# Patient Record
Sex: Female | Born: 1935 | Race: White | Hispanic: No | Marital: Married | Smoking: Never smoker
Health system: Southern US, Community
[De-identification: ages and names within clinical notes are randomized; demographics above are authoritative.]

## PROBLEM LIST (undated history)

## (undated) DIAGNOSIS — I10 Essential (primary) hypertension: Secondary | ICD-10-CM

## (undated) DIAGNOSIS — R7303 Prediabetes: Secondary | ICD-10-CM

## (undated) DIAGNOSIS — K5792 Diverticulitis of intestine, part unspecified, without perforation or abscess without bleeding: Secondary | ICD-10-CM

## (undated) DIAGNOSIS — M51369 Other intervertebral disc degeneration, lumbar region without mention of lumbar back pain or lower extremity pain: Secondary | ICD-10-CM

## (undated) DIAGNOSIS — L920 Granuloma annulare: Secondary | ICD-10-CM

## (undated) DIAGNOSIS — M810 Age-related osteoporosis without current pathological fracture: Secondary | ICD-10-CM

## (undated) DIAGNOSIS — M359 Systemic involvement of connective tissue, unspecified: Secondary | ICD-10-CM

## (undated) DIAGNOSIS — M5136 Other intervertebral disc degeneration, lumbar region: Secondary | ICD-10-CM

## (undated) HISTORY — PX: BACK SURGERY: SHX140

## (undated) HISTORY — PX: ABDOMINAL HYSTERECTOMY: SHX81

---

## 2008-11-04 ENCOUNTER — Encounter: Payer: Self-pay | Admitting: Family Medicine

## 2008-12-03 ENCOUNTER — Encounter: Payer: Self-pay | Admitting: Family Medicine

## 2009-01-02 ENCOUNTER — Encounter: Payer: Self-pay | Admitting: Family Medicine

## 2014-06-10 ENCOUNTER — Inpatient Hospital Stay: Payer: Self-pay | Admitting: Internal Medicine

## 2014-06-26 ENCOUNTER — Ambulatory Visit: Payer: Self-pay | Admitting: Gastroenterology

## 2014-08-03 NOTE — Consult Note (Signed)
PATIENT NAME:  Janet Duffy, Janet Duffy MR#:  [id number] DATE OF BIRTH:  04-14-1931  DATE OF CONSULTATION:  06/07/2014  CONSULTING PHYSICIAN:  Lollie Sails, MD  REASON FOR CONSULTATION: Rectal bleeding.   HISTORY OF PRESENT ILLNESS: Janet Duffy is an 81 year old Caucasian female who was in her usual state of health until about 10:30 p.m. last night. At that point she went to the restroom and had an episode of rectal bleeding. This was followed by several further episodes. Her last bowel movement was about 12:30 this morning, therefore, a little past midnight. She states that the rectal bleeding was bright red. She denies any nausea or vomiting. She does get some occasional heartburn. There is no dysphagia. She states that she has had some off and on lower right abdominal discomfort for about three weeks. She stated that she had no severe abdominal discomfort prior to the bleeding beginning, however, has had the same lower abdominal discomfort.  She states her bowel movements are usually twice a day. She has had some bleeding with a bowel movement in the past in regards to her history of hemorrhoids. Indeed, she underwent a flexible sigmoidoscopy about five months ago then followed by another about three weeks ago and at that time did undergo internal hemorrhoidal banding. She stated she had no bleeding after the procedure. The first bleeding was this yesterday. She also has had a full colonoscopy in the past. The last one being about three years ago, this showing some colon polyps as well as diverticulosis.   The patient also has a history of atrial fibrillation; however, does not take any blood thinner with the exception of an 81 mg aspirin. She states, however, this was discontinued several days prior to her hemorrhoidal banding and she has not restarted it. She takes a Prilosec occasionally. 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 She states that in the past she has had two blood clots; these in her fingers and occurring many years ago. She further relates having some aortic disease, but could not elucidate that further. She has been hemodynamically stable and has had no bowel movement or evidence of ongoing bleeding since a little past midnight, that being over 16 hours ago.   GASTROINTESTINAL FAMILY HISTORY: Pertinent for a brother with colon cancer. Negative for liver disease or ulcers.   PAST MEDICAL HISTORY:  1.  History of aortic insufficiency.  2.  Osteopenia.  3.  Hypertension.  4.  Osteoarthritis.  5.  PAT.  6.  Diverticulosis.  7.  Hysterectomy.  8.  Femur surgery.  9.  Cataract surgery.  10.  Kyphoplasty.   OUTPATIENT MEDICATIONS: Include the following:  1.  Aspirin 81 mg aspirin once a day, please note above.  2.  Carvedilol 12.5 mg twice a day.  3.  Chlorthalidone 25 mg 1/2 tablet once a day as needed for swelling.  4.  Felodipine 2.5 mg ER once a day.  5.  Lisinopril 20 mg daily.  6.  Metronidazole topical gel.  7.  Multiple vitamin.  8.  Nitroglycerin 2% transdermal ointment to be used on the affected fingers twice a day for Raynauds syndrome.  9.  Omeprazole 20 mg twice a day.  10.  Ondansetron 4 mg twice a day before meals.  11.  Potassium chloride 20 mEq once a day.  12.  Psyllium fiber 1 capsule daily.   PHYSICAL EXAMINATION:  VITAL SIGNS:  Temperature 97.8,  pulse 77, respirations 18, blood pressure 158/81, pulse ox 94%.  GENERAL: She is an 81 year old Caucasian female no acute distress.  HEENT: Normocephalic, atraumatic.  EYES: Anicteric.  NOSE: Septum midline. No lesions.  OROPHARYNX: No lesions.  NECK: No JVD.  HEART: Regular rate and rhythm.  LUNGS: Clear.  ABDOMEN: Soft. There is some mild tenderness across the lower abdomen, mostly in the right lower quadrant. There are no masses, rebound, or organomegaly.  RECTAL: Anorectal exam deferred.  EXTREMITIES: No clubbing,  cyanosis, or edema.  NEUROLOGICAL: Cranial nerves II through XII grossly intact. Muscle strength bilaterally equal and symmetric.   LABORATORY DIAGNOSTIC AND RADIOLOGICAL DATA:  Include the following:  Late last night metabolic panel showing glucose of 153, otherwise normal. Hepatic profile showing a total protein of 8, albumin 2.8, total bilirubin 0.1, alkaline phosphatase 85, AST 24, ALT 13. Her hemogram last night showing a white count of 4.4, H and H 10.0/30.2, platelet count 215. MCV is 85. She has had repeat hemoglobins done at 7:55 this morning and at 1600 hours this afternoon, these being 8.4 and 8.9 respectively. Her INR was 1.  She has had no imaging studies.   ASSESSMENT:  1.  Hematochezia/bright red rectal bleeding. Patient with recent hemorrhoidal banding. She has had no recurrent bleeding for approximately 17 hours at this point.  2.  Differential diagnosis would include recurrent anal outlet bleeding from hemorrhoids, diverticular bleeding in the setting of known history of diverticulosis, possible episode of ischemic colitis. Please note histories as above.   RECOMMENDATION:  1.  Continue serial hemoglobins. Transfuse as needed.  2.  If patient has another episode of acute bleeding, would obtain a stat GI bleeding scan. This should easily differentiate between diverticular versus anal/rectal bleeding. If there is evidence of the bleeding being diverticular in nature would then obtain a vascular surgery consult.  3.  May need to consider flexible sigmoidoscopy prior to discharge from the hospital or within the next couple of weeks.  4.  Would consider further evaluations in regards to her history of lupus like syndrome and Raynauds to include anticardiolipin antibodies, protein C and S panel, antithrombin III as well as Leiden factor V.     ____________________________ Lollie Sails, MD mus:at D: 06/07/2014 17:31:37 ET T: 06/07/2014 17:43:17 ET JOB#: [id number]  cc: Lollie Sails, MD, <Dictator> Lollie Sails MD ELECTRONICALLY SIGNED 07/15/2014 14:25

## 2014-08-03 NOTE — Consult Note (Signed)
Chief Complaint:  Subjective/Chief Complaint Patietn discussed with Dr Earleen Newport.  Patietn with another bloody stool overnight and a small drop og hgb.  Patient with syncopal eposode this am with enema.  Recommend holding the plans for flex sig for now, if patietn has another bleeding episode, recommend bleeding scan.   Electronic Signatures: Loistine Simas (MD)  (Signed 07-Mar-16 09:52)  Authored: Chief Complaint   Last Updated: 07-Mar-16 09:52 by Loistine Simas (MD)

## 2014-08-03 NOTE — Consult Note (Signed)
Chief Complaint:  Subjective/Chief Complaint small bm this am, old blood.  no repeat today. continues with mild lower abdominal discomfort.  no nausea of emesis.   VITAL SIGNS/ANCILLARY NOTES: **Vital Signs.:   06-Mar-16 07:34  Vital Signs Type Q 8hr  Temperature Temperature (F) 98.1  Temperature Source oral  Pulse Pulse 80  Respirations Respirations 17  Systolic BP Systolic BP 094  Diastolic BP (mmHg) Diastolic BP (mmHg) 74  Mean BP 98  Pulse Ox % Pulse Ox % 96  Pulse Ox Activity Level  At rest  Oxygen Delivery Room Air/ 21 %   Brief Assessment:  Cardiac Regular   Respiratory clear BS   Gastrointestinal details normal Soft  Nondistended  No masses palpable  Bowel sounds normal  No rebound tenderness  tender to palpation across the lower abdomen, mostly rlq   Lab Results: Routine Chem:  06-Mar-16 04:14   BUN 11  Creatinine (comp) 0.92  Sodium, Serum 142  Potassium, Serum  3.4  Chloride, Serum 105  CO2, Serum 27  Calcium (Total), Serum 8.5  Anion Gap 10  Osmolality (calc) 283  eGFR (African American) >60  eGFR (Non-African American) >60 (eGFR values <12m/min/1.73 m2 may be an indication of chronic kidney disease (CKD). Calculated eGFR, using the MRDR Study equation, is useful in  patients with stable renal function. The eGFR calculation will not be reliable in acutely ill patients when serum creatinine is changing rapidly. It is not useful in patients on dialysis. The eGFR calculation may not be applicable to patients at the low and high extremes of body sizes, pregnant women, and vegetarians.)  Routine Hem:  06-Mar-16 04:14   WBC (CBC)  2.9  RBC (CBC)  3.14  Hemoglobin (CBC)  8.8  Hematocrit (CBC)  26.6  Platelet Count (CBC) 179  MCV 85  MCH 28.0  MCHC 33.1  RDW  16.0  Neutrophil % 39.7  Lymphocyte % 40.1  Monocyte % 15.0  Eosinophil % 3.6  Basophil % 1.6  Neutrophil #  1.2  Lymphocyte # 1.2  Monocyte # 0.4  Eosinophil # 0.1  Basophil # 0.0  (Result(s) reported on 08 Jun 2014 at 05:16AM.)   Assessment/Plan:  Assessment/Plan:  Assessment 1) hematochezia, lower abdominal pain/discomfort.  stable, no recurrent bleeding.  DDx- hemorrhoidal, diverticular, possible ischemic colitis-note pain before episode.   Plan 1) continue to monitor through tomorrow, may do flexible sigmoidoscopy tomorrow depending on clinical status.   Continue current.   Electronic Signatures: SLoistine Simas(MD)  (Signed 06-Mar-16 17:00)  Authored: Chief Complaint, VITAL SIGNS/ANCILLARY NOTES, Brief Assessment, Lab Results, Assessment/Plan   Last Updated: 06-Mar-16 17:00 by SLoistine Simas(MD)

## 2014-08-03 NOTE — H&P (Signed)
PATIENT NAME:  Janet Duffy, Janet Duffy MR#:  [id number] DATE OF BIRTH:  1931/10/06  DATE OF ADMISSION:  06/07/2014  CHIEF COMPLAINT: Rectal bleed.   HISTORY OF PRESENT ILLNESS: This is an 81 year old female with minimal significant medical history who presents to the ED after a significant amount of bright red blood per rectum is noted. The patient states that throughout the day today she has had 3 stools with blood in them, 2 of them were large volume at home and a third smaller volume one was here in the ED. The patient states that her last regular bowel movement was the morning of admission, but that subsequent to that she felt a strong desire to void and went to the bathroom and had large volume of bright red blood per rectum that she felt like came out in bolus from her rectum. The patient states that she does have some mild abdominal pain sort of diffusely today, but she denies any melena. Denies any nausea or vomiting. States that she has not recently use any nonsteroidal anti-inflammatories or steroids, and that she has had is dull, constant right upper quadrant pain for a couple of weeks, which has slowly been improving. The patient also stopped taking her Prilosec no too long ago. In the ED,  the patient was found to be mildly anemic with a hemoglobin of 10. Electrolytes and blood work were otherwise relatively benign. The patient was typed and screened. Her INR was 1.0. Hospitalists were called for admission with the plan to admit the patient and have GI following and consult for the GI bleed.   PRIMARY CARE PHYSICIAN: Nonlocal.   PAST MEDICAL HISTORY: Includes hemorrhoids for which she states that she had a procedure done 3 weeks ago at [facility] for hemorrhoid banding. Past medical history also includes paroxysmal atrial fibrillation, aortic insufficiency, osteopenia, hypertension, osteoarthritis, and diverticulosis.   CURRENT MEDICATIONS: Potassium 20 mEq, Zofran 4 mg q. 6-8 hours p.r.n., lisinopril 20 mg  daily, felodipine 2.5 mg daily, chlorthalidone 12.5 mg daily, Coreg 12.5 mg b.i.d., aspirin 81 mg daily.   PAST SURGICAL HISTORY: Hysterectomy, femur surgery, cataract removal, kyphoplasty.   ALLERGIES: AVELOX, ERYTHROMYCIN, PENICILLIN, CLINDAMYCIN, SEAFOOD, CODEINE, ZOSTAVAX, NORVASC.   FAMILY HISTORY: Includes hypertension, cancer.   SOCIAL HISTORY: The patient is a nonsmoker, nondrinker. Denies any illicit drug use.   REVIEW OF SYSTEMS:  CONSTITUTIONAL: The patient denies fever, fatigue, weakness.  EYES: The patient denies blurred or double vision, pain or redness.  EAR, NOSE, AND THROAT: Denies hearing loss, ear pain, difficulty swallowing.  RESPIRATORY: Denies cough, wheeze, dyspnea.  CARDIOVASCULAR: Denies chest pain or palpitations.  GASTROINTESTINAL: The patient endorses abdominal pain and bright red blood per rectum. Denies nausea, vomiting, and GERD.  GENITOURINARY: The patient denies dysuria, hematuria, or frequency.  HEMATOLOGIC AND LYMPHATIC: The patient denies easy bruising, easy bleeding, and swollen glands.  INTEGUMENTARY: The patient denies acne, rash, lesion.  MUSCULOSKELETAL: The patient denies arthritis, swelling, and gout.  NEUROLOGICAL: The patient denies numbness, weakness, and headache.  PSYCHIATRIC: The patient denies anxiety, insomnia, and depression.   PHYSICAL EXAMINATION:   VITAL SIGNS: Blood pressure 149/78, pulse 86, temperature 97.1, respirations 18, 95% on room air.  HEENT: Pupils equal, round, and reactive to light and accommodation. Extraocular movements intact. No scleral icterus. Moist mucosal membranes.  NECK: Thyroid is normal and not enlarged. Neck is supple with no masses, nontender. Right adenopathy. No JVD noted. No cervical adenopathy.  RESPIRATORY: Clear to auscultation bilaterally with no rales, rhonchi, or wheezing.  CARDIOVASCULAR: Regular rate and rhythm. No murmur, rub, or gallop detected on exam. Good pedal pulses. No lower extremity  edema.  ABDOMEN: Soft with mild tenderness to palpation in the right upper quadrant, nondistended with good bowel sounds. No hepatosplenomegaly.  MUSCULOSKELETAL: Muscular strength 5/5 throughout. Full spontaneous range of motion throughout. No peripheral cyanosis or clubbing.  SKIN: No rash or lesions. Skin is warm, dry, and intact.  LYMPHATIC: No adenopathy.  NEUROLOGICAL: Cranial nerves intact. Sensation intact throughout. No dysarthria or aphasia.  PSYCHIATRIC: The patient is alert and oriented x 3, cooperative, shows good insight and judgment.   LABORATORY DATA: White blood count 4.4, hemoglobin 10, hematocrit 30.2, platelet 215,000. Sodium 140, potassium 3.6, chloride 103, bicarbonate 28, BUN 14, creatinine 0.85, glucose 153. Total protein 8.0, serum albumin 2.8, total bilirubin 0.1, alkaline phosphatase 85, AST 24, ALT 13. INR was 1.   RADIOLOGY: No radiographic imaging performed at this time.   ASSESSMENT AND PLAN:  1.  Gastrointestinal bleed: Unclear etiology at this time. Bright red blood per rectum seems to suggest lower gastrointestinal bleed. The patient does have a known history of diverticulosis; however, it is unclear at this time exactly where the bleed coming from. We will start patient on intravenous proton pump inhibitor drip, get a gastroenterology consult for the morning. This patient will be kept n.p.o. for now.  2.  Paroxysmal atrial fibrillation: The patient is currently rate controlled with the medicine that she is on. We will continue these medications for rate control.  3.  Hypertension: This problem is stable. We will continue current home medications  4.  Deep vein thrombosis prophylaxis: With sequential compression devices.   CODE STATUS: This patient is full code.   TIME SPENT ON THIS ADMISSION: 45 minutes.    ____________________________ Wilford Corner. Jannifer Franklin, MD dfw:bm D: 06/07/2014 03:54:44 ET T: 06/07/2014 04:42:36 ET JOB#: [id number]  cc: Wilford Corner. Jannifer Franklin, MD,  <Dictator> Libbie Bartley Fawn Kirk MD ELECTRONICALLY SIGNED 06/07/2014 20:42

## 2014-08-03 NOTE — Consult Note (Signed)
Chief Complaint:  Subjective/Chief Complaint no further rectal bleeding, mild lower abdominal discomfort continuing, though improved.  no nausea, tolerating po without nausea or increased pain.   VITAL SIGNS/ANCILLARY NOTES: **Vital Signs.:   08-Mar-16 15:44  Vital Signs Type Q 8hr  Celsius 36.8  Temperature Source oral  Pulse Pulse 73  Respirations Respirations 18  Systolic BP Systolic BP 697  Diastolic BP (mmHg) Diastolic BP (mmHg) 74  Mean BP 93  Pulse Ox % Pulse Ox % 96  Pulse Ox Activity Level  At rest  Oxygen Delivery Room Air/ 21 %   Brief Assessment:  Cardiac Regular   Respiratory clear BS   Gastrointestinal details normal Soft  Nondistended  No masses palpable  Bowel sounds normal  No rebound tenderness  mild tenderness to palpation across the lower abdomen, mostly llq.   Lab Results: Routine BB:  08-Mar-16 09:15   ABO Group + Rh Type O Positive  Antibody Screen NEGATIVE (Result(s) reported on 10 Jun 2014 at 10:22AM.)  Crossmatch Unit 1 Issued (Result(s) reported on 10 Jun 2014 at 11:30AM.)  Routine Hem:  04-Mar-16 23:58   Hemoglobin (CBC)  10.0  05-Mar-16 07:55   Hemoglobin (CBC)  8.4 (Result(s) reported on 07 Jun 2014 at 08:15AM.)    16:00   Hemoglobin (CBC)  8.9 (Result(s) reported on 07 Jun 2014 at 05:04PM.)  06-Mar-16 04:14   Hemoglobin (CBC)  8.8  07-Mar-16 04:27   Hemoglobin (CBC)  8.2 (Result(s) reported on 09 Jun 2014 at 05:51AM.)  08-Mar-16 04:09   WBC (CBC) 5.6  RBC (CBC)  2.83  Hemoglobin (CBC)  7.8  Hematocrit (CBC)  23.9  Platelet Count (CBC) 181  MCV 85  MCH 27.4  MCHC 32.4  RDW  16.2  Neutrophil % 62.9  Lymphocyte % 22.8  Monocyte % 11.9  Eosinophil % 1.8  Basophil % 0.6  Neutrophil # 3.5  Lymphocyte # 1.3  Monocyte # 0.7  Eosinophil # 0.1  Basophil # 0.0 (Result(s) reported on 10 Jun 2014 at 05:50AM.)   Assessment/Plan:  Assessment/Plan:  Assessment 1) rectal bleeding-resolved. 2) lower abdominal pain improving.  recommend  ct with contrast.  this can be done in o/p GI followup.  3) recent hemorrhoidal banding, has had 2 flexible sig in the past 6 months and last full colonoscopy was 3 and a half years ago, with recommendation to repeat in 5 years.   Plan as above.   Electronic Signatures: Loistine Simas (MD)  (Signed 08-Mar-16 19:32)  Authored: Chief Complaint, VITAL SIGNS/ANCILLARY NOTES, Brief Assessment, Lab Results, Assessment/Plan   Last Updated: 08-Mar-16 19:32 by Loistine Simas (MD)

## 2014-08-03 NOTE — Discharge Summary (Signed)
PATIENT NAME:  Janet Duffy, SUTPHIN MR#:  [id number] DATE OF BIRTH:  1931/11/25  DATE OF ADMISSION:  06/10/2014 DATE OF DISCHARGE:  06/11/2014  ADMITTING DIAGNOSIS: Gastrointestinal bleed.  DISCHARGE DIAGNOSES: 1.  Acute posthemorrhagic anemia, status post 1 unit of packed red blood cell transfusion.  2.  Lower gastrointestinal bleed, suspected diverticular.  3.  Syncope with enema. 4.  History of essential hypertension. 5.  Paroxysmal atrial fibrillation. 6.  Aortic regurgitation.  7.  Osteopenia. 8.  Osteoarthritis. 9.  Diverticulosis. 10.  Status post recent hemorrhoidal banding.   DISCHARGE CONDITION: Stable.   DISCHARGE MEDICATIONS:  1.  The patient is to continue Coreg 12.5 mg twice daily.  2.  Psyllium oral capsule, unknown dose, once daily. 3.  Lisinopril 10 mg p.o. daily. 4.  Metronidazole topical gel 0.75% to effected area as needed.  5.  Multivitamins once daily. 6.  Nitroglycerin transdermal ointment 2% on affected fingers twice daily for Raynaud's.   7.  Omeprazole 10 mg p.o. twice daily.  8.  Zofran 4 mg every 6 to 8 hours as needed.  9.  Felodipine extended-release 2.5 mg once daily.  10.  Senna 1 tablet twice daily disease as needed.  11.  Docusate sodium 100 mg twice daily as needed.  12.  Fergon 240 mg twice daily.   The patient is not to take aspirin, chlorthalidone, or potassium supplements unless recommended by primary care physician.   HOME OXYGEN: None.   DIET: Two gram salt, regular consistency.   ACTIVITY LIMITATIONS: As tolerated.    FOLLOWUP APPOINTMENT: With Dr. Almeta Monas at [facility] in 1 week after discharge. Dr. Gustavo Lah in 2 weeks after discharge.   CONSULTANTS: Care management, social work, Dr. Gustavo Lah.   RADIOLOGIC STUDIES: None.   HOSPITAL COURSE: The patient is an 81 year old Caucasian female with past medical history significant for history of diverticular disease, who presents to the hospital with complaints of rectal bleed. Please refer to  Dr. Tobey Grim admission note on 06/07/2014. On arrival to the hospital, the patient's temperature was 97.1, her pulse was 86, respiration rate was 18, blood pressure 149/78, saturation was 95% on room air. Physical examination revealed mild tenderness to palpation in the right upper quadrant but no other abnormalities were found. The patient's laboratory data done on arrival to the Emergency Room showed elevation of glucose of 153, otherwise BMP was normal. Liver enzymes revealed albumin level of 2.8. A total bilirubin was 0.1, otherwise liver enzymes were normal. CBC within normal limits with white blood cell count 4.4, hemoglobin 10.0, platelet count 215,000. Coagulation panel was unremarkable with pro time 13.3 and INR 1.0. EKG showed normal sinus rhythm at 98 beats a minute, nonspecific ST-T changes. The patient was admitted to the hospital for further evaluation. Consultation with Dr. Gustavo Lah, gastroenterologist, was obtained. Dr. Gustavo Lah saw the patient in consultation the same day, 06/07/2014. He felt that the patient had hematochezia, and she is hemodynamically stable with no recurrence for the past 16 to 17 hours. Differential diagnosis according to Dr. Gustavo Lah was anorectal bleeding from hemorrhoids, or less likely diverticular disease. Also, some possibility of ischemic colitis. Recommended to get bleeding scan if bleeding recurs and follow hemoglobin levels closely. The patient was admitted to the hospital for further evaluation. She was given IV fluids and her hemoglobin level was followed along. With rehydration however, the patient's hemoglobin level drifted down to 8.2 on 06/09/2014 and even as low at 7.8 on 06/10/2014. At that point, the patient was transfused with 1 unit  of packed red blood cells, since she felt quite poorly, weak, and uncomfortable. After transfusion, her hemoglobin level improved to 9.0. The patient was advised to continue iron supplements and follow her hemoglobin levels as  outpatient. The patient was evaluated by gastroenterologist, as mentioned above, and recommended flexible sigmoidoscopy. Unfortunately with enema, the patient developed a syncopal episode and her study was postponed to outpatient The patient is to follow up with her gastroenterologist in the next 1 to 2 weeks after discharge for further recommendations and follow her hemoglobin level as an outpatient. She is to continue iron supplementation and hold aspirin until recommended by primary care physician.   In regard to her chronic medical problems such as essential hypertension, paroxysmal atrial fibrillation, osteopenia, osteoarthritis, the patient is to continue her outpatient management. No changes were made.   The patient is being discharged in stable condition with the above-mentioned medications and followup. On the day of discharge, temperature was 98.3, pulse was 74, respiration rate was 18, blood pressure 153/74, saturation 93% to 95% on room air at rest.   TIME SPENT: Forty minutes.    ____________________________ Theodoro Grist, MD rv:TM D: 06/11/2014 16:51:00 ET T: 06/11/2014 23:59:46 ET JOB#: [id number]  cc: Almeta Monas, MD Lollie Sails, MD Theodoro Grist, MD, <Dictator> [place] MD ELECTRONICALLY SIGNED 06/18/2014 14:18

## 2014-08-03 NOTE — Consult Note (Signed)
Brief Consult Note: Diagnosis: hematochezia.   Patient was seen by consultant.   Consult note dictated.   Recommend further assessment or treatment.   Comments: Please see full GI copnsutl #[id number].  Patient presenting with rectal bleeding .  Hemodynamically stable, not recurrent for about 16-17 hours.  DDx includes anorectal bleedign a/w hemorhoids or treatment thereof, though less likely, diverticular, also some possibility of ischemic colitis (note history).  Recommend stat bleeding scan if recurrent bleeding occurs. Serial hgb, tfx as needed, following.  Electronic Signatures: Loistine Simas (MD)  (Signed 05-Mar-16 17:34)  Authored: Brief Consult Note   Last Updated: 05-Mar-16 17:34 by Loistine Simas (MD)

## 2014-08-03 NOTE — Consult Note (Signed)
Chief Complaint:  Subjective/Chief Complaint events noted, hemodynamically stable, bm after enema not bloody times 3.  less abdominal discomfort. feels weak.   VITAL SIGNS/ANCILLARY NOTES: **Vital Signs.:   07-Mar-16 07:49  Vital Signs Type Q 8hr  Temperature Temperature (F) 98.2  Celsius 36.7  Temperature Source oral  Pulse Pulse 76  Respirations Respirations 17  Systolic BP Systolic BP 646  Diastolic BP (mmHg) Diastolic BP (mmHg) 74  Mean BP 100  Pulse Ox % Pulse Ox % 96  Pulse Ox Activity Level  At rest  Oxygen Delivery Room Air/ 21 %    07:55  Pulse Pulse 98  Respirations Respirations 20  Systolic BP Systolic BP 803  Diastolic BP (mmHg) Diastolic BP (mmHg) 81  Mean BP 113  Pulse Ox % Pulse Ox % 100  Pulse Ox Activity Level  At rest  Oxygen Delivery Room Air/ 21 %  *Intake and Output.:   07-Mar-16 10:16  Stool  large soft formed brown bm with some liquid x3   Brief Assessment:  Cardiac Regular   Respiratory clear BS   Gastrointestinal details normal Soft  Nondistended  Bowel sounds normal  No rebound tenderness  mild lower abdominal tenderness   Lab Results: Routine Chem:  04-Mar-16 23:58   BUN 14  06-Mar-16 04:14   BUN 11  Routine Coag:  04-Mar-16 23:58   INR 1.0 (INR reference interval applies to patients on anticoagulant therapy. A single INR therapeutic range for coumarins is not optimal for all indications; however, the suggested range for most indications is 2.0 - 3.0. Exceptions to the INR Reference Range may include: Prosthetic heart valves, acute myocardial infarction, prevention of myocardial infarction, and combinations of aspirin and anticoagulant. The need for a higher or lower target INR must be assessed individually. Reference: The Pharmacology and Management of the Vitamin K  antagonists: the seventh ACCP Conference on Antithrombotic and Thrombolytic Therapy. OZYYQ.8250 Sept:126 (3suppl): [id number]. A HCT value >55% may artifactually  increase the PT.  In one study,  the increase was an average of 25%. Reference:  "Effect on Routine and Special Coagulation Testing Values of Citrate Anticoagulant Adjustment in Patients with High HCT Values." American Journal of Clinical Pathology 2006;126:400-405.)  Routine Hem:  04-Mar-16 23:58   Hemoglobin (CBC)  10.0  05-Mar-16 07:55   Hemoglobin (CBC)  8.4 (Result(s) reported on 07 Jun 2014 at 08:15AM.)    16:00   Hemoglobin (CBC)  8.9 (Result(s) reported on 07 Jun 2014 at 05:04PM.)  06-Mar-16 04:14   Hemoglobin (CBC)  8.8  07-Mar-16 04:27   Hemoglobin (CBC)  8.2 (Result(s) reported on 09 Jun 2014 at 05:51AM.)   Assessment/Plan:  Assessment/Plan:  Assessment 1) rectal bleeding-flex sig held this am due to marked syncopal episode, holding for now.  no repeat bleeding since yesterday.  ddx, diverticular vs anal outlet ( recent hemorrhoidal banding), versus ischemic.   Plan 1) continue current, discussed with Dr Earleen Newport.   Electronic Signatures: Loistine Simas (MD)  (Signed 07-Mar-16 15:44)  Authored: Chief Complaint, VITAL SIGNS/ANCILLARY NOTES, Brief Assessment, Lab Results, Assessment/Plan   Last Updated: 07-Mar-16 15:44 by Loistine Simas (MD)

## 2016-06-18 ENCOUNTER — Encounter: Payer: Self-pay | Admitting: Emergency Medicine

## 2016-06-18 ENCOUNTER — Emergency Department: Payer: MEDICARE

## 2016-06-18 ENCOUNTER — Inpatient Hospital Stay
Admission: EM | Admit: 2016-06-18 | Discharge: 2016-06-21 | DRG: 392 | Disposition: A | Discharge status: Home / Self Care | Payer: MEDICARE | Attending: Internal Medicine | Admitting: Internal Medicine

## 2016-06-18 DIAGNOSIS — Z79899 Other long term (current) drug therapy: Secondary | ICD-10-CM | POA: Diagnosis not present

## 2016-06-18 DIAGNOSIS — Z885 Allergy status to narcotic agent status: Secondary | ICD-10-CM | POA: Diagnosis not present

## 2016-06-18 DIAGNOSIS — K219 Gastro-esophageal reflux disease without esophagitis: Secondary | ICD-10-CM | POA: Diagnosis not present

## 2016-06-18 DIAGNOSIS — Z90710 Acquired absence of both cervix and uterus: Secondary | ICD-10-CM

## 2016-06-18 DIAGNOSIS — I480 Paroxysmal atrial fibrillation: Secondary | ICD-10-CM | POA: Diagnosis not present

## 2016-06-18 DIAGNOSIS — Z91041 Radiographic dye allergy status: Secondary | ICD-10-CM

## 2016-06-18 DIAGNOSIS — K5900 Constipation, unspecified: Secondary | ICD-10-CM | POA: Diagnosis not present

## 2016-06-18 DIAGNOSIS — Z9102 Food additives allergy status: Secondary | ICD-10-CM

## 2016-06-18 DIAGNOSIS — Z887 Allergy status to serum and vaccine status: Secondary | ICD-10-CM

## 2016-06-18 DIAGNOSIS — I10 Essential (primary) hypertension: Secondary | ICD-10-CM | POA: Diagnosis not present

## 2016-06-18 DIAGNOSIS — K5732 Diverticulitis of large intestine without perforation or abscess without bleeding: Secondary | ICD-10-CM | POA: Diagnosis not present

## 2016-06-18 DIAGNOSIS — R55 Syncope and collapse: Secondary | ICD-10-CM | POA: Diagnosis present

## 2016-06-18 DIAGNOSIS — D649 Anemia, unspecified: Secondary | ICD-10-CM | POA: Diagnosis not present

## 2016-06-18 DIAGNOSIS — Z888 Allergy status to other drugs, medicaments and biological substances status: Secondary | ICD-10-CM

## 2016-06-18 DIAGNOSIS — M810 Age-related osteoporosis without current pathological fracture: Secondary | ICD-10-CM | POA: Diagnosis present

## 2016-06-18 DIAGNOSIS — Z91013 Allergy to seafood: Secondary | ICD-10-CM

## 2016-06-18 DIAGNOSIS — R1032 Left lower quadrant pain: Secondary | ICD-10-CM | POA: Diagnosis present

## 2016-06-18 DIAGNOSIS — I959 Hypotension, unspecified: Secondary | ICD-10-CM | POA: Diagnosis present

## 2016-06-18 DIAGNOSIS — Z8249 Family history of ischemic heart disease and other diseases of the circulatory system: Secondary | ICD-10-CM

## 2016-06-18 DIAGNOSIS — K5792 Diverticulitis of intestine, part unspecified, without perforation or abscess without bleeding: Secondary | ICD-10-CM | POA: Diagnosis present

## 2016-06-18 DIAGNOSIS — Z881 Allergy status to other antibiotic agents status: Secondary | ICD-10-CM | POA: Diagnosis not present

## 2016-06-18 DIAGNOSIS — Z833 Family history of diabetes mellitus: Secondary | ICD-10-CM

## 2016-06-18 HISTORY — DX: Other intervertebral disc degeneration, lumbar region without mention of lumbar back pain or lower extremity pain: M51.369

## 2016-06-18 HISTORY — DX: Age-related osteoporosis without current pathological fracture: M81.0

## 2016-06-18 HISTORY — DX: Granuloma annulare: L92.0

## 2016-06-18 HISTORY — DX: Other intervertebral disc degeneration, lumbar region: M51.36

## 2016-06-18 HISTORY — DX: Systemic involvement of connective tissue, unspecified: M35.9

## 2016-06-18 HISTORY — DX: Essential (primary) hypertension: I10

## 2016-06-18 LAB — URINALYSIS, COMPLETE (UACMP) WITH MICROSCOPIC
Bacteria, UA: NONE SEEN
Bilirubin Urine: NEGATIVE
GLUCOSE, UA: NEGATIVE mg/dL
Hgb urine dipstick: NEGATIVE
KETONES UR: 5 mg/dL — AB
Leukocytes, UA: NEGATIVE
NITRITE: NEGATIVE
PROTEIN: NEGATIVE mg/dL
Specific Gravity, Urine: 1.006 (ref 1.005–1.030)
Squamous Epithelial / HPF: NONE SEEN
pH: 9 — ABNORMAL HIGH (ref 5.0–8.0)

## 2016-06-18 LAB — COMPREHENSIVE METABOLIC PANEL
ALT: 11 U/L — ABNORMAL LOW (ref 14–54)
AST: 21 U/L (ref 15–41)
Albumin: 3.3 g/dL — ABNORMAL LOW (ref 3.5–5.0)
Alkaline Phosphatase: 52 U/L (ref 38–126)
Anion gap: 7 (ref 5–15)
BUN: 10 mg/dL (ref 6–20)
CO2: 25 mmol/L (ref 22–32)
Calcium: 9.7 mg/dL (ref 8.9–10.3)
Chloride: 103 mmol/L (ref 101–111)
Creatinine, Ser: 0.95 mg/dL (ref 0.44–1.00)
GFR calc non Af Amer: 54 mL/min — ABNORMAL LOW (ref >60)
Glucose, Bld: 117 mg/dL — ABNORMAL HIGH (ref 65–99)
POTASSIUM: 3.9 mmol/L (ref 3.5–5.1)
Sodium: 135 mmol/L (ref 135–145)
Total Bilirubin: 0.6 mg/dL (ref 0.3–1.2)
Total Protein: 8.9 g/dL — ABNORMAL HIGH (ref 6.5–8.1)

## 2016-06-18 LAB — LIPASE, BLOOD: LIPASE: 29 U/L (ref 11–51)

## 2016-06-18 LAB — CBC
HCT: 32.2 % — ABNORMAL LOW (ref 35.0–47.0)
HEMOGLOBIN: 11 g/dL — AB (ref 12.0–16.0)
MCH: 25.9 pg — ABNORMAL LOW (ref 26.0–34.0)
MCHC: 34 g/dL (ref 32.0–36.0)
MCV: 76.2 fL — ABNORMAL LOW (ref 80.0–100.0)
PLATELETS: 203 10*3/uL (ref 150–440)
RBC: 4.23 MIL/uL (ref 3.80–5.20)
RDW: 20.2 % — ABNORMAL HIGH (ref 11.5–14.5)
WBC: 5.4 10*3/uL (ref 3.6–11.0)

## 2016-06-18 MED ORDER — HYDRALAZINE HCL 50 MG PO TABS
50.0000 mg | ORAL_TABLET | Freq: Three times a day (TID) | ORAL | Status: DC
  Administered 2016-06-19 – 2016-06-21 (×7): 50 mg via ORAL
  Filled 2016-06-18 (×7): qty 1

## 2016-06-18 MED ORDER — CIPROFLOXACIN IN D5W 400 MG/200ML IV SOLN
400.0000 mg | Freq: Two times a day (BID) | INTRAVENOUS | Status: DC
  Administered 2016-06-19 (×3): 400 mg via INTRAVENOUS
  Filled 2016-06-18 (×6): qty 200

## 2016-06-18 MED ORDER — SPIRONOLACTONE 25 MG PO TABS
25.0000 mg | ORAL_TABLET | Freq: Every day | ORAL | Status: DC
  Administered 2016-06-19 – 2016-06-21 (×3): 25 mg via ORAL
  Filled 2016-06-18 (×3): qty 1

## 2016-06-18 MED ORDER — SODIUM CHLORIDE 0.9 % IV BOLUS (SEPSIS)
1000.0000 mL | Freq: Once | INTRAVENOUS | Status: AC
  Administered 2016-06-18: 1000 mL via INTRAVENOUS

## 2016-06-18 MED ORDER — FENTANYL CITRATE (PF) 100 MCG/2ML IJ SOLN
75.0000 ug | Freq: Once | INTRAMUSCULAR | Status: AC
  Administered 2016-06-18: 75 ug via INTRAVENOUS
  Filled 2016-06-18: qty 2

## 2016-06-18 MED ORDER — BACID PO TABS
2.0000 | ORAL_TABLET | Freq: Three times a day (TID) | ORAL | Status: DC
  Administered 2016-06-19: 2 via ORAL
  Filled 2016-06-18 (×3): qty 2

## 2016-06-18 MED ORDER — ONDANSETRON HCL 4 MG PO TABS
4.0000 mg | ORAL_TABLET | Freq: Four times a day (QID) | ORAL | Status: DC | PRN
  Administered 2016-06-20: 4 mg via ORAL

## 2016-06-18 MED ORDER — BISACODYL 5 MG PO TBEC: 5.0000 mg | DELAYED_RELEASE_TABLET | Freq: Every day | ORAL | Status: DC | PRN

## 2016-06-18 MED ORDER — ONDANSETRON HCL 4 MG/2ML IJ SOLN
4.0000 mg | Freq: Once | INTRAMUSCULAR | Status: AC
  Administered 2016-06-18: 4 mg via INTRAVENOUS
  Filled 2016-06-18: qty 2

## 2016-06-18 MED ORDER — SODIUM CHLORIDE 0.9 % IV SOLN
INTRAVENOUS | Status: DC
  Administered 2016-06-19 – 2016-06-20 (×3): via INTRAVENOUS

## 2016-06-18 MED ORDER — ACETAMINOPHEN 325 MG PO TABS: 650.0000 mg | ORAL_TABLET | Freq: Four times a day (QID) | ORAL | Status: DC | PRN

## 2016-06-18 MED ORDER — VITAMIN D 1000 UNITS PO TABS
1000.0000 [IU] | ORAL_TABLET | Freq: Every day | ORAL | Status: DC
  Administered 2016-06-19 – 2016-06-21 (×3): 1000 [IU] via ORAL
  Filled 2016-06-18 (×6): qty 1

## 2016-06-18 MED ORDER — METRONIDAZOLE IN NACL 5-0.79 MG/ML-% IV SOLN
500.0000 mg | Freq: Three times a day (TID) | INTRAVENOUS | Status: DC
  Administered 2016-06-19 – 2016-06-20 (×5): 500 mg via INTRAVENOUS
  Filled 2016-06-18 (×7): qty 100

## 2016-06-18 MED ORDER — PREDNISOLONE ACETATE 1 % OP SUSP
1.0000 [drp] | Freq: Four times a day (QID) | OPHTHALMIC | Status: DC
  Filled 2016-06-18: qty 1

## 2016-06-18 MED ORDER — PIPERACILLIN-TAZOBACTAM 3.375 G IVPB 30 MIN: 3.3750 g | Freq: Once | INTRAVENOUS | Status: DC

## 2016-06-18 MED ORDER — LISINOPRIL 20 MG PO TABS
20.0000 mg | ORAL_TABLET | Freq: Every day | ORAL | Status: DC
  Administered 2016-06-19 – 2016-06-21 (×3): 20 mg via ORAL
  Filled 2016-06-18 (×3): qty 1

## 2016-06-18 MED ORDER — IPRATROPIUM BROMIDE 0.02 % IN SOLN: 0.5000 mg | Freq: Four times a day (QID) | RESPIRATORY_TRACT | Status: DC | PRN

## 2016-06-18 MED ORDER — PROMETHAZINE HCL 25 MG/ML IJ SOLN
12.5000 mg | Freq: Once | INTRAMUSCULAR | Status: AC
  Administered 2016-06-19: 12.5 mg via INTRAVENOUS
  Filled 2016-06-18: qty 1

## 2016-06-18 MED ORDER — SENNOSIDES-DOCUSATE SODIUM 8.6-50 MG PO TABS: 1.0000 | ORAL_TABLET | Freq: Every evening | ORAL | Status: DC | PRN

## 2016-06-18 MED ORDER — NITROGLYCERIN 0.4 MG SL SUBL: 0.4000 mg | SUBLINGUAL_TABLET | SUBLINGUAL | Status: DC | PRN

## 2016-06-18 MED ORDER — PANTOPRAZOLE SODIUM 40 MG PO TBEC
40.0000 mg | DELAYED_RELEASE_TABLET | Freq: Every day | ORAL | Status: DC
  Administered 2016-06-19 – 2016-06-21 (×3): 40 mg via ORAL
  Filled 2016-06-18 (×3): qty 1

## 2016-06-18 MED ORDER — CARVEDILOL 12.5 MG PO TABS
25.0000 mg | ORAL_TABLET | Freq: Two times a day (BID) | ORAL | Status: DC
  Administered 2016-06-19 – 2016-06-21 (×6): 25 mg via ORAL
  Filled 2016-06-18 (×6): qty 2

## 2016-06-18 MED ORDER — ALBUTEROL SULFATE (2.5 MG/3ML) 0.083% IN NEBU: 2.5000 mg | INHALATION_SOLUTION | Freq: Four times a day (QID) | RESPIRATORY_TRACT | Status: DC | PRN

## 2016-06-18 MED ORDER — ONDANSETRON HCL 4 MG/2ML IJ SOLN
4.0000 mg | Freq: Once | INTRAMUSCULAR | Status: AC
  Administered 2016-06-18: 4 mg via INTRAVENOUS

## 2016-06-18 MED ORDER — MAGNESIUM CITRATE PO SOLN
1.0000 | Freq: Once | ORAL | Status: DC | PRN
  Filled 2016-06-18: qty 296

## 2016-06-18 MED ORDER — ACETAMINOPHEN 650 MG RE SUPP
650.0000 mg | Freq: Four times a day (QID) | RECTAL | Status: DC | PRN
Start: 2016-06-18 — End: 2016-06-21

## 2016-06-18 MED ORDER — FENTANYL CITRATE (PF) 100 MCG/2ML IJ SOLN
100.0000 ug | Freq: Once | INTRAMUSCULAR | Status: AC
Start: 2016-06-18 — End: 2016-06-18
  Administered 2016-06-18: 100 ug via INTRAVENOUS
  Filled 2016-06-18: qty 2

## 2016-06-18 MED ORDER — POTASSIUM CHLORIDE CRYS ER 20 MEQ PO TBCR
20.0000 meq | EXTENDED_RELEASE_TABLET | Freq: Every day | ORAL | Status: DC
  Filled 2016-06-18 (×3): qty 1

## 2016-06-18 MED ORDER — ONDANSETRON HCL 4 MG/2ML IJ SOLN
INTRAMUSCULAR | Status: AC
  Filled 2016-06-18: qty 2

## 2016-06-18 MED ORDER — ONDANSETRON HCL 4 MG/2ML IJ SOLN
4.0000 mg | Freq: Four times a day (QID) | INTRAMUSCULAR | Status: DC | PRN
  Administered 2016-06-19 – 2016-06-20 (×3): 4 mg via INTRAVENOUS
  Filled 2016-06-18 (×3): qty 2

## 2016-06-18 NOTE — ED Notes (Signed)
Pt assisted to bedpan.  ?

## 2016-06-18 NOTE — ED Notes (Signed)
Pt changed and found to be dry.

## 2016-06-18 NOTE — ED Notes (Signed)
Pt rounded on in room and found in bed covered with urine. Pt is alert and oriented x4 and does not want to be moved or cleaned by staff at this time.

## 2016-06-18 NOTE — ED Provider Notes (Addendum)
[facility] Emergency Department Provider Note  Time seen: 5:55 PM  I have reviewed the triage vital signs and the nursing notes.   HISTORY  Chief Complaint Abdominal Pain    HPI Janet Duffy is a 83 y.o. female With a past medical history hypertension, diverticulitis, presents to the emergency department left lower quadrant pain. According to the patientapproximately 4 days ago she developed left lower quadrant abdominal pain which has progressively worsened. States initially was intermittent but now it is constant. Denies vomiting but states she is very nauseated. Denies diarrhea, black or bloody stool. Denies dysuria or hematuria. Patient  describes her pain as moderate to severe currently.  Past Medical History:  Diagnosis Date  . Connective tissue disease ([place])   . DDD (degenerative disc disease), lumbar   . Granuloma annulare   . HTN (hypertension)   . Osteoporosis     There are no active problems to display for this patient.   History reviewed. No pertinent surgical history.  Prior to Admission medications   Medication Sig Start Date End Date Taking? Authorizing Provider  carvedilol (COREG) 25 MG tablet Take 25 mg by mouth 2 (two) times daily. 05/18/16  Yes Historical Provider, MD  hydrALAZINE (APRESOLINE) 50 MG tablet Take 50 mg by mouth 3 (three) times daily. 05/24/16 05/24/17 Yes Historical Provider, MD  lisinopril (PRINIVIL,ZESTRIL) 20 MG tablet Take 20 mg by mouth daily. 04/22/16 04/22/17 Yes Historical Provider, MD  omeprazole (PRILOSEC) 20 MG capsule Take 20 mg by mouth daily. 12/04/15  Yes Historical Provider, MD  prednisoLONE acetate (PRED FORTE) 1 % ophthalmic suspension Place 1 drop into the right eye 4 (four) times daily. 06/10/16 06/22/16 Yes Historical Provider, MD  spironolactone (ALDACTONE) 25 MG tablet Take 25 mg by mouth daily. ' 06/03/16 06/03/17 Yes Historical Provider, MD  Cholecalciferol (VITAMIN D3) 1000 units CAPS Take 1,000 Units by  mouth daily.    Historical Provider, MD    Allergies  Allergen Reactions  . Amlodipine   . Avelox [Moxifloxacin Hcl In Nacl]   . Clindamycin/Lincomycin   . Codeine   . Erythromycin   . Ivp Dye [Iodinated Diagnostic Agents]   . Monosodium Glutamate   . Shellfish Allergy   . Zostavax [Zoster Vaccine Live]     History reviewed. No pertinent family history.  Social History Social History  Substance Use Topics  . Smoking status: Never Smoker  . Smokeless tobacco: Never Used  . Alcohol use No    Review of Systems Constitutional: Negative for fever. Cardiovascular: Negative for chest pain. Respiratory: Negative for shortness of breath. Gastrointestinal: left lower quadrant abdominal pain. Neurological: Negative for headache 10-point ROS otherwise negative.  ____________________________________________   PHYSICAL EXAM:  VITAL SIGNS: ED Triage Vitals  Enc Vitals Group     BP 06/18/16 1732 (!) 204/74     Pulse Rate 06/18/16 1732 78     Resp 06/18/16 1732 (!) 22     Temp 06/18/16 1732 98.1 F (36.7 C)     Temp Source 06/18/16 1732 Oral     SpO2 06/18/16 1732 100 %     Weight 06/18/16 1733 141 lb (64 kg)     Height 06/18/16 1733 5\' 4"  (1.626 m)     Head Circumference --      Peak Flow --      Pain Score 06/18/16 1733 10     Pain Loc --      Pain Edu? --      Excl. in [place]? --  Constitutional: Alert and oriented. mild distress due to pain. Eyes: Normal exam ENT   Head: Normocephalic and atraumatic.   Mouth/Throat: Mucous membranes are moist. Cardiovascular: Normal rate, regular rhythm. No murmurs, rubs, or gallops. Respiratory: Normal respiratory effort without tachypnea nor retractions. Breath sounds are clear  Gastrointestinal: soft, moderate left lower quadrant tenderness palpation, no rebound or guarding. No distention. Musculoskeletal: Nontender with normal range of motion in all extremities.  Neurologic:  Normal speech and language. No gross focal  neurologic deficits  Skin:  Skin is warm, dry and intact.  Psychiatric: Mood and affect are normal.  ____________________________________________     RADIOLOGY  IMPRESSION: 1. There is thickening associated with the mid to distal sigmoid colon which could be due to stenosis secondary to previous chronic episodes of diverticulitis. However, underlying neoplasm is not excluded. This thickening results in fecal loading throughout the colon, most marked in the sigmoid colon just proximal to the region of thickening. Recommend colonoscopy after resolution of symptoms to exclude underlying neoplasm if 1 has not recently been performed. 2. Mild increased attenuation in the fat adjacent to the descending colon and sigmoid colon is likely reactive to the marked fecal loading and relative obstruction caused by the thickening in the mid to distal sigmoid colon. No focal diverticulitis. 3. Atherosclerosis.   EKG reviewed and interpreted by myself shows normal sinus rhythm at 72 bpm, narrow QRS, normal axis, normal intervals, no ST changes. ____________________________________________   INITIAL IMPRESSION / ASSESSMENT AND PLAN / ED COURSE  Pertinent labs & imaging results that were available during my care of the patient were reviewed by me and considered in my medical decision making (see chart for details).  the patient presents to the emergency department with left lower quadrant pain, worse over the past 4 days, now describes the pain as severe, dull aching pain. Positive for nausea but negative for vomiting, diarrhea, black or bloody stool, hematuria or dysuria, negative for fever. Patient does have moderate tenderness palpation on examination. We will obtain labs, CT scan of the abdomen without pelvis given the patient's contrast allergy.  CT scan shows likely diverticulitis cannot rule out neoplasm but there is marked fecal loading proximal to the sigmoid colon. Patient continues to  have significant abdominal pain after multiple rounds of IV pain medication. I discussed the patient with GI medicine who recommends MiraLAX, antibiotics and the patient will need a colonoscopy once this has resolved. Patient states her last colonoscopy was approximately one year ago. This is reassuring however she will still need GI follow-up. We will admit to the hospital. Patient agreeable.  ____________________________________________   FINAL CLINICAL IMPRESSION(S) / ED DIAGNOSES  Left lower quadrant abdominal pain  diverticulitis   Harvest Dark, MD 06/18/16 2011    Harvest Dark, MD 06/18/16 2015

## 2016-06-18 NOTE — Progress Notes (Signed)
Pharmacy Antibiotic Note  Janet Duffy is a 83 y.o. female admitted on 06/18/2016 with Diverticulitis.  Pharmacy has been consulted for Cipro dosing.  Plan: Will initiate ciprofloxacin 400 mg IV q12h and monitor for resolution of signs/symptoms of diverticulitis  QTc WNL  Height: 5\' 4"  (162.6 cm) Weight: 135 lb 1.6 oz (61.3 kg) IBW/kg (Calculated) : 54.7  Temp (24hrs), Avg:98.3 F (36.8 C), Min:98.1 F (36.7 C), Max:98.5 F (36.9 C)   Recent Labs Lab 06/18/16 1737  WBC 5.4  CREATININE 0.95    Estimated Creatinine Clearance: 38.7 mL/min (by C-G formula based on SCr of 0.95 mg/dL).    Allergies  Allergen Reactions  . Amlodipine   . Avelox [Moxifloxacin Hcl In Nacl]   . Clindamycin/Lincomycin   . Codeine   . Erythromycin   . Ivp Dye [Iodinated Diagnostic Agents]   . Monosodium Glutamate   . Penicillins   . Shellfish Allergy   . Zostavax [Zoster Vaccine Live]     Thank you for allowing pharmacy to be a part of this patient's care.  Tobie Lords, PharmD, BCPS Clinical Pharmacist 06/18/2016

## 2016-06-18 NOTE — H&P (Signed)
History and Physical   [facility] - [place] [facility] Admission History and Physical [employer], D.O.    Patient Name: Janet Duffy MR#: [id number] Date of Birth: [birth year] Date of Admission: 06/18/2016  Referring MD/NP/PA: Dr. Kerman Passey Primary Care Physician: Don Broach, MD Patient coming from: Home Outpatient Specialists: Cardiology, Duaine Dredge, Ortho  Chief Complaint:  Chief Complaint  Patient presents with  . Abdominal Pain    HPI: Janet Duffy is a 83 y.o. female with a known history of DDD, granuloma annulare, HTN, osteoporosis, chronic ischemic colitis, iron deficiency anemia, atrial fibrillation, undifferetniated connective tissue disease presents to the emergency department for evaluation of abdominal pain.  Patient was in a usual state of health until 4 days ago when she reports the onset of left lower quadrant pain which is become progressively worse and associated with nausea. Patient states that she has had diverticulitis in the past symptoms are similar.   Of note patient was found to have a syncopal episode while in triage.  Otherwise there has been no change in status. Patient has been taking medication as prescribed and there has been no recent change in medication or diet.  No recent antibiotics.  There has been no recent illness, hospitalizations, travel or sick contacts.    Patient denies fevers/chills, weakness, dizziness, chest pain, shortness of breath, dysuria/frequency, changes in mental status.   ED Course: Patient received fentanyl, zofran, NS.  Review of Systems:  CONSTITUTIONAL: No fever/chills, fatigue, weakness, weight gain/loss, headache. EYES: No blurry or double vision. ENT: No tinnitus, postnasal drip, redness or soreness of the oropharynx. RESPIRATORY: No cough, dyspnea, wheeze.  No hemoptysis.  CARDIOVASCULAR: No chest pain, palpitations, syncope, orthopnea. No lower extremity edema.  GASTROINTESTINAL: Positive  nausea, vomiting, abdominal pain, Negative diarrhea, constipation.  No hematemesis, melena or hematochezia. GENITOURINARY: No dysuria, frequency, hematuria. ENDOCRINE: No polyuria or nocturia. No heat or cold intolerance. HEMATOLOGY: No anemia, bruising, bleeding. INTEGUMENTARY: No rashes, ulcers, lesions. MUSCULOSKELETAL: No arthritis, gout, dyspnea. NEUROLOGIC: No numbness, tingling, ataxia, seizure-type activity, weakness. Positive loss of consciousness 1 PSYCHIATRIC: No anxiety, depression, insomnia.   Past Medical History:  Diagnosis Date  . Connective tissue disease ([place])   . DDD (degenerative disc disease), lumbar   . Granuloma annulare   . HTN (hypertension)   . Osteoporosis     Surgical history: Back surgery in 2010, hysterectomy in 2002, cardiac, skin biopsy, cataract extractions   reports that she has never smoked. She has never used smokeless tobacco. She reports that she does not drink alcohol or use drugs.  Allergies  Allergen Reactions  . Amlodipine   . Avelox [Moxifloxacin Hcl In Nacl]   . Clindamycin/Lincomycin   . Codeine   . Erythromycin   . Ivp Dye [Iodinated Diagnostic Agents]   . Monosodium Glutamate   . Penicillins   . Shellfish Allergy   . Zostavax [Zoster Vaccine Live]   Family History   Medical History Relation Name Comments  Cancer Brother    Colon cancer Brother    Parkinsonism Brother    Cancer Brother    Cancer Brother  colon  Diabetes Father    High blood pressure (Hypertension) Father    Stroke Father    Cancer Mother  ovarian  Ovarian cancer Mother    Cancer Other family   Diabetes Other family   Heart disease Other family   Breast cancer Sister    Cancer Sister  breast  Coronary Artery Disease (Blocked arteries around heart) Sister  Myocardial Infarction (Heart attack) Sister    Osteoarthritis Sister       Prior to Admission medications   Medication Sig Start Date End Date Taking? Authorizing  Provider  carvedilol (COREG) 25 MG tablet Take 25 mg by mouth 2 (two) times daily. 05/18/16  Yes Historical Provider, MD  Cholecalciferol (VITAMIN D3) 1000 units CAPS Take 1,000 Units by mouth daily.   Yes Historical Provider, MD  hydrALAZINE (APRESOLINE) 50 MG tablet Take 50 mg by mouth 3 (three) times daily. 05/24/16 05/24/17 Yes Historical Provider, MD  lisinopril (PRINIVIL,ZESTRIL) 20 MG tablet Take 20 mg by mouth daily. 04/22/16 04/22/17 Yes Historical Provider, MD  nitroGLYCERIN (NITROSTAT) 0.4 MG SL tablet Place 0.4 mg under the tongue every 5 (five) minutes as needed for chest pain.   Yes Historical Provider, MD  omeprazole (PRILOSEC) 20 MG capsule Take 20 mg by mouth daily. 12/04/15  Yes Historical Provider, MD  potassium chloride SA (K-DUR,KLOR-CON) 20 MEQ tablet Take 20 mEq by mouth daily.   Yes Historical Provider, MD  psyllium (REGULOID) 0.52 g capsule Take 0.52 g by mouth 2 (two) times daily.   Yes Historical Provider, MD  spironolactone (ALDACTONE) 25 MG tablet Take 25 mg by mouth daily. ' 06/03/16 06/03/17 Yes Historical Provider, MD  prednisoLONE acetate (PRED FORTE) 1 % ophthalmic suspension Place 1 drop into the right eye 4 (four) times daily. 06/10/16 06/22/16  Historical Provider, MD    Physical Exam: Vitals:   06/18/16 1848 06/18/16 1900 06/18/16 1930 06/18/16 2000  BP: (!) 178/83 (!) 163/83 (!) 168/86 (!) 165/83  Pulse: 69 70 71 78  Resp: 16 19 19 14   Temp:      TempSrc:      SpO2: 96% 99% 97% 97%  Weight:      Height:        GENERAL: 83 y.o.-year-old female patient, well-developed, well-nourished lying in the bed in no acute distress.  Pleasant and cooperative.   HEENT: Head atraumatic, normocephalic. Pupils equal, round, reactive to light and accommodation. No scleral icterus. Extraocular muscles intact. Nares are patent. Oropharynx is clear. Mucus membranes moist. NECK: Supple, full range of motion. No JVD, no bruit heard.  CHEST: Normal breath sounds bilaterally. No  wheezing, rales, rhonchi or crackles. No use of accessory muscles of respiration.  No reproducible chest wall tenderness.  CARDIOVASCULAR: S1, S2 normal. No murmurs, rubs, or gallops. Cap refill <2 seconds. Pulses intact distally.  ABDOMEN: Soft, nondistended, Mild diffuse tenderness worse at left lower quadrant No rebound, guarding, rigidity. Normoactive bowel sounds present in all four quadrants. No organomegaly or mass. EXTREMITIES: No pedal edema, cyanosis, or clubbing. No calf tenderness or Homan's sign.  NEUROLOGIC: The patient is alert and oriented x 3. Cranial nerves II through XII are grossly intact with no focal sensorimotor deficit. Muscle strength 5/5 in all extremities. Sensation intact. Gait not checked. PSYCHIATRIC:  Normal affect, mood, thought content. SKIN: Warm, dry, and intact without obvious rash, lesion, or ulcer.    Labs on Admission:  CBC:  Recent Labs Lab 06/18/16 1737  WBC 5.4  HGB 11.0*  HCT 32.2*  MCV 76.2*  PLT 960   Basic Metabolic Panel:  Recent Labs Lab 06/18/16 1737  NA 135  K 3.9  CL 103  CO2 25  GLUCOSE 117*  BUN 10  CREATININE 0.95  CALCIUM 9.7   GFR: Estimated Creatinine Clearance: 38.7 mL/min (by C-G formula based on SCr of 0.95 mg/dL). Liver Function Tests:  Recent Labs Lab 06/18/16 1737  AST 21  ALT 11*  ALKPHOS 52  BILITOT 0.6  PROT 8.9*  ALBUMIN 3.3*    Recent Labs Lab 06/18/16 1737  LIPASE 29   No results for input(s): AMMONIA in the last 168 hours. Coagulation Profile: No results for input(s): INR, PROTIME in the last 168 hours. Cardiac Enzymes: No results for input(s): CKTOTAL, CKMB, CKMBINDEX, TROPONINI in the last 168 hours. BNP (last 3 results) No results for input(s): PROBNP in the last 8760 hours. HbA1C: No results for input(s): HGBA1C in the last 72 hours. CBG: No results for input(s): GLUCAP in the last 168 hours. Lipid Profile: No results for input(s): CHOL, HDL, LDLCALC, TRIG, CHOLHDL, LDLDIRECT  in the last 72 hours. Thyroid Function Tests: No results for input(s): TSH, T4TOTAL, FREET4, T3FREE, THYROIDAB in the last 72 hours. Anemia Panel: No results for input(s): VITAMINB12, FOLATE, FERRITIN, TIBC, IRON, RETICCTPCT in the last 72 hours. Urine analysis:    Component Value Date/Time   COLORURINE STRAW (A) 06/18/2016 1843   APPEARANCEUR CLEAR (A) 06/18/2016 1843   LABSPEC 1.006 06/18/2016 1843   PHURINE 9.0 (H) 06/18/2016 1843   GLUCOSEU NEGATIVE 06/18/2016 1843   HGBUR NEGATIVE 06/18/2016 1843   BILIRUBINUR NEGATIVE 06/18/2016 1843   KETONESUR 5 (A) 06/18/2016 1843   PROTEINUR NEGATIVE 06/18/2016 1843   NITRITE NEGATIVE 06/18/2016 1843   LEUKOCYTESUR NEGATIVE 06/18/2016 1843   Sepsis Labs: @LABRCNTIP (procalcitonin:4,lacticidven:4) )No results found for this or any previous visit (from the past 240 hour(s)).   Radiological Exams on Admission: Ct Abdomen Pelvis Wo Contrast  Result Date: 06/18/2016 CLINICAL DATA:  Abdominal pain.  History of diverticulitis. EXAM: CT ABDOMEN AND PELVIS WITHOUT CONTRAST TECHNIQUE: Multidetector CT imaging of the abdomen and pelvis was performed following the standard protocol without IV contrast. COMPARISON:  June 26, 2014 FINDINGS: Lower chest: Mild reticular changes in the lung bases are similar, likely fibrosis, unchanged in the interval. No infiltrate. No suspicious nodules or masses. There is a small hiatal hernia. The visualized heart is unchanged. Hepatobiliary: No focal liver abnormality is seen. No gallstones, gallbladder wall thickening, or biliary dilatation. Pancreas: Unremarkable. No pancreatic ductal dilatation or surrounding inflammatory changes. Spleen: Normal in size without focal abnormality. Adrenals/Urinary Tract: Adrenal glands are unremarkable. Kidneys are normal, without renal calculi, focal lesion, or hydronephrosis. Bladder is unremarkable. Stomach/Bowel: There is a posterior gastric diverticulum without associated inflammation  seen on image 20 of series 2. The stomach and small bowel are otherwise normal. The rectum and distal sigmoid colon are normal. There is thickening associated with the mid to distal sigmoid colon as seen on series 2, image 65 and coronal images 39 through 47. Proximal to this region of thickening, there is marked fecal loading in the more proximal sigmoid colon. More proximally, fecal loading is seen from the cecum into the distal descending colon other than a small segment of decompressed ascending colon. The appendix is not seen but there is no secondary evidence of appendicitis. There is slight increased attenuation adjacent to the descending colon and sigmoid colon with no focal inflammation identified. No pneumatosis. Vascular/Lymphatic: Atherosclerosis is seen in the abdominal aorta which is non aneurysmal, extending into the iliac vessels. No adenopathy is identified. Reproductive: The patient is status post hysterectomy. Other: No abdominal wall hernia or abnormality. No abdominopelvic ascites. Musculoskeletal: Vertebroplasty at L2. No other acute bony abnormalities. IMPRESSION: 1. There is thickening associated with the mid to distal sigmoid colon which could be due to stenosis secondary to previous chronic episodes of diverticulitis. However, underlying  neoplasm is not excluded. This thickening results in fecal loading throughout the colon, most marked in the sigmoid colon just proximal to the region of thickening. Recommend colonoscopy after resolution of symptoms to exclude underlying neoplasm if 1 has not recently been performed. 2. Mild increased attenuation in the fat adjacent to the descending colon and sigmoid colon is likely reactive to the marked fecal loading and relative obstruction caused by the thickening in the mid to distal sigmoid colon. No focal diverticulitis. 3. Atherosclerosis. Electronically Signed   By: Dorise Bullion III M.D   On: 06/18/2016 19:00    EKG: Normal sinus rhythm at  72 bpm with normal axis and nonspecific ST-T wave changes.   Assessment/Plan  This is a 83 y.o. female with a history of DDD, granuloma annulare, HTN, osteoporosis, chronic ischemic colitis, iron deficiency anemia, atrial fibrillation, undifferentiated connective tissue disease,   now being admitted with:  #. Acute diverticulitis - Admit inpatient - IV Cipro and Flagyl, Bacid - Stool studies - Clear liquid diet -GI consultation has been requested  #. Constipation - Bowel regimen  #. Anemia, chronic, stable - Monitor CBC  #. History of hypertension - Continue hydralazine, lisinopril, Coreg, spironolactone  #. History of GERD - Continue Prilosec  #. History of atrial fibrillation - Continue Coreg  Admission status: Inpatient IV Fluids: NS Diet/Nutrition: Clears Consults called: GI  DVT Px: Lovenox, SCDs and early ambulation. Code Status: Full Code  Disposition Plan: To home in 1-2 days  All the records are reviewed and case discussed with ED provider. Management plans discussed with the patient and/or family who express understanding and agree with plan of care.  Machel Violante D.O. on 06/18/2016 at 9:04 PM Between 7am to 6pm - Pager - [phone] After 6pm go to [email or web address] - [employer] [facility] [place] [facility] Office [phone] CC: Primary care physician; Don Broach, MD   06/18/2016, 9:04 PM

## 2016-06-18 NOTE — ED Triage Notes (Addendum)
Pt to ed with c/o abd pain, hx of diverticulitis.  Pt had syncopal episode at triage.

## 2016-06-19 DIAGNOSIS — R1032 Left lower quadrant pain: Secondary | ICD-10-CM | POA: Diagnosis not present

## 2016-06-19 DIAGNOSIS — K5732 Diverticulitis of large intestine without perforation or abscess without bleeding: Secondary | ICD-10-CM | POA: Diagnosis not present

## 2016-06-19 LAB — COMPREHENSIVE METABOLIC PANEL
ALBUMIN: 2.8 g/dL — AB (ref 3.5–5.0)
ALT: 11 U/L — AB (ref 14–54)
AST: 17 U/L (ref 15–41)
Alkaline Phosphatase: 47 U/L (ref 38–126)
Anion gap: 5 (ref 5–15)
BUN: 10 mg/dL (ref 6–20)
CHLORIDE: 107 mmol/L (ref 101–111)
CO2: 27 mmol/L (ref 22–32)
CREATININE: 0.92 mg/dL (ref 0.44–1.00)
Calcium: 8.5 mg/dL — ABNORMAL LOW (ref 8.9–10.3)
GFR calc Af Amer: >60 mL/min (ref >60)
GFR calc non Af Amer: 56 mL/min — ABNORMAL LOW (ref >60)
GLUCOSE: 108 mg/dL — AB (ref 65–99)
POTASSIUM: 3.5 mmol/L (ref 3.5–5.1)
Sodium: 139 mmol/L (ref 135–145)
Total Bilirubin: 0.4 mg/dL (ref 0.3–1.2)
Total Protein: 7.8 g/dL (ref 6.5–8.1)

## 2016-06-19 LAB — CBC
HCT: 27.9 % — ABNORMAL LOW (ref 35.0–47.0)
Hemoglobin: 9.2 g/dL — ABNORMAL LOW (ref 12.0–16.0)
MCH: 25.4 pg — AB (ref 26.0–34.0)
MCHC: 33.2 g/dL (ref 32.0–36.0)
MCV: 76.5 fL — AB (ref 80.0–100.0)
PLATELETS: 182 10*3/uL (ref 150–440)
RBC: 3.64 MIL/uL — AB (ref 3.80–5.20)
RDW: 19.8 % — ABNORMAL HIGH (ref 11.5–14.5)
WBC: 5.3 10*3/uL (ref 3.6–11.0)

## 2016-06-19 LAB — GLUCOSE, CAPILLARY: GLUCOSE-CAPILLARY: 97 mg/dL (ref 65–99)

## 2016-06-19 MED ORDER — IBUPROFEN 400 MG PO TABS
800.0000 mg | ORAL_TABLET | Freq: Four times a day (QID) | ORAL | Status: DC | PRN
  Administered 2016-06-19 (×2): 800 mg via ORAL
  Filled 2016-06-19 (×2): qty 2

## 2016-06-19 MED ORDER — RISAQUAD PO CAPS
2.0000 | ORAL_CAPSULE | Freq: Three times a day (TID) | ORAL | Status: DC
  Administered 2016-06-19 – 2016-06-21 (×5): 2 via ORAL
  Filled 2016-06-19 (×6): qty 2

## 2016-06-19 MED ORDER — POLYETHYLENE GLYCOL 3350 17 G PO PACK
17.0000 g | PACK | Freq: Two times a day (BID) | ORAL | Status: DC
  Administered 2016-06-19 – 2016-06-20 (×2): 17 g via ORAL
  Filled 2016-06-19 (×3): qty 1

## 2016-06-19 MED ORDER — SODIUM CHLORIDE 0.9 % IV BOLUS (SEPSIS)
500.0000 mL | Freq: Once | INTRAVENOUS | Status: AC
Start: 2016-06-19 — End: 2016-06-19
  Administered 2016-06-19: 500 mL via INTRAVENOUS

## 2016-06-19 NOTE — Progress Notes (Signed)
PT Cancellation Note  Patient Details Name: Janet Duffy MRN: [id number] DOB: 1931/06/06   Cancelled Treatment:    Reason Eval/Treat Not Completed: Other (comment) (Pt in pain and just admitted from ED, wants to wait) until tomorrow.  Will see in AM to attempt mobility then.   Ramond Dial 06/19/2016, 2:39 PM   Mee Hives, PT MS Acute Rehab Dept. Number: [place] [facility] [place]

## 2016-06-19 NOTE — Progress Notes (Signed)
[facility] [place] NAME: Janet Duffy    MR#:  [id number]  DATE OF BIRTH:  05-04-1931  SUBJECTIVE:  CHIEF COMPLAINT:   Chief Complaint  Patient presents with  . Abdominal Pain   Still has lower abdominal pain. Had vasovagal syncope overnight on the commode in the hospital.  REVIEW OF SYSTEMS:    Review of Systems  Constitutional: Positive for malaise/fatigue. Negative for chills and fever.  HENT: Negative for sore throat.   Eyes: Negative for blurred vision, double vision and pain.  Respiratory: Negative for cough, hemoptysis, shortness of breath and wheezing.   Cardiovascular: Negative for chest pain, palpitations, orthopnea and leg swelling.  Gastrointestinal: Positive for abdominal pain. Negative for constipation, diarrhea, heartburn, nausea and vomiting.  Genitourinary: Negative for dysuria and hematuria.  Musculoskeletal: Negative for back pain and joint pain.  Skin: Negative for rash.  Neurological: Negative for sensory change, speech change, focal weakness and headaches.  Endo/Heme/Allergies: Does not bruise/bleed easily.  Psychiatric/Behavioral: Negative for depression. The patient is not nervous/anxious.     DRUG ALLERGIES:   Allergies  Allergen Reactions  . Amlodipine   . Avelox [Moxifloxacin Hcl In Nacl]   . Clindamycin/Lincomycin   . Codeine   . Erythromycin   . Ivp Dye [Iodinated Diagnostic Agents]   . Monosodium Glutamate   . Penicillins   . Shellfish Allergy   . Zostavax [Zoster Vaccine Live]     VITALS:  Blood pressure (!) 160/75, pulse 66, temperature 97.6 F (36.4 C), temperature source Oral, resp. rate 18, height 5\' 4"  (1.626 m), weight 61.3 kg (135 lb 1.6 oz), SpO2 100 %.  PHYSICAL EXAMINATION:   Physical Exam  GENERAL:  83 y.o.-year-old patient lying in the bed with no acute distress.  EYES: Pupils equal, round, reactive to light and accommodation. No scleral icterus. Extraocular muscles intact.   HEENT: Head atraumatic, normocephalic. Oropharynx and nasopharynx clear.  NECK:  Supple, no jugular venous distention. No thyroid enlargement, no tenderness.  LUNGS: Normal breath sounds bilaterally, no wheezing, rales, rhonchi. No use of accessory muscles of respiration.  CARDIOVASCULAR: S1, S2 normal. No murmurs, rubs, or gallops.  ABDOMEN: Soft, nondistended. Bowel sounds present. No organomegaly or mass. Lower abdominal tenderness EXTREMITIES: No cyanosis, clubbing or edema b/l.    NEUROLOGIC: Cranial nerves II through XII are intact. No focal Motor or sensory deficits b/l.   PSYCHIATRIC: The patient is alert and oriented x 3.  SKIN: No obvious rash, lesion, or ulcer.   LABORATORY PANEL:   CBC  Recent Labs Lab 06/19/16 0403  WBC 5.3  HGB 9.2*  HCT 27.9*  PLT 182   ------------------------------------------------------------------------------------------------------------------ Chemistries   Recent Labs Lab 06/19/16 0403  NA 139  K 3.5  CL 107  CO2 27  GLUCOSE 108*  BUN 10  CREATININE 0.92  CALCIUM 8.5*  AST 17  ALT 11*  ALKPHOS 47  BILITOT 0.4   ------------------------------------------------------------------------------------------------------------------  Cardiac Enzymes No results for input(s): TROPONINI in the last 168 hours. ------------------------------------------------------------------------------------------------------------------  RADIOLOGY:  Ct Abdomen Pelvis Wo Contrast  Result Date: 06/18/2016 CLINICAL DATA:  Abdominal pain.  History of diverticulitis. EXAM: CT ABDOMEN AND PELVIS WITHOUT CONTRAST TECHNIQUE: Multidetector CT imaging of the abdomen and pelvis was performed following the standard protocol without IV contrast. COMPARISON:  June 26, 2014 FINDINGS: Lower chest: Mild reticular changes in the lung bases are similar, likely fibrosis, unchanged in the interval. No infiltrate. No suspicious nodules or masses. There is a small hiatal  hernia. The visualized heart is unchanged. Hepatobiliary: No focal liver abnormality is seen. No gallstones, gallbladder wall thickening, or biliary dilatation. Pancreas: Unremarkable. No pancreatic ductal dilatation or surrounding inflammatory changes. Spleen: Normal in size without focal abnormality. Adrenals/Urinary Tract: Adrenal glands are unremarkable. Kidneys are normal, without renal calculi, focal lesion, or hydronephrosis. Bladder is unremarkable. Stomach/Bowel: There is a posterior gastric diverticulum without associated inflammation seen on image 20 of series 2. The stomach and small bowel are otherwise normal. The rectum and distal sigmoid colon are normal. There is thickening associated with the mid to distal sigmoid colon as seen on series 2, image 65 and coronal images 39 through 47. Proximal to this region of thickening, there is marked fecal loading in the more proximal sigmoid colon. More proximally, fecal loading is seen from the cecum into the distal descending colon other than a small segment of decompressed ascending colon. The appendix is not seen but there is no secondary evidence of appendicitis. There is slight increased attenuation adjacent to the descending colon and sigmoid colon with no focal inflammation identified. No pneumatosis. Vascular/Lymphatic: Atherosclerosis is seen in the abdominal aorta which is non aneurysmal, extending into the iliac vessels. No adenopathy is identified. Reproductive: The patient is status post hysterectomy. Other: No abdominal wall hernia or abnormality. No abdominopelvic ascites. Musculoskeletal: Vertebroplasty at L2. No other acute bony abnormalities. IMPRESSION: 1. There is thickening associated with the mid to distal sigmoid colon which could be due to stenosis secondary to previous chronic episodes of diverticulitis. However, underlying neoplasm is not excluded. This thickening results in fecal loading throughout the colon, most marked in the  sigmoid colon just proximal to the region of thickening. Recommend colonoscopy after resolution of symptoms to exclude underlying neoplasm if 1 has not recently been performed. 2. Mild increased attenuation in the fat adjacent to the descending colon and sigmoid colon is likely reactive to the marked fecal loading and relative obstruction caused by the thickening in the mid to distal sigmoid colon. No focal diverticulitis. 3. Atherosclerosis. Electronically Signed   By: Dorise Bullion III M.D   On: 06/18/2016 19:00     ASSESSMENT AND PLAN:   This is a 83 y.o. female with a history of DDD, granuloma annulare, HTN, osteoporosis, chronic ischemic colitis, iron deficiency anemia, atrial fibrillation, undifferentiated connective tissue disease,   now being admitted with:  #. Acute diverticulitis - Admit inpatient - IV Cipro and Flagyl, Bacid - Clear liquid diet -GI consultation has been requested for colonoscopy Due to concern regarding stricture or neoplasm on CT scan. Discussed with Dr. Kennedy Bucker called GI. Advised conservative management. Patient had colonoscopy last year which showed some ischemic changes and polyps. No need for repeat colonoscopy. If no improvement needed to repeat the CT scan of the abdomen.  #. Constipation - reolsved - Bowel regimen  #. Anemia, chronic, stable - Monitor CBC  #. History of hypertension - Continue hydralazine, lisinopril, Coreg, spironolactone  #. History of GERD - Continue Prilosec  #. History of atrial fibrillation - Continue Coreg  # Vasovagal syncope on the commode in the hospital. Orthostats negative. Resolved   All the records are reviewed and case discussed with Care Management/Social Workerr. Management plans discussed with the patient, family and they are in agreement.  CODE STATUS: FULL CODE  DVT Prophylaxis: SCDs  TOTAL TIME TAKING CARE OF THIS PATIENT: 40 minutes.   POSSIBLE D/C IN 1-2 DAYS, DEPENDING ON CLINICAL  CONDITION.  Hillary Bow R M.D on 06/19/2016 at 10:47  AM  Between 7am to 6pm - Pager - [phone]  After 6pm go to [email or web address] - password [email or web address] [facility]  Office  [phone]  CC: Primary care physician; Don Broach, MD  Note: This dictation was prepared with Dragon dictation along with smaller phrase technology. Any transcriptional errors that result from this process are unintentional.

## 2016-06-19 NOTE — ED Provider Notes (Signed)
[facility] Department of Emergency Medicine   Code Blue CONSULT NOTE  Chief Complaint: Cardiac arrest/unresponsive   Level V Caveat: Unresponsive  History of present illness: I was contacted by the hospital for a CODE BLUE cardiac arrest upstairs and presented to the patient's bedside.   Patient found lying supine and bathroom after which appears to be a syncopal event. Patient had just received recent dose of hydralazine and became hypotensive resulting in a syncopal event. Blood sugar checked and is normal. Pulses palpated and had good perfusion at this time. Patient does appear weak and frail but no indication for further interventions at this time.     Scheduled Meds: . carvedilol  25 mg Oral BID  . cholecalciferol  1,000 Units Oral Daily  . ciprofloxacin  400 mg Intravenous Q12H  . hydrALAZINE  50 mg Oral TID  . lactobacillus acidophilus  2 tablet Oral TID  . lisinopril  20 mg Oral Daily  . metronidazole  500 mg Intravenous Q8H  . pantoprazole  40 mg Oral Daily  . potassium chloride SA  20 mEq Oral Daily  . prednisoLONE acetate  1 drop Right Eye QID  . spironolactone  25 mg Oral Daily   Continuous Infusions: . sodium chloride 75 mL/hr at 06/19/16 0015   PRN Meds:.acetaminophen **OR** acetaminophen, albuterol, bisacodyl, ipratropium, magnesium citrate, nitroGLYCERIN, ondansetron **OR** ondansetron (ZOFRAN) IV, senna-docusate Past Medical History:  Diagnosis Date  . Connective tissue disease ([place])   . DDD (degenerative disc disease), lumbar   . Granuloma annulare   . HTN (hypertension)   . Osteoporosis    History reviewed. No pertinent surgical history. Social History   Social History  . Marital status: Married    Spouse name: N/A  . Number of children: N/A  . Years of education: N/A   Occupational History  . Not on file.   Social History Main Topics  . Smoking status: Never Smoker  . Smokeless tobacco: Never Used  . Alcohol use No  .  Drug use: No  . Sexual activity: Not on file   Other Topics Concern  . Not on file   Social History Narrative  . No narrative on file   Allergies  Allergen Reactions  . Amlodipine   . Avelox [Moxifloxacin Hcl In Nacl]   . Clindamycin/Lincomycin   . Codeine   . Erythromycin   . Ivp Dye [Iodinated Diagnostic Agents]   . Monosodium Glutamate   . Penicillins   . Shellfish Allergy   . Zostavax [Zoster Vaccine Live]     Last set of Vital Signs (not current) Vitals:   06/19/16 0316 06/19/16 0503  BP: 135/79 (!) 148/76  Pulse: 66 66  Resp: 18 18  Temp: 97.9 F (36.6 C) 97.6 F (36.4 C)      Physical Exam Gen: laying on bathroom floor, frail appearing but in NAD Cardiovascular: 2+ pulses throughout Resp:  No evidence of respiratory distressAbd: nondistended  Neuro: GCS15.  MAE spontaneously Musculoskeletal: No deformity  Skin: warm    Medical Decision making  Responded to bedside for CODE BLUE. Patient after what appears to be syncopal event likely secondary to hypotension and it was medication induced. Glucose check. Patient otherwise improving and hemodynamic stable at this time. Patient is care signed out to the hospitalist.    Merlyn Lot, MD 06/19/16 [phone]

## 2016-06-19 NOTE — Progress Notes (Signed)
Patient passed out while defecating in the rest room. She was held by the aide and lowered to the floor. No head injury. Blood pressure was border line. Bolus 500 ml normal saline and telemetry monitoring. Syncope secondary to vaso vagal etiology Blood sugar was 97. Patient awake and responds to all commands. Completely oriented.

## 2016-06-19 NOTE — Consult Note (Signed)
Consultation  Referring Provider:      Primary Care Physician:  Don Broach, MD Primary Gastroenterologist:    San Jetty MD     Reason for Consultation:     Left sided abdominal pain.      Impression / Plan:   Left sided abdominal pain: CT shows sigmoid thickening in area of sigmoid diverticulosis without diverticulitis or ischemic changes. Agree with IV Cipro and Flagyl and follow clinically. Colonoscopy done 09-2015 at [facility] found incidental polyps which were removed, sigmoid diverticulosis and descending ischemic changes. No need for luminal inspection at this time. Advance diet to full liquid and keep stool soft and regular.          HPI:   Janet Duffy is a 83 y.o. female reports the onset of left lower quadrant pain which is become progressively worse and associated with nausea. Patient states that she has had diverticulitis in the past symptoms are similar. Ct scan results yesterday showed there is thickening associated with the mid to distal sigmoid colon which could be due to stenosis secondary to previous chronic episodes of diverticulitis. However, underlying neoplasm is not excluded. This thickening results in fecal loading throughout the colon, most marked in the sigmoid colon just proximal to the region of thickening.  Colonoscopy at [facility] 09-2015 showed, colon polyps, sigmoid diverticulosis and descending colon colitis. Biopsies favored ischemic colitis.   WBC 5.4. Started on IV Cipro and Flagyl.   Past Medical History:  Diagnosis Date  . Connective tissue disease ([place])   . DDD (degenerative disc disease), lumbar   . Granuloma annulare   . HTN (hypertension)   . Osteoporosis     History reviewed. No pertinent surgical history.  History reviewed. No pertinent family history.    Social History  Substance Use Topics  . Smoking status: Never Smoker  . Smokeless tobacco: Never Used  . Alcohol use No    Prior to Admission medications   Medication Sig Start  Date End Date Taking? Authorizing Provider  carvedilol (COREG) 25 MG tablet Take 25 mg by mouth 2 (two) times daily. 05/18/16  Yes Historical Provider, MD  Cholecalciferol (VITAMIN D3) 1000 units CAPS Take 1,000 Units by mouth daily.   Yes Historical Provider, MD  hydrALAZINE (APRESOLINE) 50 MG tablet Take 50 mg by mouth 3 (three) times daily. 05/24/16 05/24/17 Yes Historical Provider, MD  lisinopril (PRINIVIL,ZESTRIL) 20 MG tablet Take 20 mg by mouth daily. 04/22/16 04/22/17 Yes Historical Provider, MD  nitroGLYCERIN (NITROSTAT) 0.4 MG SL tablet Place 0.4 mg under the tongue every 5 (five) minutes as needed for chest pain.   Yes Historical Provider, MD  omeprazole (PRILOSEC) 20 MG capsule Take 20 mg by mouth daily. 12/04/15  Yes Historical Provider, MD  potassium chloride SA (K-DUR,KLOR-CON) 20 MEQ tablet Take 20 mEq by mouth daily.   Yes Historical Provider, MD  psyllium (REGULOID) 0.52 g capsule Take 0.52 g by mouth 2 (two) times daily.   Yes Historical Provider, MD  spironolactone (ALDACTONE) 25 MG tablet Take 25 mg by mouth daily. ' 06/03/16 06/03/17 Yes Historical Provider, MD  prednisoLONE acetate (PRED FORTE) 1 % ophthalmic suspension Place 1 drop into the right eye 4 (four) times daily. 06/10/16 06/22/16  Historical Provider, MD    Current Facility-Administered Medications  Medication Dose Route Frequency Provider Last Rate Last Dose  . 0.9 %  sodium chloride infusion   Intravenous Continuous Alexis Hugelmeyer, DO 75 mL/hr at 06/19/16 0733    . acetaminophen (TYLENOL) tablet 650 mg  650 mg Oral Q6H PRN Alexis Hugelmeyer, DO       Or  . acetaminophen (TYLENOL) suppository 650 mg  650 mg Rectal Q6H PRN Alexis Hugelmeyer, DO      . albuterol (PROVENTIL) (2.5 MG/3ML) 0.083% nebulizer solution 2.5 mg  2.5 mg Nebulization Q6H PRN Alexis Hugelmeyer, DO      . bisacodyl (DULCOLAX) EC tablet 5 mg  5 mg Oral Daily PRN Alexis Hugelmeyer, DO      . carvedilol (COREG) tablet 25 mg  25 mg Oral BID Alexis  Hugelmeyer, DO   25 mg at 06/19/16 1020  . cholecalciferol (VITAMIN D) tablet 1,000 Units  1,000 Units Oral Daily Alexis Hugelmeyer, DO   1,000 Units at 06/19/16 1020  . ciprofloxacin (CIPRO) IVPB 400 mg  400 mg Intravenous Q12H Srikar Sudini, MD   400 mg at 06/19/16 0015  . hydrALAZINE (APRESOLINE) tablet 50 mg  50 mg Oral TID Alexis Hugelmeyer, DO   50 mg at 06/19/16 1020  . ipratropium (ATROVENT) nebulizer solution 0.5 mg  0.5 mg Nebulization Q6H PRN Alexis Hugelmeyer, DO      . lactobacillus acidophilus (BACID) tablet 2 tablet  2 tablet Oral TID Alexis Hugelmeyer, DO   2 tablet at 06/19/16 1021  . lisinopril (PRINIVIL,ZESTRIL) tablet 20 mg  20 mg Oral Daily Alexis Hugelmeyer, DO   20 mg at 06/19/16 1020  . magnesium citrate solution 1 Bottle  1 Bottle Oral Once PRN Alexis Hugelmeyer, DO      . metroNIDAZOLE (FLAGYL) IVPB 500 mg  500 mg Intravenous Q8H Alexis Hugelmeyer, DO   500 mg at 06/19/16 0733  . nitroGLYCERIN (NITROSTAT) SL tablet 0.4 mg  0.4 mg Sublingual Q5 min PRN Alexis Hugelmeyer, DO      . ondansetron (ZOFRAN) tablet 4 mg  4 mg Oral Q6H PRN Alexis Hugelmeyer, DO       Or  . ondansetron (ZOFRAN) injection 4 mg  4 mg Intravenous Q6H PRN Alexis Hugelmeyer, DO   4 mg at 06/19/16 0319  . pantoprazole (PROTONIX) EC tablet 40 mg  40 mg Oral Daily Alexis Hugelmeyer, DO   40 mg at 06/19/16 1020  . polyethylene glycol (MIRALAX / GLYCOLAX) packet 17 g  17 g Oral BID Srikar Sudini, MD      . potassium chloride SA (K-DUR,KLOR-CON) CR tablet 20 mEq  20 mEq Oral Daily Alexis Hugelmeyer, DO      . prednisoLONE acetate (PRED FORTE) 1 % ophthalmic suspension 1 drop  1 drop Right Eye QID Alexis Hugelmeyer, DO      . senna-docusate (Senokot-S) tablet 1 tablet  1 tablet Oral QHS PRN Alexis Hugelmeyer, DO      . spironolactone (ALDACTONE) tablet 25 mg  25 mg Oral Daily Alexis Hugelmeyer, DO   25 mg at 06/19/16 1020    Allergies as of 06/18/2016 - Review Complete 06/18/2016  Allergen Reaction Noted   . Amlodipine  06/18/2016  . Avelox [moxifloxacin hcl in nacl]  06/18/2016  . Clindamycin/lincomycin  06/18/2016  . Codeine  06/18/2016  . Erythromycin  06/18/2016  . Ivp dye [iodinated diagnostic agents]  06/18/2016  . Monosodium glutamate  06/18/2016  . Penicillins  06/18/2016  . Shellfish allergy  06/18/2016  . Zostavax [zoster vaccine live]  06/18/2016     Review of Systems:    This is positive for those things mentioned in the HPI      Physical Exam:  Vital signs in last 24 hours: Temp:  [97.6 F (36.4 C)-98.5 F (  36.9 C)] 97.6 F (36.4 C) (03/18 0503) Pulse Rate:  [66-78] 66 (03/18 0910) Resp:  [13-22] 18 (03/18 0503) BP: (135-204)/(74-87) 160/75 (03/18 0910) SpO2:  [95 %-100 %] 100 % (03/18 0910) Weight:  [61.3 kg (135 lb 1.6 oz)-64 kg (141 lb)] 61.3 kg (135 lb 1.6 oz) (03/17 2232) Last BM Date: 06/18/16  General:  Well-developed, well-nourished and in no acute distress Eyes:  anicteric. ENT:   Mouth and posterior pharynx free of lesions.  Neck:   supple w/o thyromegaly or mass.  Lungs: Clear to auscultation bilaterally. Heart:  S1S2, no rubs, murmurs, gallops. Abdomen:  soft, tender left side of abdomen, no hepatosplenomegaly, hernia, or mass and BS+.  Rectal: Lymph:  no cervical or supraclavicular adenopathy. Extremities:   no edema Skin   no rash. Neuro:  A&O x 3.  Psych:  appropriate mood and  Affect.   Data Reviewed:   LAB RESULTS:  Recent Labs  06/18/16 1737 06/19/16 0403  WBC 5.4 5.3  HGB 11.0* 9.2*  HCT 32.2* 27.9*  PLT 203 182   BMET  Recent Labs  06/18/16 1737 06/19/16 0403  NA 135 139  K 3.9 3.5  CL 103 107  CO2 25 27  GLUCOSE 117* 108*  BUN 10 10  CREATININE 0.95 0.92  CALCIUM 9.7 8.5*   LFT  Recent Labs  06/19/16 0403  PROT 7.8  ALBUMIN 2.8*  AST 17  ALT 11*  ALKPHOS 47  BILITOT 0.4   PT/INR No results for input(s): LABPROT, INR in the last 72 hours.  STUDIES: Ct Abdomen Pelvis Wo Contrast  Result Date:  06/18/2016 CLINICAL DATA:  Abdominal pain.  History of diverticulitis. EXAM: CT ABDOMEN AND PELVIS WITHOUT CONTRAST TECHNIQUE: Multidetector CT imaging of the abdomen and pelvis was performed following the standard protocol without IV contrast. COMPARISON:  June 26, 2014 FINDINGS: Lower chest: Mild reticular changes in the lung bases are similar, likely fibrosis, unchanged in the interval. No infiltrate. No suspicious nodules or masses. There is a small hiatal hernia. The visualized heart is unchanged. Hepatobiliary: No focal liver abnormality is seen. No gallstones, gallbladder wall thickening, or biliary dilatation. Pancreas: Unremarkable. No pancreatic ductal dilatation or surrounding inflammatory changes. Spleen: Normal in size without focal abnormality. Adrenals/Urinary Tract: Adrenal glands are unremarkable. Kidneys are normal, without renal calculi, focal lesion, or hydronephrosis. Bladder is unremarkable. Stomach/Bowel: There is a posterior gastric diverticulum without associated inflammation seen on image 20 of series 2. The stomach and small bowel are otherwise normal. The rectum and distal sigmoid colon are normal. There is thickening associated with the mid to distal sigmoid colon as seen on series 2, image 65 and coronal images 39 through 47. Proximal to this region of thickening, there is marked fecal loading in the more proximal sigmoid colon. More proximally, fecal loading is seen from the cecum into the distal descending colon other than a small segment of decompressed ascending colon. The appendix is not seen but there is no secondary evidence of appendicitis. There is slight increased attenuation adjacent to the descending colon and sigmoid colon with no focal inflammation identified. No pneumatosis. Vascular/Lymphatic: Atherosclerosis is seen in the abdominal aorta which is non aneurysmal, extending into the iliac vessels. No adenopathy is identified. Reproductive: The patient is status post  hysterectomy. Other: No abdominal wall hernia or abnormality. No abdominopelvic ascites. Musculoskeletal: Vertebroplasty at L2. No other acute bony abnormalities. IMPRESSION: 1. There is thickening associated with the mid to distal sigmoid colon which could be due to  stenosis secondary to previous chronic episodes of diverticulitis. However, underlying neoplasm is not excluded. This thickening results in fecal loading throughout the colon, most marked in the sigmoid colon just proximal to the region of thickening. Recommend colonoscopy after resolution of symptoms to exclude underlying neoplasm if 1 has not recently been performed. 2. Mild increased attenuation in the fat adjacent to the descending colon and sigmoid colon is likely reactive to the marked fecal loading and relative obstruction caused by the thickening in the mid to distal sigmoid colon. No focal diverticulitis. 3. Atherosclerosis. Electronically Signed   By: Dorise Bullion III M.D   On: 06/18/2016 19:00     PREVIOUS ENDOSCOPIES:                Thanks   LOS: 1 day   San Jetty MD  @  06/19/2016, 10:24 AM

## 2016-06-19 NOTE — Progress Notes (Signed)
Chaplain received a RR page for a Pt in [facility]. When the [facility] arrived the medical team was examining the Pt. Pt was alert and talking. Pt's family not present. CH provided encouragement, comfort and ministry of presence.    06/19/16 0500  Clinical Encounter Type  Visited With Patient;Health care provider  Visit Type Initial;Social support;Trauma;Other (Comment)  Referral From Nurse  Consult/Referral To [place] (Comment)

## 2016-06-19 NOTE — Progress Notes (Signed)
Pt became unresponsive in bathroom following a bowel movement on the toilet. Pt had stood up with staff at her side. Pt then began to lean forward per nurse tech and then the nurse tech helped to slowly lower her to the floor. Other staff responded and pt was pale and initially unresponsive to staff. A rapid response was called. Pts vital signs were taken with the blood pressure being lower at 95/44. Pt began waking up and able to respond to staff questions. Alert and orientated x4. Pt ordered to have a 500 mL bolus. Pt then was taken back to her bed via a wheelchair. Pt reported that she felt very weak and tired with nausea but no pain. Pt placed on cardiac monitoring. Pt now resting queitly .

## 2016-06-20 MED ORDER — CIPROFLOXACIN HCL 500 MG PO TABS
500.0000 mg | ORAL_TABLET | Freq: Two times a day (BID) | ORAL | Status: DC
  Administered 2016-06-20 – 2016-06-21 (×3): 500 mg via ORAL
  Filled 2016-06-20 (×3): qty 1

## 2016-06-20 MED ORDER — METRONIDAZOLE 500 MG PO TABS
500.0000 mg | ORAL_TABLET | Freq: Three times a day (TID) | ORAL | Status: DC
  Administered 2016-06-20 – 2016-06-21 (×4): 500 mg via ORAL
  Filled 2016-06-20 (×4): qty 1

## 2016-06-20 NOTE — Progress Notes (Signed)
Pharmacy Antibiotic Note  Janet Duffy is a 83 y.o. female admitted on 06/18/2016 with Diverticulitis.  Pharmacy has been consulted for Cipro dosing.  This is day #2 of antibiotics. Patient is also prescribed metronidazole 500 mg IV q8h  Plan: Convert to ciprofloxacin 500 mg PO BID as patient meets criteria for change to PO ABX per protocol.   Also converted metronidazole to PO.  Height: 5\' 4"  (162.6 cm) Weight: 135 lb 1.6 oz (61.3 kg) IBW/kg (Calculated) : 54.7  Temp (24hrs), Avg:98.3 F (36.8 C), Min:98.1 F (36.7 C), Max:98.6 F (37 C)   Recent Labs Lab 06/18/16 1737 06/19/16 0403  WBC 5.4 5.3  CREATININE 0.95 0.92    Estimated Creatinine Clearance: 40 mL/min (by C-G formula based on SCr of 0.92 mg/dL).    Allergies  Allergen Reactions  . Amlodipine   . Avelox [Moxifloxacin Hcl In Nacl]   . Clindamycin/Lincomycin   . Codeine   . Erythromycin   . Ivp Dye [Iodinated Diagnostic Agents]   . Monosodium Glutamate   . Penicillins   . Shellfish Allergy   . Zostavax [Zoster Vaccine Live]     Thank you for allowing pharmacy to be a part of this patient's care.  Lenis Noon, PharmD, BCPS Clinical Pharmacist 06/20/2016

## 2016-06-20 NOTE — Progress Notes (Signed)
Janet Duffy at [place] NAME: Janet Duffy    MR#:  [id number]  DATE OF BIRTH:  [birth year]  SUBJECTIVE:  CHIEF COMPLAINT:   Chief Complaint  Patient presents with  . Abdominal Pain  feels better, sitting in chair. Husband at bedside REVIEW OF SYSTEMS:    Review of Systems  Constitutional: Positive for malaise/fatigue. Negative for chills and fever.  HENT: Negative for sore throat.   Eyes: Negative for blurred vision, double vision and pain.  Respiratory: Negative for cough, hemoptysis, shortness of breath and wheezing.   Cardiovascular: Negative for chest pain, palpitations, orthopnea and leg swelling.  Gastrointestinal: Positive for abdominal pain. Negative for constipation, diarrhea, heartburn, nausea and vomiting.  Genitourinary: Negative for dysuria and hematuria.  Musculoskeletal: Negative for back pain and joint pain.  Skin: Negative for rash.  Neurological: Negative for sensory change, speech change, focal weakness and headaches.  Endo/Heme/Allergies: Does not bruise/bleed easily.  Psychiatric/Behavioral: Negative for depression. The patient is not nervous/anxious.     DRUG ALLERGIES:   Allergies  Allergen Reactions  . Amlodipine   . Avelox [Moxifloxacin Hcl In Nacl]   . Clindamycin/Lincomycin   . Codeine   . Erythromycin   . Ivp Dye [Iodinated Diagnostic Agents]   . Monosodium Glutamate   . Penicillins   . Shellfish Allergy   . Zostavax [Zoster Vaccine Live]     VITALS:  Blood pressure (!) 159/72, pulse 60, temperature 98.1 F (36.7 C), temperature source Oral, resp. rate 16, height 5\' 4"  (1.626 m), weight 61.3 kg (135 lb 1.6 oz), SpO2 99 %.  PHYSICAL EXAMINATION:   Physical Exam  GENERAL:  83 y.o.-year-old patient lying in the bed with no acute distress.  EYES: Pupils equal, round, reactive to light and accommodation. No scleral icterus. Extraocular muscles intact.  HEENT: Head atraumatic, normocephalic.  Oropharynx and nasopharynx clear.  NECK:  Supple, no jugular venous distention. No thyroid enlargement, no tenderness.  LUNGS: Normal breath sounds bilaterally, no wheezing, rales, rhonchi. No use of accessory muscles of respiration.  CARDIOVASCULAR: S1, S2 normal. No murmurs, rubs, or gallops.  ABDOMEN: Soft, nondistended. Bowel sounds present. No organomegaly or mass. Lower abdominal tenderness EXTREMITIES: No cyanosis, clubbing or edema b/l.    NEUROLOGIC: Cranial nerves II through XII are intact. No focal Motor or sensory deficits b/l.   PSYCHIATRIC: The patient is alert and oriented x 3.  SKIN: No obvious rash, lesion, or ulcer.   LABORATORY PANEL:   CBC  Recent Labs Lab 06/19/16 0403  WBC 5.3  HGB 9.2*  HCT 27.9*  PLT 182   ------------------------------------------------------------------------------------------------------------------ Chemistries   Recent Labs Lab 06/19/16 0403  NA 139  K 3.5  CL 107  CO2 27  GLUCOSE 108*  BUN 10  CREATININE 0.92  CALCIUM 8.5*  AST 17  ALT 11*  ALKPHOS 47  BILITOT 0.4   ------------------------------------------------------------------------------------------------------------------  Cardiac Enzymes No results for input(s): TROPONINI in the last 168 hours. ------------------------------------------------------------------------------------------------------------------  RADIOLOGY:  Ct Abdomen Pelvis Wo Contrast  Result Date: 06/18/2016 CLINICAL DATA:  Abdominal pain.  History of diverticulitis. EXAM: CT ABDOMEN AND PELVIS WITHOUT CONTRAST TECHNIQUE: Multidetector CT imaging of the abdomen and pelvis was performed following the standard protocol without IV contrast. COMPARISON:  June 26, 2014 FINDINGS: Lower chest: Mild reticular changes in the lung bases are similar, likely fibrosis, unchanged in the interval. No infiltrate. No suspicious nodules or masses. There is a small hiatal hernia. The visualized heart is unchanged.  Hepatobiliary: No  focal liver abnormality is seen. No gallstones, gallbladder wall thickening, or biliary dilatation. Pancreas: Unremarkable. No pancreatic ductal dilatation or surrounding inflammatory changes. Spleen: Normal in size without focal abnormality. Adrenals/Urinary Tract: Adrenal glands are unremarkable. Kidneys are normal, without renal calculi, focal lesion, or hydronephrosis. Bladder is unremarkable. Stomach/Bowel: There is a posterior gastric diverticulum without associated inflammation seen on image 20 of series 2. The stomach and small bowel are otherwise normal. The rectum and distal sigmoid colon are normal. There is thickening associated with the mid to distal sigmoid colon as seen on series 2, image 65 and coronal images 39 through 47. Proximal to this region of thickening, there is marked fecal loading in the more proximal sigmoid colon. More proximally, fecal loading is seen from the cecum into the distal descending colon other than a small segment of decompressed ascending colon. The appendix is not seen but there is no secondary evidence of appendicitis. There is slight increased attenuation adjacent to the descending colon and sigmoid colon with no focal inflammation identified. No pneumatosis. Vascular/Lymphatic: Atherosclerosis is seen in the abdominal aorta which is non aneurysmal, extending into the iliac vessels. No adenopathy is identified. Reproductive: The patient is status post hysterectomy. Other: No abdominal wall hernia or abnormality. No abdominopelvic ascites. Musculoskeletal: Vertebroplasty at L2. No other acute bony abnormalities. IMPRESSION: 1. There is thickening associated with the mid to distal sigmoid colon which could be due to stenosis secondary to previous chronic episodes of diverticulitis. However, underlying neoplasm is not excluded. This thickening results in fecal loading throughout the colon, most marked in the sigmoid colon just proximal to the region of  thickening. Recommend colonoscopy after resolution of symptoms to exclude underlying neoplasm if 1 has not recently been performed. 2. Mild increased attenuation in the fat adjacent to the descending colon and sigmoid colon is likely reactive to the marked fecal loading and relative obstruction caused by the thickening in the mid to distal sigmoid colon. No focal diverticulitis. 3. Atherosclerosis. Electronically Signed   By: Dorise Bullion III M.D   On: 06/18/2016 19:00   ASSESSMENT AND PLAN:  This is a 83 y.o. female with a history of DDD, granuloma annulare, HTN, osteoporosis, chronic ischemic colitis, iron deficiency anemia, atrial fibrillation, undifferentiated connective tissue disease,   now being admitted with:  #. Acute diverticulitis - change to PO Cipro and Flagyl - tolerated FLD - advance to soft diet -GI consultation - Discussed with Dr. Allen Norris. Advised conservative management.   #. Constipation - resolved  #. Anemia, chronic, stable - Monitor CBC  #. History of hypertension - Continue hydralazine, lisinopril, Coreg, spironolactone  #. History of GERD - Continue Prilosec  #. History of atrial fibrillation - Continue Coreg  # Vasovagal syncope on the commode in the hospital. Orthostats negative. Resolved - DC tele   All the records are reviewed and case discussed with Care Management/Social Worker. Management plans discussed with the patient, family (husband at bedside) and they are in agreement.  CODE STATUS: FULL CODE  DVT Prophylaxis: SCDs  TOTAL TIME TAKING CARE OF THIS PATIENT: 20 minutes.   POSSIBLE D/C IN 1 DAYS, DEPENDING ON CLINICAL CONDITION.  Max Sane M.D on 06/20/2016 at 5:03 PM  Between 7am to 6pm - Pager - [phone]  After 6pm go to [email or web address] - password [email or web address] [facility]  Office  [phone]  CC: Primary care physician; Don Broach, MD  Note: This dictation was prepared with Dragon dictation along  with smaller phrase technology. Any  transcriptional errors that result from this process are unintentional.

## 2016-06-20 NOTE — Evaluation (Signed)
Physical Therapy Evaluation Patient Details Name: Lenna Hagarty MRN: [id number] DOB: 1932/03/24 Today's Date: 06/20/2016   History of Present Illness  presented to ER secondary to LLQ, abdominal pain; admitted with acute diverticulitis.  Hospital course significant for syncopal episode x2 (likely vasovagal per notes).  Clinical Impression  Upon evaluation, patient alert and oriented; follows all commands and demonstrates good insight/safety awareness.  Bilat UE/LE strength and ROM grossly WFL and symmetrical; no focal weakness, sensory deficit noted.  Able to complete bed mobility indep; sit/stand, basic transfers and gait (220') without assist device, distant sup/mod indep.  Slightly guarded, but no overt buckling or LOB.  Generally fatigued with effort (BORG 5/10) due to acute illness; mild reports of dizziness, but no significant orthostasis noted (vitals stable and WFL). Would benefit from skilled PT to address above deficits (limited mobility, cardiopulmonary endurance) and promote optimal return to PLOF; Will maintain on caseload during remaining hospitalization to promote continued mobility and maintain UB/LB strength; anticipate no skilled PT needs upon discharge.    Follow Up Recommendations No PT follow up (will maintain on caseload for remaining hospitalization; no PT follow up required upon discharge)    Equipment Recommendations       Recommendations for Other Services       Precautions / Restrictions Precautions Precautions: Fall Restrictions Weight Bearing Restrictions: No      Mobility  Bed Mobility Overal bed mobility: Independent                Transfers Overall transfer level: Modified independent               General transfer comment: Sit/stand without assist device, mod indep; good LE strength/power with movement transition  Ambulation/Gait Ambulation/Gait assistance: Supervision Ambulation Distance (Feet): 220 Feet         General Gait  Details: reciprocal stepping pattern; slightly slow and guarded, but no overt buckling or LOB.  Stairs            Wheelchair Mobility    Modified Rankin (Stroke Patients Only)       Balance Overall balance assessment: Needs assistance Sitting-balance support: No upper extremity supported;Feet supported Sitting balance-Leahy Scale: Normal     Standing balance support: No upper extremity supported Standing balance-Leahy Scale: Good                               Pertinent Vitals/Pain Pain Assessment: 0-10 Pain Score: 3  Pain Location: abdomen Pain Descriptors / Indicators: Aching Pain Intervention(s): Limited activity within patient's tolerance;Monitored during session;Repositioned    Home Living Family/patient expects to be discharged to:: Private residence Living Arrangements: Spouse/significant other Available Help at Discharge: Family Type of Home: House Home Access: Stairs to enter Entrance Stairs-Rails: Right Entrance Stairs-Number of Steps: 2 Home Layout: One level Home Equipment: None      Prior Function Level of Independence: Independent         Comments: Indep with ADLs, household and community activities; working part-time at [employer].  Walks daily; enjoys staying active.     Hand Dominance        Extremity/Trunk Assessment   Upper Extremity Assessment Upper Extremity Assessment: Overall WFL for tasks assessed    Lower Extremity Assessment Lower Extremity Assessment: Overall WFL for tasks assessed (grossly at least 4+/5 throughout)       Communication      Cognition Arousal/Alertness: Awake/alert Behavior During Therapy: WFL for tasks assessed/performed Overall Cognitive Status:  Within Functional Limits for tasks assessed                      General Comments      Exercises Other Exercises Other Exercises: Toilet transfer, ambulatory without assist device, sup/mod indep; standing balance for  peri-care, clothing management and hand hygiene (at sink), close sup/mod indep.  good awareness of stability; fair comfort/confidence in functional movement.   Assessment/Plan    PT Assessment Patient needs continued PT services  PT Problem List Decreased activity tolerance;Decreased balance;Decreased mobility       PT Treatment Interventions Gait training;Stair training;Functional mobility training;Therapeutic activities;Therapeutic exercise;Balance training;Patient/family education    PT Goals (Current goals can be found in the Care Plan section)  Acute Rehab PT Goals Patient Stated Goal: to return home PT Goal Formulation: With patient Time For Goal Achievement: 07/04/16 Potential to Achieve Goals: Good    Frequency Min 2X/week   Barriers to discharge        Co-evaluation               End of Session Equipment Utilized During Treatment: Gait belt Activity Tolerance: Patient tolerated treatment well Patient left: in chair;with call bell/phone within reach;with chair alarm set   PT Visit Diagnosis: Muscle weakness (generalized) (M62.81)         Time: 4388-8757 PT Time Calculation (min) (ACUTE ONLY): 19 min   Charges:   PT Evaluation $PT Eval Low Complexity: 1 Procedure PT Treatments $Therapeutic Activity: 8-22 mins   PT G Codes:         Jocelynne Duquette H. Owens Shark, PT, DPT, NCS 06/20/16, 3:35 PM [phone]

## 2016-06-21 DIAGNOSIS — R1032 Left lower quadrant pain: Secondary | ICD-10-CM | POA: Diagnosis not present

## 2016-06-21 DIAGNOSIS — K5732 Diverticulitis of large intestine without perforation or abscess without bleeding: Secondary | ICD-10-CM | POA: Diagnosis not present

## 2016-06-21 LAB — BASIC METABOLIC PANEL
Anion gap: 5 (ref 5–15)
BUN: 6 mg/dL (ref 6–20)
CALCIUM: 8.5 mg/dL — AB (ref 8.9–10.3)
CHLORIDE: 107 mmol/L (ref 101–111)
CO2: 26 mmol/L (ref 22–32)
CREATININE: 0.95 mg/dL (ref 0.44–1.00)
GFR calc non Af Amer: 54 mL/min — ABNORMAL LOW (ref >60)
Glucose, Bld: 107 mg/dL — ABNORMAL HIGH (ref 65–99)
Potassium: 3.6 mmol/L (ref 3.5–5.1)
Sodium: 138 mmol/L (ref 135–145)

## 2016-06-21 LAB — CBC
HEMATOCRIT: 28.7 % — AB (ref 35.0–47.0)
HEMOGLOBIN: 9.5 g/dL — AB (ref 12.0–16.0)
MCH: 25.7 pg — AB (ref 26.0–34.0)
MCHC: 33.3 g/dL (ref 32.0–36.0)
MCV: 77.2 fL — ABNORMAL LOW (ref 80.0–100.0)
Platelets: 178 10*3/uL (ref 150–440)
RBC: 3.72 MIL/uL — ABNORMAL LOW (ref 3.80–5.20)
RDW: 20.1 % — AB (ref 11.5–14.5)
WBC: 3.6 10*3/uL (ref 3.6–11.0)

## 2016-06-21 MED ORDER — ONDANSETRON HCL 4 MG PO TABS: 4.0000 mg | ORAL_TABLET | Freq: Four times a day (QID) | ORAL | 0 refills | Status: DC | PRN

## 2016-06-21 MED ORDER — CIPROFLOXACIN HCL 500 MG PO TABS: 500.0000 mg | ORAL_TABLET | Freq: Two times a day (BID) | ORAL | 0 refills | Status: DC

## 2016-06-21 MED ORDER — METRONIDAZOLE 500 MG PO TABS: 500.0000 mg | ORAL_TABLET | Freq: Three times a day (TID) | ORAL | 0 refills | Status: DC

## 2016-06-21 NOTE — Progress Notes (Signed)
Patient discharged to home as ordered. Patient given discharge instructions on medication and patient states that she has not had any problems taking the CIpro and Flagyl.. No nausea or vomiting noted.Patient given follow up appointments as ordered. Patient is alert and oriented, ambulates well, no c/o dizziness noted. Patient husband at the bedside.

## 2016-06-21 NOTE — Care Management Important Message (Signed)
Important Message  Patient Details  Name: Janet Duffy MRN: [id number] Date of Birth: July 25, 1931   Medicare Important Message Given:  Yes    Beverly Sessions, RN 06/21/2016, 11:18 AM

## 2016-06-21 NOTE — Discharge Instructions (Signed)

## 2016-06-21 NOTE — Progress Notes (Addendum)
Patient awake and alert and ate all of her breakfast this AM . Patient tolerated her medication this morning no complaints of nausea or vomiting noted

## 2016-06-22 NOTE — Discharge Summary (Signed)
[place] at [place] NAME: Janet Duffy    MR#:  [id number]  DATE OF BIRTH:  Apr 14, 1931  DATE OF ADMISSION:  06/18/2016   ADMITTING PHYSICIAN: Harvie Bridge, DO  DATE OF DISCHARGE: 06/21/2016  1:47 PM  PRIMARY CARE PHYSICIAN: Don Broach, MD   ADMISSION DIAGNOSIS:  Left lower quadrant pain [R10.32] Diverticulitis of large intestine without perforation or abscess without bleeding [K57.32] DISCHARGE DIAGNOSIS:  Active Problems:   Acute diverticulitis  SECONDARY DIAGNOSIS:   Past Medical History:  Diagnosis Date  . Connective tissue disease ([place])   . DDD (degenerative disc disease), lumbar   . Granuloma annulare   . HTN (hypertension)   . Osteoporosis    HOSPITAL COURSE:  This is a 83 y.o.femalewith a history of DDD, granuloma annulare, HTN, osteoporosis, chronic ischemic colitis, iron deficiency anemia, atrial fibrillation, undifferentiated connective tissue disease admitted for  #. Acute diverticulitis - Treated with Cipro and Flagyl and being D/C on same - tolerated diet -GI Dr. Allen Norris. Advised conservative management.   #. Constipation - resolved DISCHARGE CONDITIONS:  stable CONSULTS OBTAINED:  Treatment Team:  San Jetty, MD DRUG ALLERGIES:   Allergies  Allergen Reactions  . Amlodipine   . Avelox [Moxifloxacin Hcl In Nacl]   . Clindamycin/Lincomycin   . Codeine   . Erythromycin   . Ivp Dye [Iodinated Diagnostic Agents]   . Monosodium Glutamate   . Penicillins   . Shellfish Allergy   . Zostavax [Zoster Vaccine Live]    DISCHARGE MEDICATIONS:   Allergies as of 06/21/2016      Reactions   Amlodipine    Avelox [moxifloxacin Hcl In Nacl]    Clindamycin/lincomycin    Codeine    Erythromycin    Ivp Dye [iodinated Diagnostic Agents]    Monosodium Glutamate    Penicillins    Shellfish Allergy    Zostavax [zoster Vaccine Live]       Medication List    STOP taking these medications     potassium chloride SA 20 MEQ tablet Commonly known as:  K-DUR,KLOR-CON   prednisoLONE acetate 1 % ophthalmic suspension Commonly known as:  PRED FORTE     TAKE these medications   carvedilol 25 MG tablet Commonly known as:  COREG Take 25 mg by mouth 2 (two) times daily.   ciprofloxacin 500 MG tablet Commonly known as:  CIPRO Take 1 tablet (500 mg total) by mouth 2 (two) times daily.   hydrALAZINE 50 MG tablet Commonly known as:  APRESOLINE Take 50 mg by mouth 3 (three) times daily.   lisinopril 20 MG tablet Commonly known as:  PRINIVIL,ZESTRIL Take 20 mg by mouth daily.   metroNIDAZOLE 500 MG tablet Commonly known as:  FLAGYL Take 1 tablet (500 mg total) by mouth 3 (three) times daily.   nitroGLYCERIN 0.4 MG SL tablet Commonly known as:  NITROSTAT Place 0.4 mg under the tongue every 5 (five) minutes as needed for chest pain.   omeprazole 20 MG capsule Commonly known as:  PRILOSEC Take 20 mg by mouth daily.   ondansetron 4 MG tablet Commonly known as:  ZOFRAN Take 1 tablet (4 mg total) by mouth every 6 (six) hours as needed for nausea.   psyllium 0.52 g capsule Commonly known as:  REGULOID Take 0.52 g by mouth 2 (two) times daily.   spironolactone 25 MG tablet Commonly known as:  ALDACTONE Take 25 mg by mouth daily. '   Vitamin D3 1000 units Caps Take  1,000 Units by mouth daily.        DISCHARGE INSTRUCTIONS:   DIET:  Regular diet DISCHARGE CONDITION:  Good ACTIVITY:  Activity as tolerated OXYGEN:  Home Oxygen: No.  Oxygen Delivery: room air DISCHARGE LOCATION:  home   If you experience worsening of your admission symptoms, develop shortness of breath, life threatening emergency, suicidal or homicidal thoughts you must seek medical attention immediately by calling 911 or calling your MD immediately  if symptoms less severe.  You Must read complete instructions/literature along with all the possible adverse reactions/side effects for all the  Medicines you take and that have been prescribed to you. Take any new Medicines after you have completely understood and accpet all the possible adverse reactions/side effects.   Please note  You were cared for by a hospitalist during your hospital stay. If you have any questions about your discharge medications or the care you received while you were in the hospital after you are discharged, you can call the unit and asked to speak with the hospitalist on call if the hospitalist that took care of you is not available. Once you are discharged, your primary care physician will handle any further medical issues. Please note that NO REFILLS for any discharge medications will be authorized once you are discharged, as it is imperative that you return to your primary care physician (or establish a relationship with a primary care physician if you do not have one) for your aftercare needs so that they can reassess your need for medications and monitor your lab values.    On the day of Discharge:  VITAL SIGNS:  Blood pressure (!) 157/81, pulse 69, temperature 97.7 F (36.5 C), temperature source Oral, resp. rate 18, height 5\' 4"  (1.626 m), weight 61.3 kg (135 lb 1.6 oz), SpO2 100 %. PHYSICAL EXAMINATION:  GENERAL:  83 y.o.-year-old patient lying in the bed with no acute distress.  EYES: Pupils equal, round, reactive to light and accommodation. No scleral icterus. Extraocular muscles intact.  HEENT: Head atraumatic, normocephalic. Oropharynx and nasopharynx clear.  NECK:  Supple, no jugular venous distention. No thyroid enlargement, no tenderness.  LUNGS: Normal breath sounds bilaterally, no wheezing, rales,rhonchi or crepitation. No use of accessory muscles of respiration.  CARDIOVASCULAR: S1, S2 normal. No murmurs, rubs, or gallops.  ABDOMEN: Soft, non-tender, non-distended. Bowel sounds present. No organomegaly or mass.  EXTREMITIES: No pedal edema, cyanosis, or clubbing.  NEUROLOGIC: Cranial nerves  II through XII are intact. Muscle strength 5/5 in all extremities. Sensation intact. Gait not checked.  PSYCHIATRIC: The patient is alert and oriented x 3.  SKIN: No obvious rash, lesion, or ulcer.  DATA REVIEW:   CBC  Recent Labs Lab 06/21/16 0520  WBC 3.6  HGB 9.5*  HCT 28.7*  PLT 178    Chemistries   Recent Labs Lab 06/19/16 0403 06/21/16 0520  NA 139 138  K 3.5 3.6  CL 107 107  CO2 27 26  GLUCOSE 108* 107*  BUN 10 6  CREATININE 0.92 0.95  CALCIUM 8.5* 8.5*  AST 17  --   ALT 11*  --   ALKPHOS 47  --   BILITOT 0.4  --      Follow-up Information    Don Broach, MD. Go on 06/23/2016.   Specialty:  Internal Medicine Why:  Thursday at 8:40am for hospital follow-up Contact information: [place] [address] [phone]        Joaquim Nam, MD. Go on  07/08/2016.   Specialty:  Cardiology Why:  Friday at 9:30am  Contact information: [place] [address] [phone]        Candis Shine, MD. Go on 06/27/2016.   Specialty:  Internal Medicine Why:  Monday at 2:00pm for hospital follow-up Contact information: [place] [address] [phone]           Management plans discussed with the patient, family and they are in agreement.  CODE STATUS: Prior   TOTAL TIME TAKING CARE OF THIS PATIENT: 45 minutes.    Max Sane M.D on 06/22/2016 at 10:56 PM  Between 7am to 6pm - Pager - [phone]  After 6pm go to [email or web address] - Proofreader  [facility]  Office  [phone]  CC: Primary care physician; Don Broach, MD   Note: This dictation was prepared with Dragon dictation along with smaller phrase technology. Any transcriptional errors that result from this process are unintentional.

## 2016-07-21 ENCOUNTER — Emergency Department: Payer: Medicare HMO

## 2016-07-21 ENCOUNTER — Encounter: Payer: Self-pay | Admitting: Emergency Medicine

## 2016-07-21 ENCOUNTER — Emergency Department
Admission: EM | Admit: 2016-07-21 | Discharge: 2016-07-21 | Disposition: A | Discharge status: Home / Self Care | Payer: Medicare HMO | Attending: Emergency Medicine | Admitting: Emergency Medicine

## 2016-07-21 DIAGNOSIS — K5900 Constipation, unspecified: Secondary | ICD-10-CM

## 2016-07-21 DIAGNOSIS — R1084 Generalized abdominal pain: Secondary | ICD-10-CM | POA: Diagnosis present

## 2016-07-21 DIAGNOSIS — Z79899 Other long term (current) drug therapy: Secondary | ICD-10-CM | POA: Diagnosis not present

## 2016-07-21 DIAGNOSIS — I10 Essential (primary) hypertension: Secondary | ICD-10-CM | POA: Diagnosis not present

## 2016-07-21 DIAGNOSIS — K5790 Diverticulosis of intestine, part unspecified, without perforation or abscess without bleeding: Secondary | ICD-10-CM | POA: Insufficient documentation

## 2016-07-21 DIAGNOSIS — K5792 Diverticulitis of intestine, part unspecified, without perforation or abscess without bleeding: Secondary | ICD-10-CM

## 2016-07-21 HISTORY — DX: Diverticulitis of intestine, part unspecified, without perforation or abscess without bleeding: K57.92

## 2016-07-21 LAB — CBC WITH DIFFERENTIAL/PLATELET
BASOS ABS: 0 10*3/uL (ref 0–0.1)
Basophils Relative: 1 %
Eosinophils Absolute: 0.1 10*3/uL (ref 0–0.7)
Eosinophils Relative: 1 %
HEMATOCRIT: 30 % — AB (ref 35.0–47.0)
Hemoglobin: 10.1 g/dL — ABNORMAL LOW (ref 12.0–16.0)
LYMPHS ABS: 0.9 10*3/uL — AB (ref 1.0–3.6)
Lymphocytes Relative: 19 %
MCH: 25.9 pg — ABNORMAL LOW (ref 26.0–34.0)
MCHC: 33.6 g/dL (ref 32.0–36.0)
MCV: 77.1 fL — ABNORMAL LOW (ref 80.0–100.0)
MONO ABS: 0.6 10*3/uL (ref 0.2–0.9)
Monocytes Relative: 12 %
NEUTROS ABS: 3.1 10*3/uL (ref 1.4–6.5)
NEUTROS PCT: 67 %
Platelets: 178 10*3/uL (ref 150–440)
RBC: 3.89 MIL/uL (ref 3.80–5.20)
RDW: 19.8 % — ABNORMAL HIGH (ref 11.5–14.5)
WBC: 4.6 10*3/uL (ref 3.6–11.0)

## 2016-07-21 LAB — COMPREHENSIVE METABOLIC PANEL
ALT: 12 U/L — AB (ref 14–54)
AST: 21 U/L (ref 15–41)
Albumin: 3.2 g/dL — ABNORMAL LOW (ref 3.5–5.0)
Alkaline Phosphatase: 50 U/L (ref 38–126)
Anion gap: 7 (ref 5–15)
BILIRUBIN TOTAL: 0.5 mg/dL (ref 0.3–1.2)
BUN: 9 mg/dL (ref 6–20)
CO2: 26 mmol/L (ref 22–32)
CREATININE: 0.82 mg/dL (ref 0.44–1.00)
Calcium: 9 mg/dL (ref 8.9–10.3)
Chloride: 102 mmol/L (ref 101–111)
GFR calc Af Amer: >60 mL/min (ref >60)
Glucose, Bld: 127 mg/dL — ABNORMAL HIGH (ref 65–99)
Potassium: 3.9 mmol/L (ref 3.5–5.1)
Sodium: 135 mmol/L (ref 135–145)
Total Protein: 8.5 g/dL — ABNORMAL HIGH (ref 6.5–8.1)

## 2016-07-21 LAB — LIPASE, BLOOD: LIPASE: 44 U/L (ref 11–51)

## 2016-07-21 LAB — LACTIC ACID, PLASMA: Lactic Acid, Venous: 0.9 mmol/L (ref 0.5–1.9)

## 2016-07-21 MED ORDER — OXYCODONE HCL 5 MG PO TABS: 5.0000 mg | ORAL_TABLET | Freq: Three times a day (TID) | ORAL | 0 refills | Status: AC | PRN

## 2016-07-21 MED ORDER — METRONIDAZOLE 500 MG PO TABS: 500.0000 mg | ORAL_TABLET | Freq: Three times a day (TID) | ORAL | 0 refills | Status: AC

## 2016-07-21 MED ORDER — MORPHINE SULFATE (PF) 2 MG/ML IV SOLN
INTRAVENOUS | Status: AC
  Administered 2016-07-21: 2 mg via INTRAVENOUS
  Filled 2016-07-21: qty 1

## 2016-07-21 MED ORDER — OXYCODONE HCL 5 MG PO TABS
5.0000 mg | ORAL_TABLET | Freq: Once | ORAL | Status: AC
  Administered 2016-07-21: 5 mg via ORAL
  Filled 2016-07-21: qty 1

## 2016-07-21 MED ORDER — ONDANSETRON HCL 4 MG/2ML IJ SOLN
INTRAMUSCULAR | Status: AC
  Administered 2016-07-21: 4 mg via INTRAVENOUS
  Filled 2016-07-21: qty 2

## 2016-07-21 MED ORDER — ONDANSETRON HCL 4 MG/2ML IJ SOLN
4.0000 mg | Freq: Once | INTRAMUSCULAR | Status: AC
  Administered 2016-07-21: 4 mg via INTRAVENOUS

## 2016-07-21 MED ORDER — SENNA 8.6 MG PO TABS: 1.0000 | ORAL_TABLET | Freq: Every day | ORAL | 0 refills | Status: DC

## 2016-07-21 MED ORDER — DOCUSATE SODIUM 100 MG PO CAPS: 100.0000 mg | ORAL_CAPSULE | Freq: Two times a day (BID) | ORAL | 0 refills | Status: AC

## 2016-07-21 MED ORDER — SULFAMETHOXAZOLE-TRIMETHOPRIM 800-160 MG PO TABS: 1.0000 | ORAL_TABLET | Freq: Two times a day (BID) | ORAL | 0 refills | Status: AC

## 2016-07-21 MED ORDER — SULFAMETHOXAZOLE-TRIMETHOPRIM 800-160 MG PO TABS
1.0000 | ORAL_TABLET | Freq: Once | ORAL | Status: AC
  Administered 2016-07-21: 1 via ORAL
  Filled 2016-07-21: qty 1

## 2016-07-21 MED ORDER — METRONIDAZOLE 500 MG PO TABS
500.0000 mg | ORAL_TABLET | Freq: Once | ORAL | Status: AC
  Administered 2016-07-21: 500 mg via ORAL
  Filled 2016-07-21: qty 1

## 2016-07-21 MED ORDER — ACETAMINOPHEN 500 MG PO TABS
1000.0000 mg | ORAL_TABLET | Freq: Once | ORAL | Status: AC
  Administered 2016-07-21: 1000 mg via ORAL
  Filled 2016-07-21: qty 2

## 2016-07-21 MED ORDER — SODIUM CHLORIDE 0.9 % IV BOLUS (SEPSIS)
1000.0000 mL | Freq: Once | INTRAVENOUS | Status: AC
  Administered 2016-07-21: 1000 mL via INTRAVENOUS

## 2016-07-21 MED ORDER — MORPHINE SULFATE (PF) 2 MG/ML IV SOLN
2.0000 mg | Freq: Once | INTRAVENOUS | Status: AC
  Administered 2016-07-21: 2 mg via INTRAVENOUS

## 2016-07-21 MED ORDER — ONDANSETRON 4 MG PO TBDP: 4.0000 mg | ORAL_TABLET | Freq: Three times a day (TID) | ORAL | 0 refills | Status: DC | PRN

## 2016-07-21 NOTE — ED Notes (Signed)
Pt tolerating PO fluids and crackers

## 2016-07-21 NOTE — ED Provider Notes (Signed)
[facility] Emergency Department Provider Note  ____________________________________________  Time seen: Approximately 7:49 AM  I have reviewed the triage vital signs and the nursing notes.   HISTORY  Chief Complaint Abdominal Pain   HPI Janet Duffy is a 83 y.o. female with a history of diverticulitis, hypertension, atrial fibrillation, chronic ischemic colitis, iron deficiency anemia, rectal bleeding who presents for evaluation of abdominal pain. Patient reports that she was seen a month ago at [facility] for the same exact complaint. The time she was diagnosed diverticulitis. She was put on antibiotics and fully recovered. She reports that she started having symptoms again for the last week which got severe this morning. She is complaining of cramping sharp diffuse abdominal pain that is worse on the left side of her abdomen associated with severe nausea. The pain is nonradiating. She denies vomiting, diarrhea, chest pain, shortness of breath, fever, chills, dysuria or hematuria. Patient does report constipation for 3 days which is atypical for her. She denies abdominal distention or prior history of SBO. She is passing flatus. She hasn't tried anything at home for the constipation. She has had hysterectomy and appendectomy in the past.  Past Medical History:  Diagnosis Date  . Connective tissue disease ([place])   . DDD (degenerative disc disease), lumbar   . Diverticulitis   . Granuloma annulare   . HTN (hypertension)   . Osteoporosis     Patient Active Problem List   Diagnosis Date Noted  . Acute diverticulitis 06/18/2016    History reviewed. No pertinent surgical history.  Prior to Admission medications   Medication Sig Start Date End Date Taking? Authorizing Provider  carvedilol (COREG) 25 MG tablet Take 25 mg by mouth 2 (two) times daily. 05/18/16   Historical Provider, MD  Cholecalciferol (VITAMIN D3) 1000 units CAPS Take 1,000 Units by mouth daily.     Historical Provider, MD  ciprofloxacin (CIPRO) 500 MG tablet Take 1 tablet (500 mg total) by mouth 2 (two) times daily. 06/21/16   Max Sane, MD  docusate sodium (COLACE) 100 MG capsule Take 1 capsule (100 mg total) by mouth 2 (two) times daily. 07/21/16 07/28/16  Rudene Re, MD  hydrALAZINE (APRESOLINE) 50 MG tablet Take 50 mg by mouth 3 (three) times daily. 05/24/16 05/24/17  Historical Provider, MD  lisinopril (PRINIVIL,ZESTRIL) 20 MG tablet Take 20 mg by mouth daily. 04/22/16 04/22/17  Historical Provider, MD  metroNIDAZOLE (FLAGYL) 500 MG tablet Take 1 tablet (500 mg total) by mouth 3 (three) times daily. 07/21/16 07/28/16  Rudene Re, MD  nitroGLYCERIN (NITROSTAT) 0.4 MG SL tablet Place 0.4 mg under the tongue every 5 (five) minutes as needed for chest pain.    Historical Provider, MD  omeprazole (PRILOSEC) 20 MG capsule Take 20 mg by mouth daily. 12/04/15   Historical Provider, MD  ondansetron (ZOFRAN ODT) 4 MG disintegrating tablet Take 1 tablet (4 mg total) by mouth every 8 (eight) hours as needed for nausea or vomiting. 07/21/16   Rudene Re, MD  ondansetron (ZOFRAN) 4 MG tablet Take 1 tablet (4 mg total) by mouth every 6 (six) hours as needed for nausea. 06/21/16   Max Sane, MD  oxyCODONE (ROXICODONE) 5 MG immediate release tablet Take 1 tablet (5 mg total) by mouth every 8 (eight) hours as needed. 07/21/16 07/21/17  Rudene Re, MD  psyllium (REGULOID) 0.52 g capsule Take 0.52 g by mouth 2 (two) times daily.    Historical Provider, MD  senna (SENOKOT) 8.6 MG TABS tablet Take 1  tablet (8.6 mg total) by mouth daily. 07/21/16   Rudene Re, MD  spironolactone (ALDACTONE) 25 MG tablet Take 25 mg by mouth daily. ' 06/03/16 06/03/17  Historical Provider, MD  sulfamethoxazole-trimethoprim (BACTRIM DS,SEPTRA DS) 800-160 MG tablet Take 1 tablet by mouth 2 (two) times daily. 07/21/16 07/28/16  Rudene Re, MD    Allergies Amlodipine; Avelox [moxifloxacin hcl in nacl];  Clindamycin/lincomycin; Codeine; Erythromycin; Ivp dye [iodinated diagnostic agents]; Monosodium glutamate; Penicillins; Shellfish allergy; and Zostavax [zoster vaccine live]  History reviewed. No pertinent family history.  Social History Social History  Substance Use Topics  . Smoking status: Never Smoker  . Smokeless tobacco: Never Used  . Alcohol use No    Review of Systems  Constitutional: Negative for fever. Eyes: Negative for visual changes. ENT: Negative for sore throat. Neck: No neck pain  Cardiovascular: Negative for chest pain. Respiratory: Negative for shortness of breath. Gastrointestinal: + diffuse abdominal pain and nausea and constipation. No vomiting or diarrhea. Genitourinary: Negative for dysuria. Musculoskeletal: Negative for back pain. Skin: Negative for rash. Neurological: Negative for headaches, weakness or numbness. Psych: No SI or HI  ____________________________________________   PHYSICAL EXAM:  VITAL SIGNS: ED Triage Vitals [07/21/16 0728]  Enc Vitals Group     BP (!) 151/81     Pulse Rate 83     Resp 18     Temp 98.2 F (36.8 C)     Temp Source Oral     SpO2 99 %     Weight 135 lb (61.2 kg)     Height 5\' 4"  (1.626 m)     Head Circumference      Peak Flow      Pain Score 10     Pain Loc      Pain Edu?      Excl. in [place]?     Constitutional: Alert and oriented. Well appearing and in no apparent distress. HEENT:      Head: Normocephalic and atraumatic.         Eyes: Conjunctivae are normal. Sclera is non-icteric. EOMI. PERRL      Mouth/Throat: Mucous membranes are moist.       Neck: Supple with no signs of meningismus. Cardiovascular: Regular rate and rhythm. No murmurs, gallops, or rubs. 2+ symmetrical distal pulses are present in all extremities. No JVD. Respiratory: Normal respiratory effort. Lungs are clear to auscultation bilaterally. No wheezes, crackles, or rhonchi.  Gastrointestinal: Soft, diffuse tenderness to palpation worse  on the left quadrants, non distended with positive bowel sounds. No rebound or guarding. Genitourinary: No CVA tenderness. Musculoskeletal: Nontender with normal range of motion in all extremities. No edema, cyanosis, or erythema of extremities. Neurologic: Normal speech and language. Face is symmetric. Moving all extremities. No gross focal neurologic deficits are appreciated. Skin: Skin is warm, dry and intact. No rash noted. Psychiatric: Mood and affect are normal. Speech and behavior are normal.  ____________________________________________   LABS (all labs ordered are listed, but only abnormal results are displayed)  Labs Reviewed  CBC WITH DIFFERENTIAL/PLATELET - Abnormal; Notable for the following:       Result Value   Hemoglobin 10.1 (*)    HCT 30.0 (*)    MCV 77.1 (*)    MCH 25.9 (*)    RDW 19.8 (*)    Lymphs Abs 0.9 (*)    All other components within normal limits  COMPREHENSIVE METABOLIC PANEL - Abnormal; Notable for the following:    Glucose, Bld 127 (*)    Total  Protein 8.5 (*)    Albumin 3.2 (*)    ALT 12 (*)    All other components within normal limits  LIPASE, BLOOD  LACTIC ACID, PLASMA  URINALYSIS, COMPLETE (UACMP) WITH MICROSCOPIC   ____________________________________________  EKG  ED ECG REPORT I, Rudene Re, the attending physician, personally viewed and interpreted this ECG.  Normal sinus rhythm, rate of 83, normal intervals, normal axis, T-wave inversion in V3 and aVF, no ST elevations or depressions. ____________________________________________  RADIOLOGY  CT a/p: Mild diverticulitis of descending and proximal transverse colon, mildly increased since prior exam. No evidence of abscess or other complication.  Large stool burden noted; recommend clinical correlation for possible constipation.  Stable small gastric fundal diverticulum and tiny hiatal hernia.  Aortic atherosclerosis.   ____________________________________________   PROCEDURES  Procedure(s) performed: None Procedures Critical Care performed:  None ____________________________________________   INITIAL IMPRESSION / ASSESSMENT AND PLAN / ED COURSE  83 y.o. female with a history of diverticulitis, hypertension, atrial fibrillation, chronic ischemic colitis, iron deficiency anemia, rectal bleeding who presents for evaluation of abdominal pain associated with nausea and 3 days of constipation. Patient is well-appearing, in no distress, is normal vital signs, she has diffuse tenderness palpation of her abdomen worse on the left quadrants with no rebound or guarding. We'll give IV fluids, IV morphine, and IV Zofran for symptom relief. We'll check CBC, CMP, lipase, lactate, urinalysis, and CT abdomen and pelvis. Patient has an allergic reaction to IV dye therefore CT to be done without contrast. Differential diagnoses including diverticulitis versus SBO versus constipation versus pancreatitis.  Clinical Course as of Jul 21 944  Thu Jul 21, 2016  0849 Labs with no acute findings including normal white count, normal lactic acid. Vital signs remained stable. CT concerning for uncomplicated diverticulitis. Patient was started on antibiotics in the emergency room. She is to be discharged home on a 7 day course of Bactrim and Flagyl, Zofran, Percocet, Tylenol. CT also concerning for constipation. Patient was instructed to take daily senna and Colace twice a day. Patient will be given PO abx and meds at this time. Will PO challenge.  [CV]  0940 Pain is well controlled with by mouth meds. Patient tolerating by mouth with no nausea or vomiting. Vital signs remained stable. Patient's condition be discharged home at this time.  [CV]    Clinical Course User Index [CV] Rudene Re, MD    Pertinent labs & imaging results that were available during my care of the patient were reviewed by me and considered in my medical  decision making (see chart for details).    ____________________________________________   FINAL CLINICAL IMPRESSION(S) / ED DIAGNOSES  Final diagnoses:  Acute diverticulitis  Constipation, unspecified constipation type      NEW MEDICATIONS STARTED DURING THIS VISIT:  New Prescriptions   DOCUSATE SODIUM (COLACE) 100 MG CAPSULE    Take 1 capsule (100 mg total) by mouth 2 (two) times daily.   METRONIDAZOLE (FLAGYL) 500 MG TABLET    Take 1 tablet (500 mg total) by mouth 3 (three) times daily.   ONDANSETRON (ZOFRAN ODT) 4 MG DISINTEGRATING TABLET    Take 1 tablet (4 mg total) by mouth every 8 (eight) hours as needed for nausea or vomiting.   OXYCODONE (ROXICODONE) 5 MG IMMEDIATE RELEASE TABLET    Take 1 tablet (5 mg total) by mouth every 8 (eight) hours as needed.   SENNA (SENOKOT) 8.6 MG TABS TABLET    Take 1 tablet (8.6 mg total) by mouth  daily.   SULFAMETHOXAZOLE-TRIMETHOPRIM (BACTRIM DS,SEPTRA DS) 800-160 MG TABLET    Take 1 tablet by mouth 2 (two) times daily.     Note:  This document was prepared using Dragon voice recognition software and may include unintentional dictation errors.    Rudene Re, MD 07/21/16 [phone]

## 2016-07-21 NOTE — ED Notes (Signed)
Patient transported to CT 

## 2016-07-21 NOTE — Discharge Instructions (Signed)
Constipation: Take colace twice a day everyday. Take senna once a day at bedtime. Take daily probiotics. Drink plenty of fluids and eat a diet rich in fiber. If you go more than 3 days without a bowel movement, take 1 cap full of Miralax in the morning and one in the evening up to 5 days.   Pain control: Take tylenol 1000mg  every 8 hours. Take 5mg  of oxycodone every 6 hours for breakthrough pain. If you need the oxycodone make sure to take one senokot as well to prevent constipation.  Do not drink alcohol, drive or participate in any other potentially dangerous activities while taking this medication as it may make you sleepy. Do not take this medication with any other sedating medications, either prescription or over-the-counter.

## 2016-09-19 ENCOUNTER — Inpatient Hospital Stay
Admission: EM | Admit: 2016-09-19 | Discharge: 2016-09-21 | DRG: 392 | Disposition: A | Discharge status: Home / Self Care | Payer: MEDICARE | Attending: Internal Medicine | Admitting: Internal Medicine

## 2016-09-19 ENCOUNTER — Emergency Department: Payer: MEDICARE

## 2016-09-19 DIAGNOSIS — Z885 Allergy status to narcotic agent status: Secondary | ICD-10-CM | POA: Diagnosis not present

## 2016-09-19 DIAGNOSIS — Z888 Allergy status to other drugs, medicaments and biological substances status: Secondary | ICD-10-CM

## 2016-09-19 DIAGNOSIS — G8929 Other chronic pain: Secondary | ICD-10-CM | POA: Diagnosis not present

## 2016-09-19 DIAGNOSIS — Z91041 Radiographic dye allergy status: Secondary | ICD-10-CM | POA: Diagnosis not present

## 2016-09-19 DIAGNOSIS — Z91048 Other nonmedicinal substance allergy status: Secondary | ICD-10-CM

## 2016-09-19 DIAGNOSIS — Z79899 Other long term (current) drug therapy: Secondary | ICD-10-CM | POA: Diagnosis not present

## 2016-09-19 DIAGNOSIS — M1990 Unspecified osteoarthritis, unspecified site: Secondary | ICD-10-CM | POA: Diagnosis present

## 2016-09-19 DIAGNOSIS — Z880 Allergy status to penicillin: Secondary | ICD-10-CM | POA: Diagnosis not present

## 2016-09-19 DIAGNOSIS — K5792 Diverticulitis of intestine, part unspecified, without perforation or abscess without bleeding: Secondary | ICD-10-CM | POA: Diagnosis present

## 2016-09-19 DIAGNOSIS — M5136 Other intervertebral disc degeneration, lumbar region: Secondary | ICD-10-CM | POA: Diagnosis not present

## 2016-09-19 DIAGNOSIS — Z91013 Allergy to seafood: Secondary | ICD-10-CM | POA: Diagnosis not present

## 2016-09-19 DIAGNOSIS — Z881 Allergy status to other antibiotic agents status: Secondary | ICD-10-CM | POA: Diagnosis not present

## 2016-09-19 DIAGNOSIS — M810 Age-related osteoporosis without current pathological fracture: Secondary | ICD-10-CM | POA: Diagnosis not present

## 2016-09-19 DIAGNOSIS — I10 Essential (primary) hypertension: Secondary | ICD-10-CM | POA: Diagnosis not present

## 2016-09-19 DIAGNOSIS — K5732 Diverticulitis of large intestine without perforation or abscess without bleeding: Principal | ICD-10-CM | POA: Diagnosis present

## 2016-09-19 DIAGNOSIS — R109 Unspecified abdominal pain: Secondary | ICD-10-CM | POA: Diagnosis present

## 2016-09-19 LAB — COMPREHENSIVE METABOLIC PANEL
ALBUMIN: 3.2 g/dL — AB (ref 3.5–5.0)
ALT: 9 U/L — ABNORMAL LOW (ref 14–54)
AST: 16 U/L (ref 15–41)
Alkaline Phosphatase: 43 U/L (ref 38–126)
Anion gap: 5 (ref 5–15)
BILIRUBIN TOTAL: 0.4 mg/dL (ref 0.3–1.2)
BUN: 9 mg/dL (ref 6–20)
CALCIUM: 9.1 mg/dL (ref 8.9–10.3)
CO2: 29 mmol/L (ref 22–32)
Chloride: 101 mmol/L (ref 101–111)
Creatinine, Ser: 0.75 mg/dL (ref 0.44–1.00)
GFR calc non Af Amer: >60 mL/min (ref >60)
GLUCOSE: 117 mg/dL — AB (ref 65–99)
POTASSIUM: 3.8 mmol/L (ref 3.5–5.1)
SODIUM: 135 mmol/L (ref 135–145)
TOTAL PROTEIN: 8.3 g/dL — AB (ref 6.5–8.1)

## 2016-09-19 LAB — URINALYSIS, COMPLETE (UACMP) WITH MICROSCOPIC
BILIRUBIN URINE: NEGATIVE
GLUCOSE, UA: NEGATIVE mg/dL
HGB URINE DIPSTICK: NEGATIVE
KETONES UR: NEGATIVE mg/dL
LEUKOCYTES UA: NEGATIVE
NITRITE: NEGATIVE
PROTEIN: NEGATIVE mg/dL
Specific Gravity, Urine: 1.008 (ref 1.005–1.030)
pH: 6 (ref 5.0–8.0)

## 2016-09-19 LAB — CBC
HEMATOCRIT: 31.1 % — AB (ref 35.0–47.0)
HEMOGLOBIN: 10.5 g/dL — AB (ref 12.0–16.0)
MCH: 27.2 pg (ref 26.0–34.0)
MCHC: 33.7 g/dL (ref 32.0–36.0)
MCV: 80.7 fL (ref 80.0–100.0)
Platelets: 153 10*3/uL (ref 150–440)
RBC: 3.85 MIL/uL (ref 3.80–5.20)
RDW: 19.9 % — ABNORMAL HIGH (ref 11.5–14.5)
WBC: 4.9 10*3/uL (ref 3.6–11.0)

## 2016-09-19 LAB — LIPASE, BLOOD: Lipase: 54 U/L — ABNORMAL HIGH (ref 11–51)

## 2016-09-19 MED ORDER — ONDANSETRON HCL 4 MG/2ML IJ SOLN
4.0000 mg | Freq: Once | INTRAMUSCULAR | Status: AC
  Administered 2016-09-19: 4 mg via INTRAVENOUS
  Filled 2016-09-19: qty 2

## 2016-09-19 MED ORDER — CIPROFLOXACIN IN D5W 400 MG/200ML IV SOLN
400.0000 mg | Freq: Two times a day (BID) | INTRAVENOUS | Status: DC
  Administered 2016-09-19 – 2016-09-21 (×4): 400 mg via INTRAVENOUS
  Filled 2016-09-19 (×5): qty 200

## 2016-09-19 MED ORDER — ACETAMINOPHEN 650 MG RE SUPP: 650.0000 mg | Freq: Four times a day (QID) | RECTAL | Status: DC | PRN

## 2016-09-19 MED ORDER — KETOROLAC TROMETHAMINE 15 MG/ML IJ SOLN
15.0000 mg | Freq: Four times a day (QID) | INTRAMUSCULAR | Status: DC | PRN
  Administered 2016-09-20 – 2016-09-21 (×3): 15 mg via INTRAVENOUS
  Filled 2016-09-19 (×3): qty 1

## 2016-09-19 MED ORDER — CARVEDILOL 25 MG PO TABS
25.0000 mg | ORAL_TABLET | Freq: Two times a day (BID) | ORAL | Status: DC
  Administered 2016-09-19 – 2016-09-21 (×4): 25 mg via ORAL
  Filled 2016-09-19 (×4): qty 1

## 2016-09-19 MED ORDER — SODIUM CHLORIDE 0.9 % IV SOLN
INTRAVENOUS | Status: DC
  Administered 2016-09-19: 20:00:00 via INTRAVENOUS

## 2016-09-19 MED ORDER — ONDANSETRON HCL 4 MG PO TABS
4.0000 mg | ORAL_TABLET | Freq: Four times a day (QID) | ORAL | Status: DC | PRN
  Filled 2016-09-19: qty 1

## 2016-09-19 MED ORDER — METRONIDAZOLE IN NACL 5-0.79 MG/ML-% IV SOLN
500.0000 mg | Freq: Once | INTRAVENOUS | Status: AC
  Administered 2016-09-19: 500 mg via INTRAVENOUS
  Filled 2016-09-19: qty 100

## 2016-09-19 MED ORDER — FENTANYL CITRATE (PF) 100 MCG/2ML IJ SOLN
50.0000 ug | Freq: Once | INTRAMUSCULAR | Status: AC
  Administered 2016-09-19: 50 ug via INTRAVENOUS
  Filled 2016-09-19: qty 2

## 2016-09-19 MED ORDER — OXYCODONE HCL 5 MG PO TABS
5.0000 mg | ORAL_TABLET | Freq: Four times a day (QID) | ORAL | Status: DC | PRN
  Filled 2016-09-19: qty 1

## 2016-09-19 MED ORDER — VITAMIN D 1000 UNITS PO TABS
1000.0000 [IU] | ORAL_TABLET | Freq: Every day | ORAL | Status: DC
Start: 2016-09-19 — End: 2016-09-21
  Administered 2016-09-20 – 2016-09-21 (×2): 1000 [IU] via ORAL
  Filled 2016-09-19 (×2): qty 1

## 2016-09-19 MED ORDER — SULFAMETHOXAZOLE-TRIMETHOPRIM 800-160 MG PO TABS
1.0000 | ORAL_TABLET | Freq: Once | ORAL | Status: AC
  Administered 2016-09-19: 1 via ORAL
  Filled 2016-09-19: qty 1

## 2016-09-19 MED ORDER — SULFAMETHOXAZOLE-TRIMETHOPRIM 800-160 MG PO TABS
1.0000 | ORAL_TABLET | ORAL | Status: DC
  Administered 2016-09-19 – 2016-09-21 (×2): 1 via ORAL
  Filled 2016-09-19 (×2): qty 1

## 2016-09-19 MED ORDER — PANTOPRAZOLE SODIUM 40 MG PO TBEC
40.0000 mg | DELAYED_RELEASE_TABLET | Freq: Every day | ORAL | Status: DC
  Administered 2016-09-20 – 2016-09-21 (×2): 40 mg via ORAL
  Filled 2016-09-19 (×2): qty 1

## 2016-09-19 MED ORDER — ACETAMINOPHEN 325 MG PO TABS
650.0000 mg | ORAL_TABLET | Freq: Four times a day (QID) | ORAL | Status: DC | PRN
  Administered 2016-09-19: 650 mg via ORAL
  Filled 2016-09-19: qty 2

## 2016-09-19 MED ORDER — SENNA 8.6 MG PO TABS
1.0000 | ORAL_TABLET | Freq: Every day | ORAL | Status: DC
  Administered 2016-09-20 – 2016-09-21 (×2): 8.6 mg via ORAL
  Filled 2016-09-19 (×2): qty 1

## 2016-09-19 MED ORDER — HYDRALAZINE HCL 25 MG PO TABS
25.0000 mg | ORAL_TABLET | Freq: Two times a day (BID) | ORAL | Status: DC
  Administered 2016-09-19 – 2016-09-21 (×3): 25 mg via ORAL
  Filled 2016-09-19 (×4): qty 1

## 2016-09-19 MED ORDER — LISINOPRIL 10 MG PO TABS
20.0000 mg | ORAL_TABLET | Freq: Every day | ORAL | Status: DC
  Administered 2016-09-20 – 2016-09-21 (×2): 20 mg via ORAL
  Filled 2016-09-19 (×2): qty 2

## 2016-09-19 MED ORDER — METRONIDAZOLE IN NACL 5-0.79 MG/ML-% IV SOLN
500.0000 mg | Freq: Three times a day (TID) | INTRAVENOUS | Status: DC
  Administered 2016-09-19 – 2016-09-21 (×5): 500 mg via INTRAVENOUS
  Filled 2016-09-19 (×7): qty 100

## 2016-09-19 MED ORDER — PSYLLIUM 95 % PO PACK
1.0000 | PACK | Freq: Two times a day (BID) | ORAL | Status: DC
  Administered 2016-09-19: 1 via ORAL
  Filled 2016-09-19 (×5): qty 1

## 2016-09-19 MED ORDER — SPIRONOLACTONE 25 MG PO TABS: 25.0000 mg | ORAL_TABLET | Freq: Every day | ORAL | Status: DC | PRN

## 2016-09-19 MED ORDER — ONDANSETRON 4 MG PO TBDP
4.0000 mg | ORAL_TABLET | Freq: Three times a day (TID) | ORAL | Status: DC | PRN
  Administered 2016-09-19 – 2016-09-21 (×5): 4 mg via ORAL
  Filled 2016-09-19 (×5): qty 1

## 2016-09-19 NOTE — Progress Notes (Signed)
Ch. was stopped by Pt. and husband who had no Ed room but was waiting on room after admission. Ch. gave emotional support who asked about Ch. Fred. Ch. Was advised.

## 2016-09-19 NOTE — ED Notes (Signed)
Hospitalist to bedside at this time 

## 2016-09-19 NOTE — ED Notes (Signed)
Patient transported to CT 

## 2016-09-19 NOTE — Progress Notes (Signed)
Pharmacy Antibiotic Note  Tamkia Temples is a 83 y.o. female admitted on 09/19/2016 with Diverticulitis.  Pharmacy has been consulted for Cipro dosing.  Avelox allergy=swelling of tongue,rash. Per RN discussion with patient this happens with Levaquin too.  Per EMAR patient received Cipro 06/18/16-06/21/16. Discussed with admitting MD. Will change Levaquin to Cipro. RN aware to monitor patient.  Plan: Cipro 400mg  IV Q12h  (patient also on Metronidazole) and Septra MWF. Patient with "flare ups" every few months    Height: 5\' 4"  (162.6 cm) Weight: 136 lb (61.7 kg) IBW/kg (Calculated) : 54.7  Temp (24hrs), Avg:98.3 F (36.8 C), Min:98.2 F (36.8 C), Max:98.4 F (36.9 C)   Recent Labs Lab 09/19/16 1103  WBC 4.9  CREATININE 0.75    Estimated Creatinine Clearance: 46 mL/min (by C-G formula based on SCr of 0.75 mg/dL).    Allergies  Allergen Reactions  . Clindamycin/Lincomycin Anaphylaxis  . Ivp Dye [Iodinated Diagnostic Agents] Anaphylaxis  . Penicillins Anaphylaxis and Rash    .Has patient had a PCN reaction causing immediate rash, facial/tongue/throat swelling, SOB or lightheadedness with hypotension: Yes Has patient had a PCN reaction causing severe rash involving mucus membranes or skin necrosis: No Has patient had a PCN reaction that required hospitalization: Unknown Has patient had a PCN reaction occurring within the last 10 years: Yes If all of the above answers are "NO", then may proceed with Cephalosporin use.   . Amlodipine   . Avelox [Moxifloxacin Hcl In Nacl]   . Codeine   . Erythromycin   . Monosodium Glutamate   . Shellfish Allergy   . Zostavax [Zoster Vaccine Live]     Antimicrobials this admission: Metronidazole 6/18 >>   cipro 6/18 >>   Septra MWF (PTA)  Dose adjustments this admission:    Microbiology results:   BCx:     UCx:      Sputum:      MRSA PCR:    Thank you for allowing pharmacy to be a part of this patient's care.  Desmund Elman  A 09/19/2016 8:44 PM

## 2016-09-19 NOTE — ED Triage Notes (Addendum)
Pt arrives to ER via POV c/o LLQ recurrent abdominal pain X1 week. Pt states she has flare ups of diverticulitis every few months. Denies NVD. LLQ pain under umbiliculs only.

## 2016-09-19 NOTE — H&P (Signed)
[facility] NAME: Janet Duffy    MR#:  [id number]  DATE OF BIRTH:  June 17, 1931  DATE OF ADMISSION:  09/19/2016  PRIMARY CARE PHYSICIAN: Don Broach, MD   REQUESTING/REFERRING PHYSICIAN: McShane  CHIEF COMPLAINT:   Abdominal pain HISTORY OF PRESENT ILLNESS:  Janet Duffy  is a 83 y.o. female with a known history of Recurrent acute diverticulitis, hypertension and other medical problems is presenting to the ED with a chief complaint of left lower quadrant abdominal pain associated with nausea denies any vomiting. She has 4 episodes of acute aortic latest during this year, denies any hematemesis. No other complaints  PAST MEDICAL HISTORY:   Past Medical History:  Diagnosis Date  . Connective tissue disease ([place])   . DDD (degenerative disc disease), lumbar   . Diverticulitis   . Granuloma annulare   . HTN (hypertension)   . Osteoporosis     PAST SURGICAL HISTOIRY:  History reviewed. No pertinent surgical history.  SOCIAL HISTORY:   Social History  Substance Use Topics  . Smoking status: Never Smoker  . Smokeless tobacco: Never Used  . Alcohol use No    FAMILY HISTORY:  No family history on file.  DRUG ALLERGIES:   Allergies  Allergen Reactions  . Clindamycin/Lincomycin Anaphylaxis  . Ivp Dye [Iodinated Diagnostic Agents] Anaphylaxis  . Penicillins Anaphylaxis and Rash    .Has patient had a PCN reaction causing immediate rash, facial/tongue/throat swelling, SOB or lightheadedness with hypotension: Yes Has patient had a PCN reaction causing severe rash involving mucus membranes or skin necrosis: No Has patient had a PCN reaction that required hospitalization: Unknown Has patient had a PCN reaction occurring within the last 10 years: Yes If all of the above answers are "NO", then may proceed with Cephalosporin use.   . Amlodipine   . Avelox [Moxifloxacin Hcl In Nacl]   . Codeine   . Erythromycin    . Monosodium Glutamate   . Shellfish Allergy   . Zostavax [Zoster Vaccine Live]     REVIEW OF SYSTEMS:  CONSTITUTIONAL: No fever, fatigue or weakness.  EYES: No blurred or double vision.  EARS, NOSE, AND THROAT: No tinnitus or ear pain.  RESPIRATORY: No cough, shortness of breath, wheezing or hemoptysis.  CARDIOVASCULAR: No chest pain, orthopnea, edema.  GASTROINTESTINAL: pt has nausea, denies  vomiting, denies diarrhea ; pt has LLQ abdominal pain.  GENITOURINARY: No dysuria, hematuria.  ENDOCRINE: No polyuria, nocturia,  HEMATOLOGY: No anemia, easy bruising or bleeding SKIN: No rash or lesion. MUSCULOSKELETAL: No joint pain or arthritis.   NEUROLOGIC: No tingling, numbness, weakness.  PSYCHIATRY: No anxiety or depression.   MEDICATIONS AT HOME:   Prior to Admission medications   Medication Sig Start Date End Date Taking? Authorizing Provider  carvedilol (COREG) 25 MG tablet Take 25 mg by mouth 2 (two) times daily. 05/18/16  Yes [provider]  Cholecalciferol (VITAMIN D3) 1000 units CAPS Take 1,000 Units by mouth daily.   Yes [provider]  hydrALAZINE (APRESOLINE) 50 MG tablet Take 25 mg by mouth 2 (two) times daily.  05/24/16 05/24/17 Yes [provider]  lisinopril (PRINIVIL,ZESTRIL) 20 MG tablet Take 20 mg by mouth daily. 04/22/16 04/22/17 Yes [provider]  nitroGLYCERIN (NITROGLYN) 2 % ointment Apply 1 inch topically 2 (two) times daily as needed. Use on affected fingers twice daily as needed for Raynauds 02/20/14  Yes [provider]  omeprazole (PRILOSEC) 20 MG capsule Take 20  mg by mouth daily. 12/04/15  Yes [provider]  ondansetron (ZOFRAN ODT) 4 MG disintegrating tablet Take 1 tablet (4 mg total) by mouth every 8 (eight) hours as needed for nausea or vomiting. 07/21/16  Yes [place], [place], MD  psyllium (REGULOID) 0.52 g capsule Take 0.52 g by mouth 2 (two) times daily.   Yes [provider]  senna  (SENOKOT) 8.6 MG TABS tablet Take 1 tablet (8.6 mg total) by mouth daily. 07/21/16  Yes Alfred Levins, [place], MD  spironolactone (ALDACTONE) 25 MG tablet Take 25 mg by mouth daily. ' 06/03/16 06/03/17 Yes [provider]  sulfamethoxazole-trimethoprim (BACTRIM DS,SEPTRA DS) 800-160 MG tablet Take 1 tablet by mouth 3 (three) times a week. 08/18/16  Yes [provider]  ondansetron (ZOFRAN) 4 MG tablet Take 1 tablet (4 mg total) by mouth every 6 (six) hours as needed for nausea. Patient not taking: Reported on 09/19/2016 06/21/16   Max Sane, MD  oxyCODONE (ROXICODONE) 5 MG immediate release tablet Take 1 tablet (5 mg total) by mouth every 8 (eight) hours as needed. Patient not taking: Reported on 09/19/2016 07/21/16 07/21/17  Rudene Re, MD      VITAL SIGNS:  Blood pressure (!) 185/85, pulse 63, temperature 98.4 F (36.9 C), temperature source Oral, resp. rate 16, height 5\' 4"  (1.626 m), weight 61.7 kg (136 lb), SpO2 98 %.  PHYSICAL EXAMINATION:  GENERAL:  83 y.o.-year-old patient lying in the bed with no acute distress.  EYES: Pupils equal, round, reactive to light and accommodation. No scleral icterus. Extraocular muscles intact.  HEENT: Head atraumatic, normocephalic. Oropharynx and nasopharynx clear.  NECK:  Supple, no jugular venous distention. No thyroid enlargement, no tenderness.  LUNGS: Normal breath sounds bilaterally, no wheezing, rales,rhonchi or crepitation. No use of accessory muscles of respiration.  CARDIOVASCULAR: S1, S2 normal. No murmurs, rubs, or gallops.  ABDOMEN: Soft, Left lower quadrant is tender no rebound tenderness nondistended. Bowel sounds present. No organomegaly or mass.  EXTREMITIES: No pedal edema, cyanosis, or clubbing.  NEUROLOGIC: Cranial nerves II through XII are intact. Muscle strength 5/5 in all extremities. Sensation intact. Gait not checked.  PSYCHIATRIC: The patient is alert and oriented x 3.  SKIN: No obvious rash, lesion, or ulcer.    LABORATORY PANEL:   CBC  Recent Labs Lab 09/19/16 1103  WBC 4.9  HGB 10.5*  HCT 31.1*  PLT 153   ------------------------------------------------------------------------------------------------------------------  Chemistries   Recent Labs Lab 09/19/16 1103  NA 135  K 3.8  CL 101  CO2 29  GLUCOSE 117*  BUN 9  CREATININE 0.75  CALCIUM 9.1  AST 16  ALT 9*  ALKPHOS 43  BILITOT 0.4   ------------------------------------------------------------------------------------------------------------------  Cardiac Enzymes No results for input(s): TROPONINI in the last 168 hours. ------------------------------------------------------------------------------------------------------------------  RADIOLOGY:  Ct Abdomen Pelvis Wo Contrast  Result Date: 09/19/2016 CLINICAL DATA:  Abdominal pain.  Pain for 1 week. EXAM: CT ABDOMEN AND PELVIS WITHOUT CONTRAST TECHNIQUE: Multidetector CT imaging of the abdomen and pelvis was performed following the standard protocol without IV contrast. COMPARISON:  None. FINDINGS: Lower chest: No acute abnormality. Hepatobiliary: No focal liver abnormality is seen. No gallstones, gallbladder wall thickening, or biliary dilatation. Pancreas: Unremarkable. No pancreatic ductal dilatation or surrounding inflammatory changes. Spleen: Normal in size without focal abnormality. Adrenals/Urinary Tract: Adrenal glands are unremarkable. Kidneys are normal, without renal calculi, focal lesion, or hydronephrosis. Bladder is unremarkable. Stomach/Bowel: Stomach is within normal limits. Small hiatal hernia. Sigmoid diverticulosis with mild bowel wall thickening and hazy inflammatory  changes concerning for mild diverticulitis. No extraluminal contrast. No evidence of other bowel wall thickening, distention, or inflammatory changes. Vascular/Lymphatic: No significant vascular findings are present. No enlarged abdominal or pelvic lymph nodes. Reproductive: Status post  hysterectomy. No adnexal masses. Other: No fluid collection or hematoma. Musculoskeletal: No acute osseous abnormality. No lytic or sclerotic osseous lesion. Chronic L2 vertebral body compression fracture status post remote augmentation. IMPRESSION: 1. Mild acute sigmoid diverticulitis. No focal fluid collection to suggest an abscess. Electronically Signed   By: Kathreen Devoid   On: 09/19/2016 14:27    EKG:   Orders placed or performed during the hospital encounter of 06/18/16  . EKG 12-Lead  . EKG 12-Lead  . EKG    IMPRESSION AND PLAN:   Janet Duffy  is a 83 y.o. female with a known history of Recurrent acute diverticulitis, hypertension and other medical problems is presenting to the ED with a chief complaint of left lower quadrant abdominal pain associated with nausea denies any vomiting.    #Acute sigmoid diverticulitis-recurrent Admit to MedSurg unit IV levofloxacin and Flagyl Clear liquids and pain management as needed If no improvement consider GI consult and surgical consult  #Acute left lower quadrant abdominal pain secondary to acute diverticulitis Pain management as needed and supportive treatment  #Hypertension Continue home medication hydralazine, Coreg and ACE inhibitor  #Degenerative joint disease chronic Pain management as needed. Tylenol for mild pain, oxycodone for moderate pain,  DVT prophylaxis with SCDs patient is allergic to Lovenox and heparin  All the records are reviewed and case discussed with ED provider. Management plans discussed with the patient, family and they are in agreement.  CODE STATUS: fcHusband healthcare power of attorney  TOTAL TIME TAKING CARE OF THIS PATIENT: 45 minutes.   Note: This dictation was prepared with Dragon dictation along with smaller phrase technology. Any transcriptional errors that result from this process are unintentional.  Nicholes Mango M.D on 09/19/2016 at 4:31 PM  Between 7am to 6pm - Pager -  [phone]  After 6pm go to [email or web address] - password [email or web address] [facility]  [facility] [facility]  Office  [phone]  CC: Primary care physician; Don Broach, MD

## 2016-09-19 NOTE — ED Provider Notes (Addendum)
[facility] Emergency Department Provider Note  ____________________________________________   I have reviewed the triage vital signs and the nursing notes.   HISTORY  Chief Complaint Abdominal Pain    HPI Janet Duffy is a 83 y.o. female Who presents today complaining of abdominal pain. Patient states that she has a history of recurrent diverticulitis. She also states that she has multiple medical intolerances. She states she's had pain for about one week her lower abdomen more on the left than the right. Similar prior diverticulitis. Denies fever vomiting or change in stooling. Nothing makes it better, nothing makes it worse. She would like some pain medication but "not enough to have any side effects" as she does not like the feeling of narcotics but she states she would like to have narcotics at this time. She did not have a history ofanaphylaxis to pain medication.   Past Medical History:  Diagnosis Date  . Connective tissue disease ([place])   . DDD (degenerative disc disease), lumbar   . Diverticulitis   . Granuloma annulare   . HTN (hypertension)   . Osteoporosis     Patient Active Problem List   Diagnosis Date Noted  . Acute diverticulitis 06/18/2016    History reviewed. No pertinent surgical history.  Prior to Admission medications   Medication Sig Start Date End Date Taking? Authorizing Provider  carvedilol (COREG) 25 MG tablet Take 25 mg by mouth 2 (two) times daily. 05/18/16   [provider]  Cholecalciferol (VITAMIN D3) 1000 units CAPS Take 1,000 Units by mouth daily.    [provider]  ciprofloxacin (CIPRO) 500 MG tablet Take 1 tablet (500 mg total) by mouth 2 (two) times daily. 06/21/16   Max Sane, MD  hydrALAZINE (APRESOLINE) 50 MG tablet Take 50 mg by mouth 3 (three) times daily. 05/24/16 05/24/17  [provider]  lisinopril (PRINIVIL,ZESTRIL) 20 MG tablet Take 20 mg by mouth daily. 04/22/16 04/22/17  [provider]  nitroGLYCERIN (NITROSTAT) 0.4 MG SL tablet Place 0.4 mg under the tongue every 5 (five) minutes as needed for chest pain.    [provider]  omeprazole (PRILOSEC) 20 MG capsule Take 20 mg by mouth daily. 12/04/15   [provider]  ondansetron (ZOFRAN ODT) 4 MG disintegrating tablet Take 1 tablet (4 mg total) by mouth every 8 (eight) hours as needed for nausea or vomiting. 07/21/16   Rudene Re, MD  ondansetron (ZOFRAN) 4 MG tablet Take 1 tablet (4 mg total) by mouth every 6 (six) hours as needed for nausea. 06/21/16   Max Sane, MD  oxyCODONE (ROXICODONE) 5 MG immediate release tablet Take 1 tablet (5 mg total) by mouth every 8 (eight) hours as needed. 07/21/16 07/21/17  Rudene Re, MD  psyllium (REGULOID) 0.52 g capsule Take 0.52 g by mouth 2 (two) times daily.    [provider]  senna (SENOKOT) 8.6 MG TABS tablet Take 1 tablet (8.6 mg total) by mouth daily. 07/21/16   Rudene Re, MD  spironolactone (ALDACTONE) 25 MG tablet Take 25 mg by mouth daily. ' 06/03/16 06/03/17  [provider]    Allergies Amlodipine; Avelox [moxifloxacin hcl in nacl]; Clindamycin/lincomycin; Codeine; Erythromycin; Ivp dye [iodinated diagnostic agents]; Monosodium glutamate; Penicillins; Shellfish allergy; and Zostavax [zoster vaccine live]  No family history on file.  Social History Social History  Substance Use Topics  . Smoking status: Never Smoker  . Smokeless tobacco: Never Used  . Alcohol use No    Review of Systems Constitutional:  No fever/chills Eyes: No visual changes. ENT: No sore throat. No stiff neck no neck pain Cardiovascular: Denies chest pain. Respiratory: Denies shortness of breath. Gastrointestinal:   no vomiting.  No diarrhea.  No constipation. Genitourinary: Negative for dysuria. Musculoskeletal: Negative lower extremity swelling Skin: Negative for rash. Neurological: Negative for severe headaches, focal weakness or  numbness.   ____________________________________________   PHYSICAL EXAM:  VITAL SIGNS: ED Triage Vitals  Enc Vitals Group     BP 09/19/16 1102 (!) 164/78     Pulse Rate 09/19/16 1102 76     Resp 09/19/16 1102 18     Temp 09/19/16 1102 98.4 F (36.9 C)     Temp Source 09/19/16 1102 Oral     SpO2 09/19/16 1102 97 %     Weight 09/19/16 1103 136 lb (61.7 kg)     Height 09/19/16 1103 5\' 4"  (1.626 m)     Head Circumference --      Peak Flow --      Pain Score 09/19/16 1102 5     Pain Loc --      Pain Edu? --      Excl. in [place]? --     Constitutional: Alert and oriented. Well appearing and in no acute distress. Eyes: Conjunctivae are normal Head: Atraumatic HEENT: No congestion/rhinnorhea. Mucous membranes are moist.  Oropharynx non-erythematous Neck:   Nontender with no meningismus, no masses, no stridor Cardiovascular: Normal rate, regular rhythm. Grossly normal heart sounds.  Good peripheral circulation. Respiratory: Normal respiratory effort.  No retractions. Lungs CTAB. Abdominal: Soft and diffusely tender in the lower abdomen. No distention. Voluntary guarding no rebound Back:  There is no focal tenderness or step off.  there is no midline tenderness there are no lesions noted. there is no CVA tenderness Musculoskeletal: No lower extremity tenderness, no upper extremity tenderness. No joint effusions, no DVT signs strong distal pulses no edema Neurologic:  Normal speech and language. No gross focal neurologic deficits are appreciated.  Skin:  Skin is warm, dry and intact. No rash noted. Psychiatric: Mood and affect are normal. Speech and behavior are normal.  ____________________________________________   LABS (all labs ordered are listed, but only abnormal results are displayed)  Labs Reviewed  LIPASE, BLOOD - Abnormal; Notable for the following:       Result Value   Lipase 54 (*)    All other components within normal limits  COMPREHENSIVE METABOLIC PANEL -  Abnormal; Notable for the following:    Glucose, Bld 117 (*)    Total Protein 8.3 (*)    Albumin 3.2 (*)    ALT 9 (*)    All other components within normal limits  CBC - Abnormal; Notable for the following:    Hemoglobin 10.5 (*)    HCT 31.1 (*)    RDW 19.9 (*)    All other components within normal limits  URINALYSIS, COMPLETE (UACMP) WITH MICROSCOPIC - Abnormal; Notable for the following:    Color, Urine YELLOW (*)    APPearance CLEAR (*)    Bacteria, UA RARE (*)    Squamous Epithelial / LPF 0-5 (*)    All other components within normal limits   ____________________________________________  EKG  I personally interpreted any EKGs ordered by me or triage  ____________________________________________  RADIOLOGY  I reviewed any imaging ordered by me or triage that were performed during my shift and, if possible, patient and/or family made aware of any abnormal findings. ____________________________________________   PROCEDURES  Procedure(s) performed: None  Procedures  Critical Care performed: None  ____________________________________________   INITIAL IMPRESSION / ASSESSMENT AND PLAN / ED COURSE  Pertinent labs & imaging results that were available during my care of the patient were reviewed by me and considered in my medical decision making (see chart for details).  Patient here with lower abdominal pain consistent with prior diverticular disease. Vital signs and blood work are reassuring urinalysis is reassuring, we will give her fentanyl as this is the least likely causative feel woozy and we will giveantiemetics as well as she sometimes vomits with pain medication she states. Given the constraints of her intolerances, complete pain relief may be difficult to obtain but we will certainly try to make her comfortable. Obtaining CT scan without contrast given allergies and we will reassess  ----------------------------------------- 3:13 PM on  09/19/2016 -----------------------------------------  Patient with him, complicated diverticulitis however has ongoing pain which is requiring several doses of IV pain medication here and I think the patient would benefit from admission. She did well with Bactrim and Flagyl last time given her allergies to Avelox and penicillin which we will try this time. Discussed with the hospitalist and they agree.    ____________________________________________   FINAL CLINICAL IMPRESSION(S) / ED DIAGNOSES  Final diagnoses:  None      This chart was dictated using voice recognition software.  Despite best efforts to proofread,  errors can occur which can change meaning.      Schuyler Amor, MD 09/19/16 1401    Schuyler Amor, MD 09/19/16 [phone]

## 2016-09-20 DIAGNOSIS — K5732 Diverticulitis of large intestine without perforation or abscess without bleeding: Secondary | ICD-10-CM | POA: Diagnosis not present

## 2016-09-20 DIAGNOSIS — R109 Unspecified abdominal pain: Secondary | ICD-10-CM | POA: Diagnosis not present

## 2016-09-20 LAB — COMPREHENSIVE METABOLIC PANEL
ALBUMIN: 2.7 g/dL — AB (ref 3.5–5.0)
ALT: 9 U/L — ABNORMAL LOW (ref 14–54)
ANION GAP: 7 (ref 5–15)
AST: 14 U/L — ABNORMAL LOW (ref 15–41)
Alkaline Phosphatase: 35 U/L — ABNORMAL LOW (ref 38–126)
BUN: 8 mg/dL (ref 6–20)
CO2: 25 mmol/L (ref 22–32)
Calcium: 8.6 mg/dL — ABNORMAL LOW (ref 8.9–10.3)
Chloride: 106 mmol/L (ref 101–111)
Creatinine, Ser: 0.83 mg/dL (ref 0.44–1.00)
GFR calc non Af Amer: >60 mL/min (ref >60)
GLUCOSE: 105 mg/dL — AB (ref 65–99)
POTASSIUM: 3.4 mmol/L — AB (ref 3.5–5.1)
SODIUM: 138 mmol/L (ref 135–145)
TOTAL PROTEIN: 7.5 g/dL (ref 6.5–8.1)
Total Bilirubin: 0.7 mg/dL (ref 0.3–1.2)

## 2016-09-20 LAB — CBC
HEMATOCRIT: 29.3 % — AB (ref 35.0–47.0)
HEMOGLOBIN: 9.7 g/dL — AB (ref 12.0–16.0)
MCH: 26.8 pg (ref 26.0–34.0)
MCHC: 33.2 g/dL (ref 32.0–36.0)
MCV: 80.7 fL (ref 80.0–100.0)
Platelets: 145 10*3/uL — ABNORMAL LOW (ref 150–440)
RBC: 3.63 MIL/uL — ABNORMAL LOW (ref 3.80–5.20)
RDW: 19.8 % — ABNORMAL HIGH (ref 11.5–14.5)
WBC: 4.2 10*3/uL (ref 3.6–11.0)

## 2016-09-20 MED ORDER — POTASSIUM CHLORIDE CRYS ER 20 MEQ PO TBCR
20.0000 meq | EXTENDED_RELEASE_TABLET | Freq: Once | ORAL | Status: AC
  Administered 2016-09-20: 20 meq via ORAL
  Filled 2016-09-20: qty 1

## 2016-09-20 NOTE — Progress Notes (Signed)
Patient asked for the chaplain to visit with her. [place] met with pt, but pt was about to have her lunch. [place] spoke briefly with pt and told pt he would return to see her after lunch this afternoon. [place] to make a follow up visit with the pt as needed.

## 2016-09-20 NOTE — Progress Notes (Signed)
[place] at [facility]                                                                                                                                                                                  Patient Demographics   Janet Duffy, is a 83 y.o. female, DOB - 1931-12-04, YQM:[id number]  Admit date - 09/19/2016   Admitting Physician Nicholes Mango, MD  Outpatient Primary MD for the patient is Don Broach, MD   LOS - 1  Subjective: Patient admitted with recurrent diverticulitis status abdominal pain improved    Review of Systems:   CONSTITUTIONAL: No documented fever. No fatigue, weakness. No weight gain, no weight loss.  EYES: No blurry or double vision.  ENT: No tinnitus. No postnasal drip. No redness of the oropharynx.  RESPIRATORY: No cough, no wheeze, no hemoptysis. No dyspnea.  CARDIOVASCULAR: No chest pain. No orthopnea. No palpitations. No syncope.  GASTROINTESTINAL: No nausea, no vomiting or diarrhea.Improved abdominal pain. No melena or hematochezia.  GENITOURINARY: No dysuria or hematuria.  ENDOCRINE: No polyuria or nocturia. No heat or cold intolerance.  HEMATOLOGY: No anemia. No bruising. No bleeding.  INTEGUMENTARY: No rashes. No lesions.  MUSCULOSKELETAL: No arthritis. No swelling. No gout.  NEUROLOGIC: No numbness, tingling, or ataxia. No seizure-type activity.  PSYCHIATRIC: No anxiety. No insomnia. No ADD.    Vitals:   Vitals:   09/19/16 2105 09/20/16 0518 09/20/16 1156 09/20/16 1232  BP: (!) 173/73 (!) 112/49 (!) 148/58 (!) 129/57  Pulse: 71 65 66 75  Resp: 20 (!) 22 18 20   Temp: 98.1 F (36.7 C) 97.7 F (36.5 C) 98.7 F (37.1 C) 97.9 F (36.6 C)  TempSrc: Oral Oral Oral Oral  SpO2: 94% 95% 97% 97%  Weight:      Height:        Wt Readings from Last 3 Encounters:  09/19/16 136 lb (61.7 kg)  07/21/16 135 lb (61.2 kg)  06/18/16 135 lb 1.6 oz (61.3 kg)     Intake/Output Summary (Last 24 hours) at 09/20/16  1237 Last data filed at 09/20/16 0932  Gross per 24 hour  Intake             1340 ml  Output              600 ml  Net              740 ml    Physical Exam:   GENERAL: Pleasant-appearing in no apparent distress.  HEAD, EYES, EARS, NOSE AND THROAT: Atraumatic, normocephalic. Extraocular muscles are intact. Pupils equal and reactive to light. Sclerae anicteric. No conjunctival injection. No oro-pharyngeal erythema.  NECK: Supple.  There is no jugular venous distention. No bruits, no lymphadenopathy, no thyromegaly.  HEART: Regular rate and rhythm,. No murmurs, no rubs, no clicks.  LUNGS: Clear to auscultation bilaterally. No rales or rhonchi. No wheezes.  ABDOMEN: Soft, flat, mild left lower quadrant tenderness, nondistended. Has good bowel sounds. No hepatosplenomegaly appreciated.  EXTREMITIES: No evidence of any cyanosis, clubbing, or peripheral edema.  +2 pedal and radial pulses bilaterally.  NEUROLOGIC: The patient is alert, awake, and oriented x3 with no focal motor or sensory deficits appreciated bilaterally.  SKIN: Moist and warm with no rashes appreciated.  Psych: Not anxious, depressed LN: No inguinal LN enlargement    Antibiotics   Anti-infectives    Start     Dose/Rate Route Frequency Ordered Stop   09/19/16 2100  sulfamethoxazole-trimethoprim (BACTRIM DS,SEPTRA DS) 800-160 MG per tablet 1 tablet     1 tablet Oral Once per day on Mon Wed Fri 09/19/16 1808     09/19/16 2000  metroNIDAZOLE (FLAGYL) IVPB 500 mg     500 mg 100 mL/hr over 60 Minutes Intravenous Every 8 hours 09/19/16 1808     09/19/16 2000  ciprofloxacin (CIPRO) IVPB 400 mg     400 mg 200 mL/hr over 60 Minutes Intravenous Every 12 hours 09/19/16 1838     09/19/16 1445  sulfamethoxazole-trimethoprim (BACTRIM DS,SEPTRA DS) 800-160 MG per tablet 1 tablet     1 tablet Oral  Once 09/19/16 1438 09/19/16 1529   09/19/16 1445  metroNIDAZOLE (FLAGYL) IVPB 500 mg     500 mg 100 mL/hr over 60 Minutes Intravenous  Once  09/19/16 1438 09/19/16 1814      Medications   Scheduled Meds: . carvedilol  25 mg Oral BID  . cholecalciferol  1,000 Units Oral Daily  . hydrALAZINE  25 mg Oral BID  . lisinopril  20 mg Oral Daily  . pantoprazole  40 mg Oral Daily  . psyllium  1 packet Oral BID  . senna  1 tablet Oral Daily  . sulfamethoxazole-trimethoprim  1 tablet Oral Once per day on Mon Wed Fri   Continuous Infusions: . ciprofloxacin Stopped (09/20/16 1150)  . metronidazole 500 mg (09/20/16 1150)   PRN Meds:.acetaminophen **OR** acetaminophen, ketorolac, ondansetron, ondansetron, oxyCODONE, spironolactone   Data Review:   Micro Results No results found for this or any previous visit (from the past 240 hour(s)).  Radiology Reports Ct Abdomen Pelvis Wo Contrast  Result Date: 09/19/2016 CLINICAL DATA:  Abdominal pain.  Pain for 1 week. EXAM: CT ABDOMEN AND PELVIS WITHOUT CONTRAST TECHNIQUE: Multidetector CT imaging of the abdomen and pelvis was performed following the standard protocol without IV contrast. COMPARISON:  None. FINDINGS: Lower chest: No acute abnormality. Hepatobiliary: No focal liver abnormality is seen. No gallstones, gallbladder wall thickening, or biliary dilatation. Pancreas: Unremarkable. No pancreatic ductal dilatation or surrounding inflammatory changes. Spleen: Normal in size without focal abnormality. Adrenals/Urinary Tract: Adrenal glands are unremarkable. Kidneys are normal, without renal calculi, focal lesion, or hydronephrosis. Bladder is unremarkable. Stomach/Bowel: Stomach is within normal limits. Small hiatal hernia. Sigmoid diverticulosis with mild bowel wall thickening and hazy inflammatory changes concerning for mild diverticulitis. No extraluminal contrast. No evidence of other bowel wall thickening, distention, or inflammatory changes. Vascular/Lymphatic: No significant vascular findings are present. No enlarged abdominal or pelvic lymph nodes. Reproductive: Status post  hysterectomy. No adnexal masses. Other: No fluid collection or hematoma. Musculoskeletal: No acute osseous abnormality. No lytic or sclerotic osseous lesion. Chronic L2 vertebral body compression fracture status post remote augmentation. IMPRESSION:  1. Mild acute sigmoid diverticulitis. No focal fluid collection to suggest an abscess. Electronically Signed   By: Kathreen Devoid   On: 09/19/2016 14:27     CBC  Recent Labs Lab 09/19/16 1103 09/20/16 0444  WBC 4.9 4.2  HGB 10.5* 9.7*  HCT 31.1* 29.3*  PLT 153 145*  MCV 80.7 80.7  MCH 27.2 26.8  MCHC 33.7 33.2  RDW 19.9* 19.8*    Chemistries   Recent Labs Lab 09/19/16 1103 09/20/16 0444  NA 135 138  K 3.8 3.4*  CL 101 106  CO2 29 25  GLUCOSE 117* 105*  BUN 9 8  CREATININE 0.75 0.83  CALCIUM 9.1 8.6*  AST 16 14*  ALT 9* 9*  ALKPHOS 43 35*  BILITOT 0.4 0.7   ------------------------------------------------------------------------------------------------------------------ estimated creatinine clearance is 44.3 mL/min (by C-G formula based on SCr of 0.83 mg/dL). ------------------------------------------------------------------------------------------------------------------ No results for input(s): HGBA1C in the last 72 hours. ------------------------------------------------------------------------------------------------------------------ No results for input(s): CHOL, HDL, LDLCALC, TRIG, CHOLHDL, LDLDIRECT in the last 72 hours. ------------------------------------------------------------------------------------------------------------------ No results for input(s): TSH, T4TOTAL, T3FREE, THYROIDAB in the last 72 hours.  Invalid input(s): FREET3 ------------------------------------------------------------------------------------------------------------------ No results for input(s): VITAMINB12, FOLATE, FERRITIN, TIBC, IRON, RETICCTPCT in the last 72 hours.  Coagulation profile No results for input(s): INR, PROTIME in the  last 168 hours.  No results for input(s): DDIMER in the last 72 hours.  Cardiac Enzymes No results for input(s): CKMB, TROPONINI, MYOGLOBIN in the last 168 hours.  Invalid input(s): CK ------------------------------------------------------------------------------------------------------------------ Invalid input(s): [place] is presenting to the ED with a chief complaint of left lower quadrant abdominal pain associated with nausea denies any vomiting.    #Acute sigmoid diverticulitis-recurrent Continue IV antibiotics Advance diet to full liquids  #Acute left lower quadrant abdominal pain secondary to acute diverticulitis Pain management as needed and supportive treatment improve  #Hypertension blood pressure stable Continue home medication hydralazine, Coreg and ACE inhibitor  #Degenerative joint disease chronic Pain management as needed. Tylenol for mild pain, oxycodone for moderate pain,  DVT prophylaxis with SCDs ,patient is allergic to Lovenox and heparin      Code Status Orders        Start     Ordered   09/19/16 1809  Full code  Continuous     09/19/16 1808    Code Status History    Date Active Date Inactive Code Status Order ID Comments User Context   06/18/2016 10:36 PM 06/21/2016  4:52 PM Full Code [id number]  Hugelmeyer, Ubaldo Glassing, DO Inpatient    Advance Directive Documentation     Most Recent Value  Type of Advance Directive  Living will, Healthcare Power of Attorney  Pre-existing out of facility DNR order (yellow form or pink MOST form)  -  "MOST" Form in Place?  -           ConsultsNone  Component Value Date   PLT 145 (L) 09/20/2016     Time Spent in minutes   80min  Greater than 50% of time spent in care coordination and counseling patient regarding the condition and plan of care.   Dustin Flock M.D on 09/20/2016 at 12:37 PM  Between 7am to 6pm - Pager - [phone]  After 6pm go to  [email or web address] - password [email or web address] [facility]   Office  [phone]

## 2016-09-20 NOTE — Progress Notes (Signed)
CH made a follow up visit with the pt at 1:52 pm, pt's long time friend had just stepped into the Rm, but the pt appeared to sleepy. [place] asked the pt if it was okay to visit her another time, pt said yes. [place] excused himself promising to visit pt another time.   09/20/16 1400  Clinical Encounter Type  Visited With Patient;Other (Comment)  Visit Type Follow-up;Spiritual support  Referral From [place];Other (Comment)

## 2016-09-21 MED ORDER — ACETAMINOPHEN 325 MG PO TABS
650.0000 mg | ORAL_TABLET | Freq: Four times a day (QID) | ORAL | Status: AC | PRN
Start: 2016-09-21 — End: ?

## 2016-09-21 MED ORDER — METRONIDAZOLE 500 MG PO TABS: 500.0000 mg | ORAL_TABLET | Freq: Three times a day (TID) | ORAL | 0 refills | Status: AC

## 2016-09-21 MED ORDER — TRAMADOL HCL 50 MG PO TABS: 50.0000 mg | ORAL_TABLET | Freq: Four times a day (QID) | ORAL | 0 refills | Status: DC | PRN

## 2016-09-21 MED ORDER — CIPROFLOXACIN HCL 500 MG PO TABS: 500.0000 mg | ORAL_TABLET | Freq: Two times a day (BID) | ORAL | Status: DC

## 2016-09-21 MED ORDER — ONDANSETRON HCL 4 MG PO TABS: 4.0000 mg | ORAL_TABLET | Freq: Four times a day (QID) | ORAL | 0 refills | Status: DC | PRN

## 2016-09-21 MED ORDER — TRAMADOL HCL 50 MG PO TABS: 50.0000 mg | ORAL_TABLET | Freq: Four times a day (QID) | ORAL | Status: DC | PRN

## 2016-09-21 MED ORDER — METRONIDAZOLE 500 MG PO TABS: 500.0000 mg | ORAL_TABLET | Freq: Three times a day (TID) | ORAL | Status: DC

## 2016-09-21 MED ORDER — CIPROFLOXACIN HCL 500 MG PO TABS: 500.0000 mg | ORAL_TABLET | Freq: Two times a day (BID) | ORAL | 0 refills | Status: AC

## 2016-09-21 NOTE — Discharge Summary (Signed)
Janet Duffy at [facility], 83 y.o., DOB 1931-06-26, MRN [id number]. Admission date: 09/19/2016 Discharge Date 09/21/2016 Primary MD Don Broach, MD Admitting Physician Nicholes Mango, MD  Admission Diagnosis  Diverticulitis [K57.92]  Discharge Diagnosis   Active Problems:   Acute diverticulitis  Essential hypertension  Degenerative joint disease Connective tissue disease Osteoporosis       Hospital Course Janet Duffy  is a 83 y.o. female with a known history of Recurrent acute diverticulitis, hypertension and other medical problems is presenting to the ED with a chief complaint of left lower quadrant abdominal pain associated with nausea denies any vomiting. Patient is on prophylactic Bactrim 3 times a week. Patient had a CT scan which showed diverticulitis. She was treated with IV antibiotics with improvement in her symptoms. She is tolerating her diet. And stable for discharge.            Consults  None  Significant Tests:  See full reports for all details     Ct Abdomen Pelvis Wo Contrast  Result Date: 09/19/2016 CLINICAL DATA:  Abdominal pain.  Pain for 1 week. EXAM: CT ABDOMEN AND PELVIS WITHOUT CONTRAST TECHNIQUE: Multidetector CT imaging of the abdomen and pelvis was performed following the standard protocol without IV contrast. COMPARISON:  None. FINDINGS: Lower chest: No acute abnormality. Hepatobiliary: No focal liver abnormality is seen. No gallstones, gallbladder wall thickening, or biliary dilatation. Pancreas: Unremarkable. No pancreatic ductal dilatation or surrounding inflammatory changes. Spleen: Normal in size without focal abnormality. Adrenals/Urinary Tract: Adrenal glands are unremarkable. Kidneys are normal, without renal calculi, focal lesion, or hydronephrosis. Bladder is unremarkable. Stomach/Bowel: Stomach is within normal limits. Small hiatal hernia. Sigmoid diverticulosis with mild bowel wall thickening  and hazy inflammatory changes concerning for mild diverticulitis. No extraluminal contrast. No evidence of other bowel wall thickening, distention, or inflammatory changes. Vascular/Lymphatic: No significant vascular findings are present. No enlarged abdominal or pelvic lymph nodes. Reproductive: Status post hysterectomy. No adnexal masses. Other: No fluid collection or hematoma. Musculoskeletal: No acute osseous abnormality. No lytic or sclerotic osseous lesion. Chronic L2 vertebral body compression fracture status post remote augmentation. IMPRESSION: 1. Mild acute sigmoid diverticulitis. No focal fluid collection to suggest an abscess. Electronically Signed   By: Kathreen Devoid   On: 09/19/2016 14:27       Today   Subjective:   [name]  feels much better abdominal pain mostly resolved  Objective:   Blood pressure (!) 147/60, pulse 65, temperature 98.2 F (36.8 C), temperature source Oral, resp. rate 16, height 5\' 4"  (1.626 m), weight 136 lb (61.7 kg), SpO2 96 %.  .  Intake/Output Summary (Last 24 hours) at 09/21/16 1542 Last data filed at 09/21/16 0926  Gross per 24 hour  Intake              780 ml  Output              950 ml  Net             -170 ml    Exam VITAL SIGNS: Blood pressure (!) 147/60, pulse 65, temperature 98.2 F (36.8 C), temperature source Oral, resp. rate 16, height 5\' 4"  (1.626 m), weight 136 lb (61.7 kg), SpO2 96 %.  GENERAL:  83 y.o.-year-old patient lying in the bed with no acute distress.  EYES: Pupils equal, round, reactive to light and accommodation. No scleral icterus. Extraocular muscles intact.  HEENT: Head atraumatic, normocephalic. Oropharynx and nasopharynx clear.  NECK:  Supple, no jugular venous distention. No thyroid enlargement, no tenderness.  LUNGS: Normal breath sounds bilaterally, no wheezing, rales,rhonchi or crepitation. No use of accessory muscles of respiration.  CARDIOVASCULAR: S1, S2 normal. No murmurs, rubs, or gallops.  ABDOMEN:  Soft, nontender, nondistended. Bowel sounds present. No organomegaly or mass.  EXTREMITIES: No pedal edema, cyanosis, or clubbing.  NEUROLOGIC: Cranial nerves II through XII are intact. Muscle strength 5/5 in all extremities. Sensation intact. Gait not checked.  PSYCHIATRIC: The patient is alert and oriented x 3.  SKIN: No obvious rash, lesion, or ulcer.   Data Review     CBC w Diff: Lab Results  Component Value Date   WBC 4.2 09/20/2016   HGB 9.7 (L) 09/20/2016   HCT 29.3 (L) 09/20/2016   PLT 145 (L) 09/20/2016   LYMPHOPCT 19 07/21/2016   MONOPCT 12 07/21/2016   EOSPCT 1 07/21/2016   BASOPCT 1 07/21/2016   CMP: Lab Results  Component Value Date   NA 138 09/20/2016   K 3.4 (L) 09/20/2016   CL 106 09/20/2016   CO2 25 09/20/2016   BUN 8 09/20/2016   CREATININE 0.83 09/20/2016   PROT 7.5 09/20/2016   ALBUMIN 2.7 (L) 09/20/2016   BILITOT 0.7 09/20/2016   ALKPHOS 35 (L) 09/20/2016   AST 14 (L) 09/20/2016   ALT 9 (L) 09/20/2016  .  Micro Results No results found for this or any previous visit (from the past 240 hour(s)).      Code Status Orders        Start     Ordered   09/19/16 1809  Full code  Continuous     09/19/16 1808    Code Status History    Date Active Date Inactive Code Status Order ID Comments User Context   06/18/2016 10:36 PM 06/21/2016  4:52 PM Full Code [id number]  Hugelmeyer, Ubaldo Glassing, DO Inpatient    Advance Directive Documentation     Most Recent Value  Type of Advance Directive  Living will, Healthcare Power of Attorney  Pre-existing out of facility DNR order (yellow form or pink MOST form)  -  "MOST" Form in Place?  -          Follow-up Information    Don Broach, MD In 1 week.   Specialty:  Internal Medicine Contact information: [place] [address] [phone]        Toy Cookey, NP. Go on 09/23/2016.   Why:  @11 :30a Contact information: [place], [place]  [id number] [phone]          Discharge Medications   Allergies as of 09/21/2016      Reactions   Clindamycin/lincomycin Anaphylaxis   Ivp Dye [iodinated Diagnostic Agents] Anaphylaxis   Penicillins Anaphylaxis, Rash   .Has patient had a PCN reaction causing immediate rash, facial/tongue/throat swelling, SOB or lightheadedness with hypotension: Yes Has patient had a PCN reaction causing severe rash involving mucus membranes or skin necrosis: No Has patient had a PCN reaction that required hospitalization: Unknown Has patient had a PCN reaction occurring within the last 10 years: Yes If all of the above answers are "NO", then may proceed with Cephalosporin use.   Amlodipine    Avelox [moxifloxacin Hcl In Nacl]    Codeine    Erythromycin    Monosodium Glutamate    Shellfish Allergy    Zostavax [zoster Vaccine Live]       Medication List    TAKE these  medications   acetaminophen 325 MG tablet Commonly known as:  TYLENOL Take 2 tablets (650 mg total) by mouth every 6 (six) hours as needed for mild pain (or Fever >/= 101).   carvedilol 25 MG tablet Commonly known as:  COREG Take 25 mg by mouth 2 (two) times daily.   ciprofloxacin 500 MG tablet Commonly known as:  CIPRO Take 1 tablet (500 mg total) by mouth 2 (two) times daily.   hydrALAZINE 50 MG tablet Commonly known as:  APRESOLINE Take 25 mg by mouth 2 (two) times daily.   lisinopril 20 MG tablet Commonly known as:  PRINIVIL,ZESTRIL Take 20 mg by mouth daily.   metroNIDAZOLE 500 MG tablet Commonly known as:  FLAGYL Take 1 tablet (500 mg total) by mouth every 8 (eight) hours.   nitroGLYCERIN 2 % ointment Commonly known as:  NITROGLYN Apply 1 inch topically 2 (two) times daily as needed. Use on affected fingers twice daily as needed for Raynauds   omeprazole 20 MG capsule Commonly known as:  PRILOSEC Take 20 mg by mouth daily.   ondansetron 4 MG disintegrating tablet Commonly known as:  ZOFRAN ODT Take  1 tablet (4 mg total) by mouth every 8 (eight) hours as needed for nausea or vomiting.   ondansetron 4 MG tablet Commonly known as:  ZOFRAN Take 1 tablet (4 mg total) by mouth every 6 (six) hours as needed for nausea. Notes to patient:  Last dose given today at 7:20am   oxyCODONE 5 MG immediate release tablet Commonly known as:  ROXICODONE Take 1 tablet (5 mg total) by mouth every 8 (eight) hours as needed.   psyllium 0.52 g capsule Commonly known as:  REGULOID Take 0.52 g by mouth 2 (two) times daily.   senna 8.6 MG Tabs tablet Commonly known as:  SENOKOT Take 1 tablet (8.6 mg total) by mouth daily.   spironolactone 25 MG tablet Commonly known as:  ALDACTONE Take 25 mg by mouth daily. '   sulfamethoxazole-trimethoprim 800-160 MG tablet Commonly known as:  BACTRIM DS,SEPTRA DS Take 1 tablet by mouth 3 (three) times a week.   traMADol 50 MG tablet Commonly known as:  ULTRAM Take 1 tablet (50 mg total) by mouth every 6 (six) hours as needed for moderate pain.   Vitamin D3 1000 units Caps Take 1,000 Units by mouth daily.          Total Time in preparing paper work, data evaluation and todays exam - 35 minutes  Dustin Flock M.D on 09/21/2016 at 3:42 PM  [facility]   Office  [phone]

## 2016-09-21 NOTE — Progress Notes (Signed)
MD ordered patient to be discharged home.  Discharge instructions were reviewed with the patient and she voiced understanding.  Follow-up appointment was made.  Prescriptions given to the patient and were sent to her pharmacy.  IV was removed with catheter intact.  All patients questions were answered.  Patient left via wheelchair escorted by auxillary.

## 2017-03-16 ENCOUNTER — Emergency Department: Payer: Medicare HMO

## 2017-03-16 ENCOUNTER — Other Ambulatory Visit: Payer: Self-pay

## 2017-03-16 ENCOUNTER — Emergency Department
Admission: EM | Admit: 2017-03-16 | Discharge: 2017-03-17 | Disposition: A | Discharge status: Home / Self Care | Payer: Medicare HMO | Attending: Emergency Medicine | Admitting: Emergency Medicine

## 2017-03-16 DIAGNOSIS — I10 Essential (primary) hypertension: Secondary | ICD-10-CM | POA: Insufficient documentation

## 2017-03-16 DIAGNOSIS — Z79899 Other long term (current) drug therapy: Secondary | ICD-10-CM | POA: Insufficient documentation

## 2017-03-16 LAB — CBC
HCT: 32.7 % — ABNORMAL LOW (ref 35.0–47.0)
Hemoglobin: 10.8 g/dL — ABNORMAL LOW (ref 12.0–16.0)
MCH: 27.2 pg (ref 26.0–34.0)
MCHC: 33.1 g/dL (ref 32.0–36.0)
MCV: 82.2 fL (ref 80.0–100.0)
Platelets: 198 10*3/uL (ref 150–440)
RBC: 3.97 MIL/uL (ref 3.80–5.20)
RDW: 17.6 % — ABNORMAL HIGH (ref 11.5–14.5)
WBC: 4.8 10*3/uL (ref 3.6–11.0)

## 2017-03-16 MED ORDER — LABETALOL HCL 5 MG/ML IV SOLN
10.0000 mg | Freq: Once | INTRAVENOUS | Status: AC
  Administered 2017-03-16: 10 mg via INTRAVENOUS

## 2017-03-16 MED ORDER — LABETALOL HCL 5 MG/ML IV SOLN
INTRAVENOUS | Status: AC
  Administered 2017-03-16: 10 mg via INTRAVENOUS
  Filled 2017-03-16: qty 4

## 2017-03-16 NOTE — ED Triage Notes (Addendum)
Pt arrives to ED via POV from home with c/o HYPERtension. Pt reports HA from elevated BP, which is normal per pt. Pt reports home BP of 221/101; reports compliance with prescribed BP medications.

## 2017-03-17 ENCOUNTER — Emergency Department: Payer: Medicare HMO

## 2017-03-17 LAB — BASIC METABOLIC PANEL
ANION GAP: 9 (ref 5–15)
BUN: 19 mg/dL (ref 6–20)
CHLORIDE: 102 mmol/L (ref 101–111)
CO2: 26 mmol/L (ref 22–32)
Calcium: 9.3 mg/dL (ref 8.9–10.3)
Creatinine, Ser: 0.84 mg/dL (ref 0.44–1.00)
GFR calc Af Amer: >60 mL/min (ref >60)
GLUCOSE: 112 mg/dL — AB (ref 65–99)
POTASSIUM: 3.8 mmol/L (ref 3.5–5.1)
Sodium: 137 mmol/L (ref 135–145)

## 2017-03-17 LAB — TROPONIN I: Troponin I: <0.03 ng/mL (ref <0.03)

## 2017-03-17 MED ORDER — HYDRALAZINE HCL 50 MG PO TABS
ORAL_TABLET | ORAL | Status: AC
  Administered 2017-03-17: 50 mg via ORAL
  Filled 2017-03-17: qty 1

## 2017-03-17 MED ORDER — GADOBENATE DIMEGLUMINE 529 MG/ML IV SOLN
15.0000 mL | Freq: Once | INTRAVENOUS | Status: AC | PRN
  Administered 2017-03-17: 12 mL via INTRAVENOUS

## 2017-03-17 MED ORDER — HYDRALAZINE HCL 50 MG PO TABS
50.0000 mg | ORAL_TABLET | Freq: Once | ORAL | Status: AC
  Administered 2017-03-17: 50 mg via ORAL

## 2017-03-17 NOTE — ED Notes (Signed)
Transported to MRI by this EDT at 00:54 returned by this EDT 01:53.

## 2017-03-17 NOTE — ED Provider Notes (Signed)
[facility] Emergency Department Provider Note    First MD Initiated Contact with Patient 03/16/17 2306     (approximate)  I have reviewed the triage vital signs and the nursing notes.   HISTORY  Chief Complaint Hypertension    HPI Janet Duffy is a 83 y.o. female with below list of chronic medical conditions including hypertension presents to the emergency department with hypertension noted at home with a systolic blood pressure exceeding 200.  Patient states she had posterior neck and head pain which prompted her to check her blood pressure which was noted to be elevated.  Patient denies any other symptoms no chest pain shortness of breath lower externally pain or swelling.  Patient denies any weakness no numbness gait instability nausea or vomiting.  Patient does admit to a family history her father died "in his 47s" secondary to cerebral aneurysm  Past Medical History:  Diagnosis Date  . Connective tissue disease ([place])   . DDD (degenerative disc disease), lumbar   . Diverticulitis   . Granuloma annulare   . HTN (hypertension)   . Osteoporosis     Patient Active Problem List   Diagnosis Date Noted  . Acute diverticulitis 06/18/2016    History reviewed. No pertinent surgical history.  Prior to Admission medications   Medication Sig Start Date End Date Taking? Authorizing Provider  acetaminophen (TYLENOL) 325 MG tablet Take 2 tablets (650 mg total) by mouth every 6 (six) hours as needed for mild pain (or Fever >/= 101). 09/21/16   Dustin Flock, MD  carvedilol (COREG) 25 MG tablet Take 25 mg by mouth 2 (two) times daily. 05/18/16   [provider]  Cholecalciferol (VITAMIN D3) 1000 units CAPS Take 1,000 Units by mouth daily.    [provider]  hydrALAZINE (APRESOLINE) 50 MG tablet Take 25 mg by mouth 2 (two) times daily.  05/24/16 05/24/17  [provider]  lisinopril (PRINIVIL,ZESTRIL) 20 MG tablet Take 20 mg by mouth  daily. 04/22/16 04/22/17  [provider]  nitroGLYCERIN (NITROGLYN) 2 % ointment Apply 1 inch topically 2 (two) times daily as needed. Use on affected fingers twice daily as needed for Raynauds 02/20/14   [provider]  omeprazole (PRILOSEC) 20 MG capsule Take 20 mg by mouth daily. 12/04/15   [provider]  ondansetron (ZOFRAN ODT) 4 MG disintegrating tablet Take 1 tablet (4 mg total) by mouth every 8 (eight) hours as needed for nausea or vomiting. 07/21/16   Rudene Re, MD  ondansetron (ZOFRAN) 4 MG tablet Take 1 tablet (4 mg total) by mouth every 6 (six) hours as needed for nausea. 09/21/16   Dustin Flock, MD  oxyCODONE (ROXICODONE) 5 MG immediate release tablet Take 1 tablet (5 mg total) by mouth every 8 (eight) hours as needed. Patient not taking: Reported on 09/19/2016 07/21/16 07/21/17  Rudene Re, MD  psyllium (REGULOID) 0.52 g capsule Take 0.52 g by mouth 2 (two) times daily.    [provider]  senna (SENOKOT) 8.6 MG TABS tablet Take 1 tablet (8.6 mg total) by mouth daily. 07/21/16   Rudene Re, MD  spironolactone (ALDACTONE) 25 MG tablet Take 25 mg by mouth daily. ' 06/03/16 06/03/17  [provider]  sulfamethoxazole-trimethoprim (BACTRIM DS,SEPTRA DS) 800-160 MG tablet Take 1 tablet by mouth 3 (three) times a week. 08/18/16   [provider]  traMADol (ULTRAM) 50 MG tablet Take 1 tablet (50 mg total) by mouth every 6 (six) hours as needed for moderate  pain. 09/21/16   Dustin Flock, MD    Allergies Clindamycin/lincomycin; Ivp dye [iodinated diagnostic agents]; Penicillins; Amlodipine; Avelox [moxifloxacin hcl in nacl]; Codeine; Erythromycin; Monosodium glutamate; Shellfish allergy; and Zostavax [zoster vaccine live]  No family history on file.  Social History Social History   Tobacco Use  . Smoking status: Never Smoker  . Smokeless tobacco: Never Used  Substance Use Topics  . Alcohol use: No  . Drug use: No     Review of Systems Constitutional: No fever/chills Eyes: No visual changes. ENT: No sore throat. Cardiovascular: Denies chest pain. Respiratory: Denies shortness of breath. Gastrointestinal: No abdominal pain.  No nausea, no vomiting.  No diarrhea.  No constipation. Genitourinary: Negative for dysuria. Musculoskeletal: Negative for neck pain.  Negative for back pain. Integumentary: Negative for rash. Neurological: Positive for headaches, negative for focal weakness or numbness.   ____________________________________________   PHYSICAL EXAM:  VITAL SIGNS: ED Triage Vitals  Enc Vitals Group     BP 03/16/17 2217 (!) 216/84     Pulse Rate 03/16/17 2217 78     Resp 03/16/17 2217 18     Temp 03/16/17 2217 97.7 F (36.5 C)     Temp Source 03/16/17 2217 Oral     SpO2 03/16/17 2217 99 %     Weight 03/16/17 2216 60.3 kg (133 lb)     Height 03/16/17 2216 1.626 m (5\' 4" )     Head Circumference --      Peak Flow --      Pain Score 03/16/17 2216 6     Pain Loc --      Pain Edu? --      Excl. in [place]? --     Constitutional: Alert and oriented. Well appearing and in no acute distress. Eyes: Conjunctivae are normal.  Pupils equal round reactive to light and accommodation. Mouth/Throat: Mucous membranes are moist.  Oropharynx non-erythematous. Neck: No stridor.  Cardiovascular: Normal rate, regular rhythm. Good peripheral circulation. Grossly normal heart sounds. Respiratory: Normal respiratory effort.  No retractions. Lungs CTAB. Gastrointestinal: Soft and nontender. No distention.  Musculoskeletal: No lower extremity tenderness nor edema. No gross deformities of extremities. Neurologic:  Normal speech and language. No gross focal neurologic deficits are appreciated.  Skin:  Skin is warm, dry and intact. No rash noted. Psychiatric: Mood and affect are normal. Speech and behavior are normal.  ____________________________________________   LABS (all labs ordered are listed, but  only abnormal results are displayed)  Labs Reviewed  BASIC METABOLIC PANEL - Abnormal; Notable for the following components:      Result Value   Glucose, Bld 112 (*)    All other components within normal limits  CBC - Abnormal; Notable for the following components:   Hemoglobin 10.8 (*)    HCT 32.7 (*)    RDW 17.6 (*)    All other components within normal limits  TROPONIN I   ____________________________________________  EKG  ED ECG REPORT I, [place] Kasiya Burck, the attending physician, personally viewed and interpreted this ECG.   Date: 03/17/2017  EKG Time: 11:52 PM  Rate: 75  Rhythm: Normal sinus rhythm with premature ventricular contraction  Axis: Normal  Intervals: Normal  ST&T Change: None  ____________________________________________  RADIOLOGY I, [place] Ernst Bowler, personally viewed and evaluated these images (plain radiographs) as part of my medical decision making, as well as reviewing the written report by the radiologist.  Mr Jodene Nam Head Wo Contrast  Result Date: 03/17/2017 CLINICAL DATA:  With medial and blood right  medial EXAM: MRA NECK WITHOUT AND WITH CONTRAST MRA HEAD WITHOUT CONTRAST TECHNIQUE: Multiplanar and multiecho pulse sequences of the neck were obtained without and with intravenous contrast. Angiographic images of the neck were obtained using MRA technique without and with intravenous contast.; Angiographic images of the Circle of Willis were obtained using MRA technique without intravenous contrast. CONTRAST:  12 cc of MultiHance. COMPARISON:  None available. FINDINGS: Source images reviewed. Visualized aortic arch of normal caliber with normal 3 vessel morphology. No flow-limiting stenosis about the origin of the great vessels. Right common and internal carotid artery is patent without stenosis or occlusion. No significant atheromatous narrowing about the right carotid bifurcation. Left common and internal carotid artery is patent without stenosis or  occlusion. No significant atheromatous narrowing about the left carotid bifurcation. Both of the vertebral arteries arise from the subclavian arteries. Vertebral arteries patent within the neck without stenosis or occlusion. Mild diffuse vessel tortuosity noted, suggesting chronic underlying hypertension. MRA HEAD FINDINGS ANTERIOR CIRCULATION: Distal cervical segments of the internal carotid arteries are widely patent with antegrade flow. Petrous, cavernous, and supraclinoid segments patent bilaterally without flow-limiting stenosis. Mild atherosclerotic change within knee carotid siphons bilaterally. A1 segments patent bilaterally. Anterior communicating artery normal. Anterior cerebral arteries patent to their distal aspects without flow-limiting stenosis. M1 segments patent without stenosis. Normal MCA bifurcations. No proximal M2 occlusion. Distal MCA branches well perfused and symmetric. POSTERIOR CIRCULATION: Vertebral artery is patent to the vertebrobasilar junction without flow-limiting stenosis. Partially visualized posterior inferior cerebral arteries patent bilaterally. Basilar artery patent to its distal aspect without flow-limiting stenosis. Superior cerebral arteries patent bilaterally. Left PCA supplied via the basilar. Fetal type origin of the right PCA supplied via a AE right posterior communicating artery. PCAs patent to their distal aspects. Apparent attenuation of the right PCA is compared left likely related to fetal origin. No aneurysm. IMPRESSION: 1. Negative MRA of the head and neck. No large vessel occlusion. No high-grade or correctable stenosis. 2. Minor carotid siphon atherosclerotic change for patient age. 3. Mild diffuse vessel tortuosity, suggesting chronic underlying hypertension. Electronically Signed   By: Jeannine Boga M.D.   On: 03/17/2017 03:24   Mr Angiogram Neck W Or Wo Contrast  Result Date: 03/17/2017 CLINICAL DATA:  With medial and blood right medial EXAM: MRA  NECK WITHOUT AND WITH CONTRAST MRA HEAD WITHOUT CONTRAST TECHNIQUE: Multiplanar and multiecho pulse sequences of the neck were obtained without and with intravenous contrast. Angiographic images of the neck were obtained using MRA technique without and with intravenous contast.; Angiographic images of the Circle of Willis were obtained using MRA technique without intravenous contrast. CONTRAST:  12 cc of MultiHance. COMPARISON:  None available. FINDINGS: Source images reviewed. Visualized aortic arch of normal caliber with normal 3 vessel morphology. No flow-limiting stenosis about the origin of the great vessels. Right common and internal carotid artery is patent without stenosis or occlusion. No significant atheromatous narrowing about the right carotid bifurcation. Left common and internal carotid artery is patent without stenosis or occlusion. No significant atheromatous narrowing about the left carotid bifurcation. Both of the vertebral arteries arise from the subclavian arteries. Vertebral arteries patent within the neck without stenosis or occlusion. Mild diffuse vessel tortuosity noted, suggesting chronic underlying hypertension. MRA HEAD FINDINGS ANTERIOR CIRCULATION: Distal cervical segments of the internal carotid arteries are widely patent with antegrade flow. Petrous, cavernous, and supraclinoid segments patent bilaterally without flow-limiting stenosis. Mild atherosclerotic change within knee carotid siphons bilaterally. A1 segments patent bilaterally. Anterior communicating artery normal.  Anterior cerebral arteries patent to their distal aspects without flow-limiting stenosis. M1 segments patent without stenosis. Normal MCA bifurcations. No proximal M2 occlusion. Distal MCA branches well perfused and symmetric. POSTERIOR CIRCULATION: Vertebral artery is patent to the vertebrobasilar junction without flow-limiting stenosis. Partially visualized posterior inferior cerebral arteries patent bilaterally.  Basilar artery patent to its distal aspect without flow-limiting stenosis. Superior cerebral arteries patent bilaterally. Left PCA supplied via the basilar. Fetal type origin of the right PCA supplied via a AE right posterior communicating artery. PCAs patent to their distal aspects. Apparent attenuation of the right PCA is compared left likely related to fetal origin. No aneurysm. IMPRESSION: 1. Negative MRA of the head and neck. No large vessel occlusion. No high-grade or correctable stenosis. 2. Minor carotid siphon atherosclerotic change for patient age. 3. Mild diffuse vessel tortuosity, suggesting chronic underlying hypertension. Electronically Signed   By: Jeannine Boga M.D.   On: 03/17/2017 03:24   Dg Chest Port 1 View  Result Date: 03/17/2017 CLINICAL DATA:  Hypertension and headache. EXAM: PORTABLE CHEST 1 VIEW COMPARISON:  None. FINDINGS: A single AP portable view of the chest demonstrates no focal airspace consolidation or alveolar edema. The lungs are grossly clear. There is no large effusion or pneumothorax. Cardiac and mediastinal contours appear unremarkable. IMPRESSION: No active disease. Electronically Signed   By: Andreas Newport M.D.   On: 03/17/2017 01:20     Procedures   ____________________________________________   INITIAL IMPRESSION / ASSESSMENT AND PLAN / ED COURSE  As part of my medical decision making, I reviewed the following data within the electronic MEDICAL RECORD [id number] year old female presenting with above-stated history and physical exam hypertension with posterior neck and head pain.  Concern for possible posterior circulation pathology and as such with MRA of the brain was performed which was negative.  Patient received labetalol 10 mg with improvement of blood pressure with 30% decrease in systolic blood pressure achieved.  Patient then given hydralazine 50 mg which she is prescribed for at home.  Patient informed me that she has not been taking  hydralazine 3 times daily as recommended "because my blood pressure is doing well".  Advised the patient to resume taking hydralazine and all other medications as prescribed.  Patient is also advised to follow-up with her primary care provider today ____________________________________________  FINAL CLINICAL IMPRESSION(S) / ED DIAGNOSES  Final diagnoses:  Essential hypertension     MEDICATIONS GIVEN DURING THIS VISIT:  Medications  labetalol (NORMODYNE,TRANDATE) injection 10 mg (10 mg Intravenous Given 03/16/17 2349)  gadobenate dimeglumine (MULTIHANCE) injection 15 mL (12 mLs Intravenous Contrast Given 03/17/17 0139)  hydrALAZINE (APRESOLINE) tablet 50 mg (50 mg Oral Given 03/17/17 0341)     ED Discharge Orders    None       Note:  This document was prepared using Dragon voice recognition software and may include unintentional dictation errors.    Gregor Hams, MD 03/17/17 [phone]

## 2017-03-17 NOTE — ED Notes (Signed)
Patient transported to MRI 

## 2017-10-08 ENCOUNTER — Emergency Department
Admission: EM | Admit: 2017-10-08 | Discharge: 2017-10-09 | Disposition: A | Discharge status: Home / Self Care | Payer: Medicare HMO | Attending: Emergency Medicine | Admitting: Emergency Medicine

## 2017-10-08 ENCOUNTER — Other Ambulatory Visit: Payer: Self-pay

## 2017-10-08 DIAGNOSIS — I10 Essential (primary) hypertension: Secondary | ICD-10-CM | POA: Insufficient documentation

## 2017-10-08 DIAGNOSIS — Z79899 Other long term (current) drug therapy: Secondary | ICD-10-CM | POA: Diagnosis not present

## 2017-10-08 MED ORDER — LORAZEPAM 1 MG PO TABS
1.0000 mg | ORAL_TABLET | Freq: Once | ORAL | Status: AC
  Administered 2017-10-09: 1 mg via ORAL
  Filled 2017-10-08: qty 1

## 2017-10-08 MED ORDER — HYDRALAZINE HCL 50 MG PO TABS
50.0000 mg | ORAL_TABLET | Freq: Once | ORAL | Status: AC
  Administered 2017-10-09: 50 mg via ORAL
  Filled 2017-10-08: qty 1

## 2017-10-08 NOTE — ED Triage Notes (Signed)
Patient states she took her blood pressure tonight before bed and it was 207/104. Took an extra Carvedilol at 2145. Patient denies CP/shob/[place] or vomiting.

## 2017-10-08 NOTE — ED Notes (Signed)
Of note, when asked if patient had symptoms causing her to check her blood pressure she said she just checks it from time to time and this time it was elevated.

## 2017-10-08 NOTE — ED Provider Notes (Signed)
[facility] Emergency Department Provider Note  ____________________________________________   First MD Initiated Contact with Patient 10/08/17 2325     (approximate)  I have reviewed the triage vital signs and the nursing notes.   HISTORY  Chief Complaint Hypertension   HPI Janet Duffy is a 84 y.o. female who comes to the emergency department with asymptomatic hypertension.  She checked her blood pressure before bed tonight and it was 207/104 which concerned her and brought her to the emergency department.  She has a long-standing history of hypertension that has been challenging to control.  She currently takes carvedilol as well as hydralazine.  Prior to coming to the emergency department she took an extra dose of carvedilol tonight.  She is been under tremendous amount of stress recently as 1 of her close family friends died in our hospital last night.  She denies chest pain shortness of breath abdominal pain nausea vomiting headache double vision blurred vision.    Past Medical History:  Diagnosis Date  . Connective tissue disease ([place])   . DDD (degenerative disc disease), lumbar   . Diverticulitis   . Granuloma annulare   . HTN (hypertension)   . Osteoporosis     Patient Active Problem List   Diagnosis Date Noted  . Acute diverticulitis 06/18/2016    History reviewed. No pertinent surgical history.  Prior to Admission medications   Medication Sig Start Date End Date Taking? Authorizing Provider  acetaminophen (TYLENOL) 325 MG tablet Take 2 tablets (650 mg total) by mouth every 6 (six) hours as needed for mild pain (or Fever >/= 101). 09/21/16   Dustin Flock, MD  carvedilol (COREG) 25 MG tablet Take 25 mg by mouth 2 (two) times daily. 05/18/16   [provider]  Cholecalciferol (VITAMIN D3) 1000 units CAPS Take 1,000 Units by mouth daily.    [provider]  hydrALAZINE (APRESOLINE) 50 MG tablet Take 25 mg by mouth 2 (two)  times daily.  05/24/16 05/24/17  [provider]  lisinopril (PRINIVIL,ZESTRIL) 20 MG tablet Take 20 mg by mouth daily. 04/22/16 04/22/17  [provider]  nitroGLYCERIN (NITROGLYN) 2 % ointment Apply 1 inch topically 2 (two) times daily as needed. Use on affected fingers twice daily as needed for Raynauds 02/20/14   [provider]  omeprazole (PRILOSEC) 20 MG capsule Take 20 mg by mouth daily. 12/04/15   [provider]  ondansetron (ZOFRAN ODT) 4 MG disintegrating tablet Take 1 tablet (4 mg total) by mouth every 8 (eight) hours as needed for nausea or vomiting. 07/21/16   Rudene Re, MD  ondansetron (ZOFRAN) 4 MG tablet Take 1 tablet (4 mg total) by mouth every 6 (six) hours as needed for nausea. 09/21/16   Dustin Flock, MD  psyllium (REGULOID) 0.52 g capsule Take 0.52 g by mouth 2 (two) times daily.    [provider]  senna (SENOKOT) 8.6 MG TABS tablet Take 1 tablet (8.6 mg total) by mouth daily. 07/21/16   Rudene Re, MD  spironolactone (ALDACTONE) 25 MG tablet Take 25 mg by mouth daily. ' 06/03/16 06/03/17  [provider]  sulfamethoxazole-trimethoprim (BACTRIM DS,SEPTRA DS) 800-160 MG tablet Take 1 tablet by mouth 3 (three) times a week. 08/18/16   [provider]  traMADol (ULTRAM) 50 MG tablet Take 1 tablet (50 mg total) by mouth every 6 (six) hours as needed for moderate pain. 09/21/16   Dustin Flock, MD    Allergies Clindamycin/lincomycin; Ivp dye [iodinated diagnostic agents]; Penicillins;  Amlodipine; Avelox [moxifloxacin hcl in nacl]; Codeine; Erythromycin; Monosodium glutamate; Shellfish allergy; and Zostavax [zoster vaccine live]  No family history on file.  Social History Social History   Tobacco Use  . Smoking status: Never Smoker  . Smokeless tobacco: Never Used  Substance Use Topics  . Alcohol use: No  . Drug use: No    Review of Systems Constitutional: No fever/chills Eyes: No visual  changes. ENT: No sore throat. Cardiovascular: Denies chest pain. Respiratory: Denies shortness of breath. Gastrointestinal: No abdominal pain.  No nausea, no vomiting.  No diarrhea.  No constipation. Genitourinary: Negative for dysuria. Musculoskeletal: Negative for back pain. Skin: Negative for rash. Neurological: Negative for headaches, focal weakness or numbness.   ____________________________________________   PHYSICAL EXAM:  VITAL SIGNS: ED Triage Vitals  Enc Vitals Group     BP 10/08/17 2249 (!) 209/86     Pulse Rate 10/08/17 2249 62     Resp 10/08/17 2249 18     Temp 10/08/17 2249 98.2 F (36.8 C)     Temp Source 10/08/17 2249 Oral     SpO2 10/08/17 2249 97 %     Weight 10/08/17 2250 128 lb (58.1 kg)     Height 10/08/17 2250 5\' 4"  (1.626 m)     Head Circumference --      Peak Flow --      Pain Score 10/08/17 2250 0     Pain Loc --      Pain Edu? --      Excl. in [place]? --     Constitutional: Alert and oriented x4 pleasant cooperative speaks in full clear sentences no diaphoresis Eyes: PERRL EOMI. Head: Atraumatic. Nose: No congestion/rhinnorhea. Mouth/Throat: No trismus Neck: No stridor.   Cardiovascular: Normal rate, regular rhythm. Grossly normal heart sounds.  Good peripheral circulation. Respiratory: Normal respiratory effort.  No retractions. Lungs CTAB and moving good air Gastrointestinal: Soft nontender Musculoskeletal: No lower extremity edema   Neurologic:  Normal speech and language. No gross focal neurologic deficits are appreciated. Skin:  Skin is warm, dry and intact. No rash noted. Psychiatric: Mood and affect are normal. Speech and behavior are normal.    ____________________________________________   DIFFERENTIAL includes but not limited to  Asymptomatic hypertension, hypertensive emergency, intracerebral hemorrhage, renal failure ____________________________________________   LABS (all labs ordered are listed, but only abnormal results  are displayed)  Labs Reviewed  BASIC METABOLIC PANEL - Abnormal; Notable for the following components:      Result Value   Glucose, Bld 111 (*)    All other components within normal limits    Lab work reviewed by me with normal renal function __________________________________________  EKG   ____________________________________________  RADIOLOGY   ____________________________________________   PROCEDURES  Procedure(s) performed: no  Procedures  Critical Care performed: no  ____________________________________________   INITIAL IMPRESSION / ASSESSMENT AND PLAN / ED COURSE  Pertinent labs & imaging results that were available during my care of the patient were reviewed by me and considered in my medical decision making (see chart for details).   The patient arrives well-appearing with asymptomatic hypertension.  On further discussion she had a close family friend die last night and this very well could be contributing to her anxiety and elevated blood pressure.  I will give her an extra dose of hydralazine now and check a BMP to evaluate for her renal function.    ----------------------------------------- 12:51 AM on 10/09/2017 -----------------------------------------  The patient's blood pressure remains elevated however she feels improved.  At this point she is medically stable for outpatient management with follow-up for blood pressure recheck within 1 week with her primary care physician.  Strict return precautions have been given and the patient and her husband verbalized understanding and agreement with the plan. ____________________________________________   FINAL CLINICAL IMPRESSION(S) / ED DIAGNOSES  Final diagnoses:  Hypertension, unspecified type      NEW MEDICATIONS STARTED DURING THIS VISIT:  Discharge Medication List as of 10/09/2017 12:51 AM       Note:  This document was prepared using Dragon voice recognition software and may include  unintentional dictation errors.     Darel Hong, MD 10/11/17 2318

## 2017-10-08 NOTE — ED Notes (Signed)
Pt's husband inquiring "what is taking the doctor so long?" explanation of ed md evaluation wait time provided to pt and spouse. Warm blankets provided to spouse.

## 2017-10-09 LAB — BASIC METABOLIC PANEL
ANION GAP: 9 (ref 5–15)
BUN: 14 mg/dL (ref 8–23)
CALCIUM: 9 mg/dL (ref 8.9–10.3)
CO2: 27 mmol/L (ref 22–32)
CREATININE: 0.77 mg/dL (ref 0.44–1.00)
Chloride: 103 mmol/L (ref 98–111)
GFR calc non Af Amer: >60 mL/min (ref >60)
Glucose, Bld: 111 mg/dL — ABNORMAL HIGH (ref 70–99)
Potassium: 3.6 mmol/L (ref 3.5–5.1)
SODIUM: 139 mmol/L (ref 135–145)

## 2017-10-09 NOTE — Discharge Instructions (Signed)
Please make an appointment to follow-up with primary care within 1 week for recheck of your blood pressure.  Return to the emergency department for any concerns whatsoever.  It was a pleasure to take care of you today, and thank you for coming to our emergency department.  If you have any questions or concerns before leaving please ask the nurse to grab me and I'm more than happy to go through your aftercare instructions again.  If you were prescribed any opioid pain medication today such as Norco, Vicodin, Percocet, morphine, hydrocodone, or oxycodone please make sure you do not drive when you are taking this medication as it can alter your ability to drive safely.  If you have any concerns once you are home that you are not improving or are in fact getting worse before you can make it to your follow-up appointment, please do not hesitate to call 911 and come back for further evaluation.  Darel Hong, MD  Results for orders placed or performed during the hospital encounter of 87/68/11  Basic metabolic panel  Result Value Ref Range   Sodium 139 135 - 145 mmol/L   Potassium 3.6 3.5 - 5.1 mmol/L   Chloride 103 98 - 111 mmol/L   CO2 27 22 - 32 mmol/L   Glucose, Bld 111 (H) 70 - 99 mg/dL   BUN 14 8 - 23 mg/dL   Creatinine, Ser 0.77 0.44 - 1.00 mg/dL   Calcium 9.0 8.9 - 10.3 mg/dL   GFR calc non Af Amer >60 >60 mL/min   GFR calc Af Amer >60 >60 mL/min   Anion gap 9 5 - 15

## 2018-02-10 ENCOUNTER — Observation Stay
Admission: EM | Admit: 2018-02-10 | Discharge: 2018-02-12 | Disposition: A | Discharge status: Home / Self Care | Payer: Medicare HMO | Attending: Internal Medicine | Admitting: Internal Medicine

## 2018-02-10 ENCOUNTER — Emergency Department: Payer: Medicare HMO

## 2018-02-10 ENCOUNTER — Observation Stay: Payer: Medicare HMO

## 2018-02-10 ENCOUNTER — Other Ambulatory Visit: Payer: Self-pay

## 2018-02-10 ENCOUNTER — Encounter: Payer: Self-pay | Admitting: Emergency Medicine

## 2018-02-10 DIAGNOSIS — M5136 Other intervertebral disc degeneration, lumbar region: Secondary | ICD-10-CM | POA: Insufficient documentation

## 2018-02-10 DIAGNOSIS — Z888 Allergy status to other drugs, medicaments and biological substances status: Secondary | ICD-10-CM | POA: Insufficient documentation

## 2018-02-10 DIAGNOSIS — Z91013 Allergy to seafood: Secondary | ICD-10-CM | POA: Diagnosis not present

## 2018-02-10 DIAGNOSIS — Z887 Allergy status to serum and vaccine status: Secondary | ICD-10-CM | POA: Insufficient documentation

## 2018-02-10 DIAGNOSIS — M5412 Radiculopathy, cervical region: Secondary | ICD-10-CM | POA: Insufficient documentation

## 2018-02-10 DIAGNOSIS — Z881 Allergy status to other antibiotic agents status: Secondary | ICD-10-CM | POA: Insufficient documentation

## 2018-02-10 DIAGNOSIS — Z880 Allergy status to penicillin: Secondary | ICD-10-CM | POA: Insufficient documentation

## 2018-02-10 DIAGNOSIS — Z7982 Long term (current) use of aspirin: Secondary | ICD-10-CM | POA: Diagnosis not present

## 2018-02-10 DIAGNOSIS — I10 Essential (primary) hypertension: Secondary | ICD-10-CM

## 2018-02-10 DIAGNOSIS — M79602 Pain in left arm: Secondary | ICD-10-CM

## 2018-02-10 DIAGNOSIS — Z79899 Other long term (current) drug therapy: Secondary | ICD-10-CM | POA: Diagnosis not present

## 2018-02-10 DIAGNOSIS — E785 Hyperlipidemia, unspecified: Secondary | ICD-10-CM | POA: Diagnosis not present

## 2018-02-10 DIAGNOSIS — Z885 Allergy status to narcotic agent status: Secondary | ICD-10-CM | POA: Insufficient documentation

## 2018-02-10 DIAGNOSIS — Z91041 Radiographic dye allergy status: Secondary | ICD-10-CM | POA: Diagnosis not present

## 2018-02-10 DIAGNOSIS — F419 Anxiety disorder, unspecified: Secondary | ICD-10-CM | POA: Diagnosis not present

## 2018-02-10 DIAGNOSIS — Z87892 Personal history of anaphylaxis: Secondary | ICD-10-CM | POA: Diagnosis not present

## 2018-02-10 DIAGNOSIS — G459 Transient cerebral ischemic attack, unspecified: Secondary | ICD-10-CM | POA: Diagnosis not present

## 2018-02-10 DIAGNOSIS — M810 Age-related osteoporosis without current pathological fracture: Secondary | ICD-10-CM | POA: Diagnosis not present

## 2018-02-10 DIAGNOSIS — R29898 Other symptoms and signs involving the musculoskeletal system: Secondary | ICD-10-CM | POA: Diagnosis present

## 2018-02-10 LAB — COMPREHENSIVE METABOLIC PANEL
ALBUMIN: 3.4 g/dL — AB (ref 3.5–5.0)
ALT: 13 U/L (ref 0–44)
ANION GAP: 10 (ref 5–15)
AST: 17 U/L (ref 15–41)
Alkaline Phosphatase: 56 U/L (ref 38–126)
BILIRUBIN TOTAL: 0.7 mg/dL (ref 0.3–1.2)
BUN: 11 mg/dL (ref 8–23)
CO2: 27 mmol/L (ref 22–32)
Calcium: 9.1 mg/dL (ref 8.9–10.3)
Chloride: 102 mmol/L (ref 98–111)
Creatinine, Ser: 0.79 mg/dL (ref 0.44–1.00)
GFR calc Af Amer: >60 mL/min (ref >60)
GLUCOSE: 128 mg/dL — AB (ref 70–99)
Potassium: 3.8 mmol/L (ref 3.5–5.1)
Sodium: 139 mmol/L (ref 135–145)
TOTAL PROTEIN: 9.1 g/dL — AB (ref 6.5–8.1)

## 2018-02-10 LAB — DIFFERENTIAL
Abs Immature Granulocytes: 0.01 10*3/uL (ref 0.00–0.07)
BASOS PCT: 1 %
Basophils Absolute: 0 10*3/uL (ref 0.0–0.1)
EOS ABS: 0.1 10*3/uL (ref 0.0–0.5)
EOS PCT: 2 %
IMMATURE GRANULOCYTES: 0 %
LYMPHS ABS: 1.4 10*3/uL (ref 0.7–4.0)
Lymphocytes Relative: 36 %
Monocytes Absolute: 0.5 10*3/uL (ref 0.1–1.0)
Monocytes Relative: 11 %
NEUTROS PCT: 50 %
Neutro Abs: 2 10*3/uL (ref 1.7–7.7)

## 2018-02-10 LAB — URINALYSIS, COMPLETE (UACMP) WITH MICROSCOPIC
BILIRUBIN URINE: NEGATIVE
Bacteria, UA: NONE SEEN
GLUCOSE, UA: NEGATIVE mg/dL
Hgb urine dipstick: NEGATIVE
KETONES UR: NEGATIVE mg/dL
Leukocytes, UA: NEGATIVE
NITRITE: NEGATIVE
PH: 8 (ref 5.0–8.0)
Protein, ur: NEGATIVE mg/dL
Specific Gravity, Urine: 1.004 — ABNORMAL LOW (ref 1.005–1.030)
Squamous Epithelial / LPF: NONE SEEN (ref 0–5)

## 2018-02-10 LAB — CBC
HEMATOCRIT: 38 % (ref 36.0–46.0)
Hemoglobin: 12.3 g/dL (ref 12.0–15.0)
MCH: 28.9 pg (ref 26.0–34.0)
MCHC: 32.4 g/dL (ref 30.0–36.0)
MCV: 89.4 fL (ref 80.0–100.0)
PLATELETS: 196 10*3/uL (ref 150–400)
RBC: 4.25 MIL/uL (ref 3.87–5.11)
RDW: 15.9 % — AB (ref 11.5–15.5)
WBC: 4 10*3/uL (ref 4.0–10.5)
nRBC: 0 % (ref 0.0–0.2)

## 2018-02-10 LAB — PROTIME-INR
INR: 1.06
PROTHROMBIN TIME: 13.7 s (ref 11.4–15.2)

## 2018-02-10 LAB — APTT: aPTT: 37 seconds — ABNORMAL HIGH (ref 24–36)

## 2018-02-10 LAB — TROPONIN I

## 2018-02-10 MED ORDER — ACETAMINOPHEN 325 MG PO TABS: 650.0000 mg | ORAL_TABLET | Freq: Four times a day (QID) | ORAL | Status: DC | PRN

## 2018-02-10 MED ORDER — LABETALOL HCL 5 MG/ML IV SOLN
10.0000 mg | Freq: Once | INTRAVENOUS | Status: AC
  Administered 2018-02-10: 10 mg via INTRAVENOUS

## 2018-02-10 MED ORDER — LABETALOL HCL 5 MG/ML IV SOLN
INTRAVENOUS | Status: AC
  Filled 2018-02-10: qty 4

## 2018-02-10 MED ORDER — ACETAMINOPHEN 325 MG PO TABS
650.0000 mg | ORAL_TABLET | Freq: Once | ORAL | Status: AC
  Administered 2018-02-10: 650 mg via ORAL

## 2018-02-10 MED ORDER — SENNOSIDES-DOCUSATE SODIUM 8.6-50 MG PO TABS: 2.0000 | ORAL_TABLET | Freq: Two times a day (BID) | ORAL | Status: DC | PRN

## 2018-02-10 MED ORDER — LABETALOL HCL 5 MG/ML IV SOLN
20.0000 mg | Freq: Once | INTRAVENOUS | Status: AC
  Administered 2018-02-10: 20 mg via INTRAVENOUS
  Filled 2018-02-10: qty 4

## 2018-02-10 MED ORDER — TRAMADOL HCL 50 MG PO TABS
50.0000 mg | ORAL_TABLET | Freq: Four times a day (QID) | ORAL | Status: DC | PRN
  Filled 2018-02-10: qty 1

## 2018-02-10 MED ORDER — LISINOPRIL 20 MG PO TABS
40.0000 mg | ORAL_TABLET | Freq: Every day | ORAL | Status: DC
  Administered 2018-02-10: 40 mg via ORAL
  Filled 2018-02-10 (×3): qty 2

## 2018-02-10 MED ORDER — CARVEDILOL 25 MG PO TABS
25.0000 mg | ORAL_TABLET | Freq: Two times a day (BID) | ORAL | Status: DC
  Administered 2018-02-11 – 2018-02-12 (×3): 25 mg via ORAL
  Filled 2018-02-10 (×4): qty 1

## 2018-02-10 MED ORDER — ASPIRIN EC 81 MG PO TBEC
81.0000 mg | DELAYED_RELEASE_TABLET | Freq: Every day | ORAL | Status: DC
  Administered 2018-02-11 – 2018-02-12 (×2): 81 mg via ORAL
  Filled 2018-02-10 (×2): qty 1

## 2018-02-10 MED ORDER — ATORVASTATIN CALCIUM 20 MG PO TABS
40.0000 mg | ORAL_TABLET | Freq: Every day | ORAL | Status: DC
  Filled 2018-02-10: qty 2

## 2018-02-10 MED ORDER — ACETAMINOPHEN 325 MG PO TABS
ORAL_TABLET | ORAL | Status: AC
  Filled 2018-02-10: qty 2

## 2018-02-10 MED ORDER — GABAPENTIN 600 MG PO TABS
300.0000 mg | ORAL_TABLET | Freq: Every day | ORAL | Status: DC
  Administered 2018-02-10 – 2018-02-11 (×2): 300 mg via ORAL
  Filled 2018-02-10 (×2): qty 1

## 2018-02-10 MED ORDER — HYDRALAZINE HCL 50 MG PO TABS
150.0000 mg | ORAL_TABLET | Freq: Three times a day (TID) | ORAL | Status: DC
  Administered 2018-02-10: 100 mg via ORAL
  Filled 2018-02-10 (×3): qty 3

## 2018-02-10 MED ORDER — LABETALOL HCL 5 MG/ML IV SOLN: 10.0000 mg | INTRAVENOUS | Status: DC | PRN

## 2018-02-10 MED ORDER — STROKE: EARLY STAGES OF RECOVERY BOOK: Freq: Once | Status: DC

## 2018-02-10 MED ORDER — ASPIRIN EC 325 MG PO TBEC
325.0000 mg | DELAYED_RELEASE_TABLET | Freq: Every day | ORAL | Status: DC
  Filled 2018-02-10 (×2): qty 1

## 2018-02-10 MED ORDER — ENOXAPARIN SODIUM 40 MG/0.4ML ~~LOC~~ SOLN
40.0000 mg | SUBCUTANEOUS | Status: DC
  Administered 2018-02-10 – 2018-02-11 (×2): 40 mg via SUBCUTANEOUS
  Filled 2018-02-10 (×2): qty 0.4

## 2018-02-10 NOTE — ED Notes (Signed)
Patient c/o seeing black dots in both eyes and c/o left arm going numb. Dr. Cinda Quest aware.

## 2018-02-10 NOTE — ED Notes (Signed)
Patient taken to CT scan. Patient's daughter states patient c/o seeing more black dots and lower abdominal pain.

## 2018-02-10 NOTE — ED Notes (Signed)
Patient ambulated with stand-by assist and steady gait to the room commode. Patient was able to independently reposition herself on the stretcher. Patient was given Tylenol for headache.

## 2018-02-10 NOTE — ED Triage Notes (Signed)
Pt to ED via POV. Pt dtr states that about 30 minutes PTA, pt took her blood pressure and her blood pressure was very high. Pt asked dtr to bring her to the hospital. Pt dtr states that on the way to the ED pt was unable to talk. Upon arrival to ED pt able to talk at a whisper. Pt was able to stand and get out of the car. Pt did have some stuttering in triage, however she is now speaking clearly. Pt denies numbness or weakness, no facial droop noted, grips equal bilaterally. Pt has been under increased stress recently, her best friends funeral was today and husband has been having a decline in health. Pt is hypertensive in triage but does not appear to be in any distress.

## 2018-02-10 NOTE — ED Provider Notes (Signed)
[facility] Emergency Department Provider Note   ____________________________________________   First MD Initiated Contact with Patient 02/10/18 1558     (approximate)  I have reviewed the triage vital signs and the nursing notes.   HISTORY  Chief Complaint Hypertension and Aphasia    HPI Janet Duffy is a 84 y.o. female found her blood pressure to be very high.  She came to the hospital.  On the way here she had trouble speaking.  Talk at all and she was able to talk in a whisper and finally when she got into the emergency room she has stuttering and slurry speech and began speaking clearly.  When I saw her she developed numbness in the left arm that lasted for possibly 15 minutes and went away.  Patient has not had any weakness.  Patient speech is now normal.  Patient's blood pressure was extremely high came down with some labetalol 10 mg IV.  Patient also took her antihypertensive medicine little over an hour before I saw her.  She has never had trouble with numbness or being unable to speak before.  She also complained of seeing some black dots in front of both eyes.  This improved when the blood pressure went down as did the arm numbness which went away.  She is under a lot of stress.  She very recently lost her best friend and the funeral was today and her husband's having declining health.   Past Medical History:  Diagnosis Date  . Connective tissue disease ([place])   . DDD (degenerative disc disease), lumbar   . Diverticulitis   . Granuloma annulare   . HTN (hypertension)   . Osteoporosis     Patient Active Problem List   Diagnosis Date Noted  . Acute diverticulitis 06/18/2016    History reviewed. No pertinent surgical history.  Prior to Admission medications   Medication Sig Start Date End Date Taking? Authorizing Provider  acetaminophen (TYLENOL) 325 MG tablet Take 2 tablets (650 mg total) by mouth every 6 (six) hours as needed for mild pain  (or Fever >/= 101). 09/21/16   Dustin Flock, MD  carvedilol (COREG) 25 MG tablet Take 25 mg by mouth 2 (two) times daily. 05/18/16   [provider]  Cholecalciferol (VITAMIN D3) 1000 units CAPS Take 1,000 Units by mouth daily.    [provider]  hydrALAZINE (APRESOLINE) 50 MG tablet Take 25 mg by mouth 2 (two) times daily.  05/24/16 05/24/17  [provider]  lisinopril (PRINIVIL,ZESTRIL) 20 MG tablet Take 20 mg by mouth daily. 04/22/16 04/22/17  [provider]  nitroGLYCERIN (NITROGLYN) 2 % ointment Apply 1 inch topically 2 (two) times daily as needed. Use on affected fingers twice daily as needed for Raynauds 02/20/14   [provider]  omeprazole (PRILOSEC) 20 MG capsule Take 20 mg by mouth daily. 12/04/15   [provider]  ondansetron (ZOFRAN ODT) 4 MG disintegrating tablet Take 1 tablet (4 mg total) by mouth every 8 (eight) hours as needed for nausea or vomiting. 07/21/16   Rudene Re, MD  ondansetron (ZOFRAN) 4 MG tablet Take 1 tablet (4 mg total) by mouth every 6 (six) hours as needed for nausea. 09/21/16   Dustin Flock, MD  psyllium (REGULOID) 0.52 g capsule Take 0.52 g by mouth 2 (two) times daily.    [provider]  senna (SENOKOT) 8.6 MG TABS tablet Take 1 tablet (8.6 mg total) by mouth daily. 07/21/16   Rudene Re, MD  spironolactone (ALDACTONE) 25 MG tablet Take 25 mg by mouth daily. ' 06/03/16 06/03/17  [provider]  sulfamethoxazole-trimethoprim (BACTRIM DS,SEPTRA DS) 800-160 MG tablet Take 1 tablet by mouth 3 (three) times a week. 08/18/16   [provider]  traMADol (ULTRAM) 50 MG tablet Take 1 tablet (50 mg total) by mouth every 6 (six) hours as needed for moderate pain. 09/21/16   Dustin Flock, MD    Allergies Clindamycin/lincomycin; Ivp dye [iodinated diagnostic agents]; Penicillins; Amlodipine; Avelox [moxifloxacin hcl in nacl]; Codeine; Erythromycin; Monosodium glutamate; Shellfish  allergy; and Zostavax [zoster vaccine live]  No family history on file.  Social History Social History   Tobacco Use  . Smoking status: Never Smoker  . Smokeless tobacco: Never Used  Substance Use Topics  . Alcohol use: No  . Drug use: No    Review of Systems  Constitutional: No fever/chills Eyes: No visual changes. ENT: No sore throat. Cardiovascular: Denies chest pain. Respiratory: Denies shortness of breath. Gastrointestinal: No abdominal pain.  No nausea, no vomiting.  No diarrhea.  No constipation. Genitourinary: Negative for dysuria. Musculoskeletal: Negative for back pain. Skin: Negative for rash. Neurological: See HPI.  Patient did also report a headache which improved when her blood pressure came down.  ____________________________________________   PHYSICAL EXAM:  VITAL SIGNS: ED Triage Vitals  Enc Vitals Group     BP 02/10/18 1551 (!) 218/85     Pulse Rate 02/10/18 1551 72     Resp 02/10/18 1551 16     Temp 02/10/18 1551 98.4 F (36.9 C)     Temp Source 02/10/18 1551 Oral     SpO2 02/10/18 1551 97 %     Weight 02/10/18 1552 128 lb (58.1 kg)     Height --      Head Circumference --      Peak Flow --      Pain Score 02/10/18 1552 0     Pain Loc --      Pain Edu? --      Excl. in [place]? --     Constitutional: Alert and oriented.  Patient looks diffusely weak and worn out. Eyes: Conjunctivae are normal. PER. EOMI. Head: Atraumatic. Nose: No congestion/rhinnorhea. Mouth/Throat: Mucous membranes are moist.  Oropharynx non-erythematous. Neck: No stridor.   Cardiovascular: Normal rate, regular rhythm. Grossly normal heart sounds.  Good peripheral circulation. Respiratory: Normal respiratory effort.  No retractions. Lungs CTAB. Gastrointestinal: Soft and nontender. No distention. No abdominal bruits. No CVA tenderness. Musculoskeletal: No lower extremity tenderness nor edema. Neurologic:  Normal speech and language. No gross focal neurologic deficits are  appreciated.  Patient is diffusely weak there is no focal weakness.  She is able to maintain both arms without any drift.  She is able to maintain both legs up without any drift.   Skin:  Skin is warm, dry and intact. No rash noted. Psychiatric: Mood and affect appear depressed. Speech and behavior Appear depressed ____________________________________________   LABS (all labs ordered are listed, but only abnormal results are displayed)  Labs Reviewed  APTT - Abnormal; Notable for the following components:      Result Value   aPTT 37 (*)    All other components within normal limits  CBC - Abnormal; Notable for the following components:   RDW 15.9 (*)    All other components within normal limits  COMPREHENSIVE METABOLIC PANEL - Abnormal; Notable for the following components:   Glucose, Bld 128 (*)    Total Protein 9.1 (*)  Albumin 3.4 (*)    All other components within normal limits  URINALYSIS, COMPLETE (UACMP) WITH MICROSCOPIC - Abnormal; Notable for the following components:   Color, Urine COLORLESS (*)    APPearance CLEAR (*)    Specific Gravity, Urine 1.004 (*)    All other components within normal limits  PROTIME-INR  DIFFERENTIAL  TROPONIN I  CBG MONITORING, ED   ____________________________________________  EKG EKG read interpreted by me shows normal sinus rhythm rate of 68 normal axis no acute ST segment changes seen  ____________________________________________  RADIOLOGY  ED MD interpretation:    Official radiology report(s): No results found.  ____________________________________________   PROCEDURES  Procedure(s) performed:   Procedures  Critical Care performed:   ____________________________________________   INITIAL IMPRESSION / ASSESSMENT AND PLAN / ED COURSE Patient's blood pressure goes back up after labetalol and her left arm pain begins tingling again then she gets pain in the back of the left shoulder.  We will give her some more  labetalol get the chest x-ray and see if that shows anything and will go from there.  Plan on admitting the patient for TIA symptoms.      Clinical Course as of Feb 11 1723  Sat Feb 10, 2018  1716 Total Protein(!): 9.1 [PM]    Clinical Course User Index [PM] Nena Polio, MD     ____________________________________________   FINAL CLINICAL IMPRESSION(S) / ED DIAGNOSES  Final diagnoses:  Hypertension, unspecified type  TIA (transient ischemic attack)     ED Discharge Orders    None       Note:  This document was prepared using Dragon voice recognition software and may include unintentional dictation errors.    Nena Polio, MD 02/10/18 1740

## 2018-02-10 NOTE — ED Notes (Signed)
.  This RN Curlene Labrum received verbal orders to administer:10 mg of Labetalol IV for BP Administered 5:32 PM. RN will continue to monitor.'

## 2018-02-10 NOTE — ED Notes (Signed)
Pt assisted to restroom, placed back on monitor.  PT c/o L arm pain, assigned nurse informed.

## 2018-02-10 NOTE — ED Notes (Signed)
Patient states her left arm numbness is resolving. Dr. Cinda Quest aware.

## 2018-02-10 NOTE — H&P (Signed)
[facility] NAME: Janet Duffy    MR#:  [id number]  DATE OF BIRTH:  May 25, 1931  DATE OF ADMISSION:  02/10/2018  PRIMARY CARE PHYSICIAN: Don Broach, MD   REQUESTING/REFERRING PHYSICIAN: Conni Slipper, MD  CHIEF COMPLAINT:   Chief Complaint  Patient presents with  . Hypertension  . Aphasia    HISTORY OF PRESENT ILLNESS:  Janet Duffy  is a 84 y.o. female with a known history of hypertension, osteoporosis, connective tissue disease who presented to the ED with elevated blood pressures and left arm weakness and numbness that started this morning.  Her blood pressure was elevated to 214/107 this morning, so her daughter brought her to the emergency department.  While en route by personal vehicle, patient developed left arm pain, weakness, and numbness.  The pain starts in the shoulder and travels down the entire arm.  She also developed inability to speak, but this resolved on its own.  Since being in the ED, she is also been experiencing some lightheadedness.  No vision changes.  No weakness, numbness, or tingling in the left leg.  In the ED, BPs were elevated to 218/85.  Labs were unremarkable.  CT head was normal.  She was given multiple doses of labetalol to help with her blood pressure.  Hospitalists were called for admission.  PAST MEDICAL HISTORY:   Past Medical History:  Diagnosis Date  . Connective tissue disease ([place])   . DDD (degenerative disc disease), lumbar   . Diverticulitis   . Granuloma annulare   . HTN (hypertension)   . Osteoporosis     PAST SURGICAL HISTORY:  History reviewed. No pertinent surgical history.  SOCIAL HISTORY:   Social History   Tobacco Use  . Smoking status: Never Smoker  . Smokeless tobacco: Never Used  Substance Use Topics  . Alcohol use: No    FAMILY HISTORY:  No family history on file.  DRUG ALLERGIES:   Allergies  Allergen Reactions  . Clindamycin/Lincomycin  Anaphylaxis  . Codeine Nausea Only  . Erythromycin Anaphylaxis  . Ivp Dye [Iodinated Diagnostic Agents] Anaphylaxis  . Metrizamide Anaphylaxis  . Monosodium Glutamate Shortness Of Breath and Swelling    Tongue swelling  . Moxifloxacin Anaphylaxis  . Penicillins Anaphylaxis and Rash    .Has patient had a PCN reaction causing immediate rash, facial/tongue/throat swelling, SOB or lightheadedness with hypotension: Yes Has patient had a PCN reaction causing severe rash involving mucus membranes or skin necrosis: No Has patient had a PCN reaction that required hospitalization: Unknown Has patient had a PCN reaction occurring within the last 10 years: Yes If all of the above answers are "NO", then may proceed with Cephalosporin use.   . Amlodipine Swelling  . Avelox [Moxifloxacin Hcl In Nacl]   . Shellfish Allergy Hives    seafood  . Zoster Vaccine Live Rash    REVIEW OF SYSTEMS:   Review of Systems  Constitutional: Negative for chills and fever.  HENT: Negative for congestion and sore throat.   Eyes: Negative for blurred vision and double vision.  Respiratory: Negative for cough and shortness of breath.   Cardiovascular: Negative for chest pain, palpitations and claudication.  Gastrointestinal: Negative for abdominal pain, nausea and vomiting.  Genitourinary: Negative for dysuria, frequency and urgency.  Musculoskeletal: Positive for joint pain. Negative for back pain, myalgias and neck pain.  Neurological: Positive for sensory change, speech change, focal weakness and headaches. Negative for dizziness, seizures and loss  of consciousness.  Psychiatric/Behavioral: Negative for depression. The patient is not nervous/anxious.    MEDICATIONS AT HOME:   Prior to Admission medications   Medication Sig Start Date End Date Taking? Authorizing Provider  carvedilol (COREG) 25 MG tablet Take 25 mg by mouth 2 (two) times daily. 05/18/16  Yes [provider]  Cholecalciferol (VITAMIN  D3) 1000 units CAPS Take 1,000 Units by mouth daily.   Yes [provider]  hydrALAZINE (APRESOLINE) 50 MG tablet Take 50-150 mg by mouth See admin instructions. Take 3 tablets (150MG ) by mouth every morning, 1 tablet (50MG ) at midday and 3 tablets (150MG ) by mouth every evening   Yes [provider]  hydrocortisone valerate ointment (WEST-CORT) 0.2 % Apply 1 application topically 2 (two) times daily as needed (skin irritation).   Yes [provider]  lisinopril (PRINIVIL,ZESTRIL) 20 MG tablet Take 20 mg by mouth daily. 04/22/16 02/10/18 Yes [provider]  nitroGLYCERIN (NITROGLYN) 2 % ointment Apply 1 inch topically 2 (two) times daily as needed. Use on affected fingers twice daily as needed for Raynauds 02/20/14  Yes [provider]  triamcinolone cream (KENALOG) 0.1 % Apply 1 application topically 2 (two) times daily as needed (itching).   Yes [provider]  Wheat Dextrin (BENEFIBER) POWD Take 1 Dose by mouth daily.   Yes [provider]  acetaminophen (TYLENOL) 325 MG tablet Take 2 tablets (650 mg total) by mouth every 6 (six) hours as needed for mild pain (or Fever >/= 101). 09/21/16   Dustin Flock, MD  ondansetron (ZOFRAN ODT) 4 MG disintegrating tablet Take 1 tablet (4 mg total) by mouth every 8 (eight) hours as needed for nausea or vomiting. 07/21/16   Rudene Re, MD  ondansetron (ZOFRAN) 4 MG tablet Take 1 tablet (4 mg total) by mouth every 6 (six) hours as needed for nausea. 09/21/16   Dustin Flock, MD  senna (SENOKOT) 8.6 MG TABS tablet Take 1 tablet (8.6 mg total) by mouth daily. 07/21/16   Rudene Re, MD  traMADol (ULTRAM) 50 MG tablet Take 1 tablet (50 mg total) by mouth every 6 (six) hours as needed for moderate pain. 09/21/16   Dustin Flock, MD      VITAL SIGNS:  Blood pressure (!) 184/90, pulse 71, temperature 98.4 F (36.9 C), temperature source Oral, resp. rate 18, weight 58.1 kg, SpO2 96  %.  PHYSICAL EXAMINATION:  Physical Exam  GENERAL:  84 y.o.-year-old patient lying in the bed with no acute distress.  EYES: Pupils equal, round, reactive to light and accommodation. No scleral icterus. Extraocular muscles intact.  HEENT: Head atraumatic, normocephalic. Oropharynx and nasopharynx clear.  Moist mucous membranes. NECK:  Supple, no jugular venous distention. No thyroid enlargement, no tenderness.  No tenderness to palpation of the neck. LUNGS: Normal breath sounds bilaterally, no wheezing, rales,rhonchi or crepitation. No use of accessory muscles of respiration.  CARDIOVASCULAR: RRR, S1, S2 normal. No murmurs, rubs, or gallops.  ABDOMEN: Soft, nontender, nondistended. Bowel sounds present. No organomegaly or mass.  EXTREMITIES: No pedal edema, cyanosis, or clubbing.  NEUROLOGIC: Cranial nerves II through XII are intact. 5/5 muscle strength in the RUE/RLE/LLE, 4/5 muscle strength in the LUE. Decreased sensation to light touch in the LUE. Speech normal. Gait not checked.  PSYCHIATRIC: The patient is alert and oriented x 3.  SKIN: No obvious rash, lesion, or ulcer.   LABORATORY PANEL:   CBC Recent Labs  Lab 02/10/18 1558  WBC 4.0  HGB 12.3  HCT 38.0  PLT 196   ------------------------------------------------------------------------------------------------------------------  Chemistries  Recent Labs  Lab 02/10/18 1558  NA 139  K 3.8  CL 102  CO2 27  GLUCOSE 128*  BUN 11  CREATININE 0.79  CALCIUM 9.1  AST 17  ALT 13  ALKPHOS 56  BILITOT 0.7   ------------------------------------------------------------------------------------------------------------------  Cardiac Enzymes Recent Labs  Lab 02/10/18 1558  TROPONINI <0.03   ------------------------------------------------------------------------------------------------------------------  RADIOLOGY:  Ct Head Wo Contrast  Result Date: 02/10/2018 CLINICAL DATA:  Speech difficulties. EXAM: CT HEAD WITHOUT  CONTRAST TECHNIQUE: Contiguous axial images were obtained from the base of the skull through the vertex without intravenous contrast. COMPARISON:  None. FINDINGS: Brain: Mild chronic ischemic white matter disease is noted. Probable old lacunar infarction seen in right basal ganglia. No mass effect or midline shift is noted. Ventricular size is within normal limits. There is no evidence of mass lesion, hemorrhage or acute infarction. Vascular: No hyperdense vessel or unexpected calcification. Skull: Normal. Negative for fracture or focal lesion. Sinuses/Orbits: No acute finding. Other: None. IMPRESSION: Mild chronic ischemic white matter disease. No acute intracranial abnormality seen. Electronically Signed   By: Marijo Conception, M.D.   On: 02/10/2018 17:29   Dg Chest Portable 1 View  Result Date: 02/10/2018 CLINICAL DATA:  Back pain.  Hypertension. EXAM: PORTABLE CHEST 1 VIEW COMPARISON:  Radiograph of March 16, 2017. FINDINGS: The heart size and mediastinal contours are within normal limits. Both lungs are clear. No pneumothorax or pleural effusion is noted. The visualized skeletal structures are unremarkable. IMPRESSION: No active disease. Electronically Signed   By: Marijo Conception, M.D.   On: 02/10/2018 18:19      IMPRESSION AND PLAN:   Left arm weakness/numbness/pain- concern for TIA versus stroke.  Could also be due to cervical radiculopathy given her pain that shoots down her arm.  CT head negative. Troponin negative and no chest pain, so do not think this is cardiac in etiology. -MRI brain to rule out stroke -X-ray of C-spine -Check A1c and lipid panel -Start aspirin and Lipitor -Neurology consult -ECHO -Carotid dopplers -PT/OT/SLP consult -Gabapentin qhs to see if this helps arm pain  Hypertensive emergency- BPs in the 200s/80s in the ED -Continue home coreg and hydralazine -Increase home lisinopril dose from 20 mg daily to 40 mg daily -Labetalol IV as needed  Hyperglycemia-  glucose 128 -Check a1c  All the records are reviewed and case discussed with ED provider. Management plans discussed with the patient, family and they are in agreement.  CODE STATUS: DNI  TOTAL TIME TAKING CARE OF THIS PATIENT: 45 minutes.    Berna Spare Mayo M.D on 02/10/2018 at 7:06 PM  Between 7am to 6pm - Pager - [phone]  After 6pm go to [email or web address] - password [email or web address] [facility]  [facility]  Office  [phone]  CC: Primary care physician; Don Broach, MD   Note: This dictation was prepared with Dragon dictation along with smaller phrase technology. Any transcriptional errors that result from this process are unintentional.

## 2018-02-10 NOTE — Progress Notes (Signed)
Family Meeting Note  Advance Directive:yes  Today a meeting took place with the Patient and daughter.  Patient is able to participate.  The following clinical team members were present during this meeting:MD  The following were discussed:Patient's diagnosis: left arm weakness, Patient's progosis: Unable to determine and Goals for treatment: DNI   Discussed patient's goals of care in regards to her age and medical conditions. Patient states that given her age, she would not want to be intubated. She feels overall pretty healthy and does not have many medical conditions, so she would still like full medical care.  Additional follow-up to be provided: prn  Time spent during discussion:20 minutes  Evette Doffing, MD

## 2018-02-11 ENCOUNTER — Observation Stay: Payer: Medicare HMO

## 2018-02-11 ENCOUNTER — Observation Stay: Admit: 2018-02-11 | Payer: Medicare HMO

## 2018-02-11 DIAGNOSIS — I10 Essential (primary) hypertension: Secondary | ICD-10-CM | POA: Diagnosis not present

## 2018-02-11 DIAGNOSIS — G459 Transient cerebral ischemic attack, unspecified: Secondary | ICD-10-CM | POA: Diagnosis not present

## 2018-02-11 LAB — LIPID PANEL
CHOL/HDL RATIO: 2.6 ratio
CHOLESTEROL: 108 mg/dL (ref 0–200)
HDL: 41 mg/dL (ref >40)
LDL Cholesterol: 60 mg/dL (ref 0–99)
Triglycerides: 34 mg/dL (ref <150)
VLDL: 7 mg/dL (ref 0–40)

## 2018-02-11 LAB — BASIC METABOLIC PANEL
Anion gap: 9 (ref 5–15)
BUN: 12 mg/dL (ref 8–23)
CALCIUM: 9.1 mg/dL (ref 8.9–10.3)
CHLORIDE: 101 mmol/L (ref 98–111)
CO2: 25 mmol/L (ref 22–32)
CREATININE: 0.82 mg/dL (ref 0.44–1.00)
GFR calc non Af Amer: >60 mL/min (ref >60)
Glucose, Bld: 154 mg/dL — ABNORMAL HIGH (ref 70–99)
Potassium: 3.2 mmol/L — ABNORMAL LOW (ref 3.5–5.1)
Sodium: 135 mmol/L (ref 135–145)

## 2018-02-11 LAB — HEMOGLOBIN A1C
HEMOGLOBIN A1C: 6 % — AB (ref 4.8–5.6)
MEAN PLASMA GLUCOSE: 125.5 mg/dL

## 2018-02-11 LAB — CBC
HEMATOCRIT: 32.8 % — AB (ref 36.0–46.0)
HEMOGLOBIN: 10.8 g/dL — AB (ref 12.0–15.0)
MCH: 29.4 pg (ref 26.0–34.0)
MCHC: 32.9 g/dL (ref 30.0–36.0)
MCV: 89.4 fL (ref 80.0–100.0)
NRBC: 0 % (ref 0.0–0.2)
Platelets: 177 10*3/uL (ref 150–400)
RBC: 3.67 MIL/uL — AB (ref 3.87–5.11)
RDW: 15.9 % — ABNORMAL HIGH (ref 11.5–15.5)
WBC: 3.9 10*3/uL — ABNORMAL LOW (ref 4.0–10.5)

## 2018-02-11 MED ORDER — HYDRALAZINE HCL 50 MG PO TABS
50.0000 mg | ORAL_TABLET | Freq: Two times a day (BID) | ORAL | Status: DC
  Administered 2018-02-12: 50 mg via ORAL
  Filled 2018-02-11: qty 1

## 2018-02-11 MED ORDER — HYDRALAZINE HCL 50 MG PO TABS
50.0000 mg | ORAL_TABLET | Freq: Two times a day (BID) | ORAL | Status: DC
  Administered 2018-02-11: 19:00:00 50 mg via ORAL

## 2018-02-11 MED ORDER — CITALOPRAM HYDROBROMIDE 20 MG PO TABS
10.0000 mg | ORAL_TABLET | Freq: Every day | ORAL | Status: DC
  Administered 2018-02-11 – 2018-02-12 (×2): 10 mg via ORAL
  Filled 2018-02-11 (×2): qty 1

## 2018-02-11 NOTE — Consult Note (Signed)
Reason for Consult: L sided weakness  Referring Physician: Dr. Vianne Bulls  CC: L sided weakness   HPI: Janet Duffy is an 84 y.o. female  with a known history of hypertension, osteoporosis, connective tissue disease who presented to the ED with elevated blood pressures and left arm weakness and numbness that started this yesteray.  symptoms self resolved and she is back to baseline.  States feels better after gabapentin was given to her.    Past Medical History:  Diagnosis Date  . Connective tissue disease ([place])   . DDD (degenerative disc disease), lumbar   . Diverticulitis   . Granuloma annulare   . HTN (hypertension)   . Osteoporosis     History reviewed. No pertinent surgical history.  No family history on file.  Social History:  reports that she has never smoked. She has never used smokeless tobacco. She reports that she does not drink alcohol or use drugs.  Allergies  Allergen Reactions  . Clindamycin/Lincomycin Anaphylaxis  . Codeine Nausea Only  . Erythromycin Anaphylaxis  . Ivp Dye [Iodinated Diagnostic Agents] Anaphylaxis  . Metrizamide Anaphylaxis  . Monosodium Glutamate Shortness Of Breath and Swelling    Tongue swelling  . Moxifloxacin Anaphylaxis  . Penicillins Anaphylaxis and Rash    .Has patient had a PCN reaction causing immediate rash, facial/tongue/throat swelling, SOB or lightheadedness with hypotension: Yes Has patient had a PCN reaction causing severe rash involving mucus membranes or skin necrosis: No Has patient had a PCN reaction that required hospitalization: Unknown Has patient had a PCN reaction occurring within the last 10 years: Yes If all of the above answers are "NO", then may proceed with Cephalosporin use.   . Tramadol     Patient discloses she "went crazy"  . Amlodipine Swelling  . Avelox [Moxifloxacin Hcl In Nacl]   . Shellfish Allergy Hives    seafood  . Zoster Vaccine Live Rash    Medications: I have reviewed the patient's current  medications.  ROS: History obtained from the patient  General ROS: negative for - chills, fatigue, fever, night sweats, weight gain or weight loss Psychological ROS: negative for - behavioral disorder, hallucinations, memory difficulties, mood swings or suicidal ideation Ophthalmic ROS: negative for - blurry vision, double vision, eye pain or loss of vision ENT ROS: negative for - epistaxis, nasal discharge, oral lesions, sore throat, tinnitus or vertigo Allergy and Immunology ROS: negative for - hives or itchy/watery eyes Hematological and Lymphatic ROS: negative for - bleeding problems, bruising or swollen lymph nodes Endocrine ROS: negative for - galactorrhea, hair pattern changes, polydipsia/polyuria or temperature intolerance Respiratory ROS: negative for - cough, hemoptysis, shortness of breath or wheezing Cardiovascular ROS: negative for - chest pain, dyspnea on exertion, edema or irregular heartbeat Gastrointestinal ROS: negative for - abdominal pain, diarrhea, hematemesis, nausea/vomiting or stool incontinence Genito-Urinary ROS: negative for - dysuria, hematuria, incontinence or urinary frequency/urgency Musculoskeletal ROS: negative for - joint swelling or muscular weakness Neurological ROS: as noted in HPI Dermatological ROS: negative for rash and skin lesion changes  Physical Examination: Blood pressure 129/66, pulse 64, temperature 97.9 F (36.6 C), temperature source Oral, resp. rate 18, height 5\' 4"  (1.626 m), weight 56.5 kg, SpO2 96 %.   Neurological Examination   Mental Status: Alert, oriented, thought content appropriate.  Speech fluent without evidence of aphasia.  Able to follow 3 step commands without difficulty. Cranial Nerves: II: Discs flat bilaterally; Visual fields grossly normal, pupils equal, round, reactive to light and accommodation III,IV, VI: ptosis  not present, extra-ocular motions intact bilaterally V,VII: smile symmetric, facial light touch sensation  normal bilaterally VIII: hearing normal bilaterally IX,X: gag reflex present XI: bilateral shoulder shrug XII: midline tongue extension Motor: Right : Upper extremity   5/5    Left:     Upper extremity   5/5  Lower extremity   5/5     Lower extremity   5/5 Tone and bulk:normal tone throughout; no atrophy noted Sensory: Pinprick and light touch intact throughout, bilaterally Deep Tendon Reflexes: 2+ and symmetric throughout Plantars: Right: downgoing   Left: downgoing Cerebellar: normal finger-to-nose, normal rapid alternating movements and normal heel-to-shin test Gait: not tested       Laboratory Studies:   Basic Metabolic Panel: Recent Labs  Lab 02/10/18 1558 02/11/18 0504  NA 139 135  K 3.8 3.2*  CL 102 101  CO2 27 25  GLUCOSE 128* 154*  BUN 11 12  CREATININE 0.79 0.82  CALCIUM 9.1 9.1    Liver Function Tests: Recent Labs  Lab 02/10/18 1558  AST 17  ALT 13  ALKPHOS 56  BILITOT 0.7  PROT 9.1*  ALBUMIN 3.4*   No results for input(s): LIPASE, AMYLASE in the last 168 hours. No results for input(s): AMMONIA in the last 168 hours.  CBC: Recent Labs  Lab 02/10/18 1558 02/11/18 0504  WBC 4.0 3.9*  NEUTROABS 2.0  --   HGB 12.3 10.8*  HCT 38.0 32.8*  MCV 89.4 89.4  PLT 196 177    Cardiac Enzymes: Recent Labs  Lab 02/10/18 1558  TROPONINI <0.03    BNP: Invalid input(s): POCBNP  CBG: No results for input(s): GLUCAP in the last 168 hours.  Microbiology: No results found for this or any previous visit.  Coagulation Studies: Recent Labs    02/10/18 1558  LABPROT 13.7  INR 1.06    Urinalysis:  Recent Labs  Lab 02/10/18 1624  COLORURINE COLORLESS*  LABSPEC 1.004*  PHURINE 8.0  GLUCOSEU NEGATIVE  HGBUR NEGATIVE  BILIRUBINUR NEGATIVE  KETONESUR NEGATIVE  PROTEINUR NEGATIVE  NITRITE NEGATIVE  LEUKOCYTESUR NEGATIVE    Lipid Panel:     Component Value Date/Time   CHOL 108 02/11/2018 0504   TRIG 34 02/11/2018 0504   HDL 41  02/11/2018 0504   CHOLHDL 2.6 02/11/2018 0504   VLDL 7 02/11/2018 0504   LDLCALC 60 02/11/2018 0504    HgbA1C:  Lab Results  Component Value Date   HGBA1C 6.0 (H) 02/11/2018    Urine Drug Screen:  No results found for: LABOPIA, COCAINSCRNUR, LABBENZ, AMPHETMU, THCU, LABBARB  Alcohol Level: No results for input(s): ETH in the last 168 hours.  Other results: EKG: normal EKG, normal sinus rhythm, unchanged from previous tracings.  Imaging: Dg Cervical Spine Complete  Result Date: 02/10/2018 CLINICAL DATA:  Acute onset of posterior neck pain. EXAM: CERVICAL SPINE - COMPLETE 4+ VIEW COMPARISON:  None. FINDINGS: There is no evidence of fracture. There is mild grade 1 retrolisthesis of C3 on C4, and grade 1 anterolisthesis of C4 on C5, reflecting underlying facet disease. Mild endplate degenerative change is noted along the lower cervical spine, with small anterior and posterior disc osteophyte complexes. Vertebral bodies demonstrate normal height. Prevertebral soft tissues are within normal limits. The provided odontoid view demonstrates no significant abnormality. The visualized lung apices are clear. IMPRESSION: 1. No evidence of fracture or subluxation along the cervical spine. 2. Mild degenerative change along the cervical spine. Electronically Signed   By: Garald Balding M.D.   On: 02/10/2018  23:36   Ct Head Wo Contrast  Result Date: 02/10/2018 CLINICAL DATA:  Speech difficulties. EXAM: CT HEAD WITHOUT CONTRAST TECHNIQUE: Contiguous axial images were obtained from the base of the skull through the vertex without intravenous contrast. COMPARISON:  None. FINDINGS: Brain: Mild chronic ischemic white matter disease is noted. Probable old lacunar infarction seen in right basal ganglia. No mass effect or midline shift is noted. Ventricular size is within normal limits. There is no evidence of mass lesion, hemorrhage or acute infarction. Vascular: No hyperdense vessel or unexpected calcification.  Skull: Normal. Negative for fracture or focal lesion. Sinuses/Orbits: No acute finding. Other: None. IMPRESSION: Mild chronic ischemic white matter disease. No acute intracranial abnormality seen. Electronically Signed   By: Marijo Conception, M.D.   On: 02/10/2018 17:29   Mr Brain Wo Contrast  Result Date: 02/11/2018 CLINICAL DATA:  Initial evaluation for acute speech difficulty, left-sided numbness. EXAM: MRI HEAD WITHOUT CONTRAST TECHNIQUE: Multiplanar, multiecho pulse sequences of the brain and surrounding structures were obtained without intravenous contrast. COMPARISON:  Prior CT from 02/10/2018 FINDINGS: Brain: Generalized age-related cerebral volume loss. Patchy and confluent T2/FLAIR hyperintensity within the periventricular deep white matter both cerebral hemispheres, most consistent with chronic microvascular ischemic disease, moderate to advanced in nature. No abnormal foci of restricted diffusion to suggest acute or subacute ischemia. Gray-white matter differentiation maintained. No areas of remote cortical infarction. No acute intracranial hemorrhage. Multiple scattered chronic micro hemorrhages seen clustered around the deep gray nuclei, pons, and cerebellum, most consistent with chronic poorly controlled hypertension. No mass lesion, midline shift or mass effect. No hydrocephalus. No extra-axial fluid collection. Incidental note made of a partially empty sella. Vascular: Major intracranial vascular flow voids well maintained. Skull and upper cervical spine: Craniocervical junction within normal limits. Degenerative spondylolysis at C5-6 with resultant mild to moderate spinal stenosis. Remainder the visualized upper cervical spine within normal limits. Bone marrow signal intensity normal. Scalp soft tissues unremarkable. Sinuses/Orbits: Patient status post bilateral ocular lens replacement. Scattered mucosal thickening throughout the ethmoidal air cells. Paranasal sinuses are otherwise clear.  Bilateral mastoid effusions noted, of doubtful significance in the acute setting. Other: None. IMPRESSION: 1. No acute intracranial infarct or other abnormality identified. 2. Age-related cerebral atrophy with moderate to advanced chronic microvascular ischemic disease. 3. Multiple chronic micro hemorrhages centered about the deep gray nuclei, brainstem, and cerebellum. Findings most consistent with sequelae of chronic hypertensive encephalopathy. Electronically Signed   By: Jeannine Boga M.D.   On: 02/11/2018 01:47   US Carotid Bilateral (at [facility] And [facility] Only)  Result Date: 02/11/2018 CLINICAL DATA:  84 year old female with left arm weakness EXAM: BILATERAL CAROTID DUPLEX ULTRASOUND TECHNIQUE: Pearline Cables scale imaging, color Doppler and duplex ultrasound were performed of bilateral carotid and vertebral arteries in the neck. COMPARISON:  Brain MRI obtained earlier today FINDINGS: Criteria: Quantification of carotid stenosis is based on velocity parameters that correlate the residual internal carotid diameter with NASCET-based stenosis levels, using the diameter of the distal internal carotid lumen as the denominator for stenosis measurement. The following velocity measurements were obtained: RIGHT ICA: 86/14 cm/sec CCA: 56/2 cm/sec SYSTOLIC ICA/CCA RATIO:  1.1 ECA:  114 cm/sec LEFT ICA: 97/14 cm/sec CCA: 130/86 cm/sec SYSTOLIC ICA/CCA RATIO:  1.0 ECA:  111 cm/sec RIGHT CAROTID ARTERY: Trace focal atherosclerotic plaque in the origin of the internal carotid artery. By peak systolic velocity criteria, the estimated stenosis remains less than 50%. RIGHT VERTEBRAL ARTERY:  Patent with normal antegrade flow. LEFT CAROTID ARTERY: Trace heterogeneous atherosclerotic plaque  in the carotid bifurcation. Plaque is relatively smooth. By peak systolic velocity criteria, the estimated stenosis remains less than 50%. LEFT VERTEBRAL ARTERY:  Patent with normal antegrade flow. IMPRESSION: 1. Mild (1-49%) stenosis proximal  right internal carotid artery secondary to trace focal heterogeneous atherosclerotic plaque. 2. Mild (1-49%) stenosis proximal left internal carotid artery secondary to trace focal heterogeneous but smooth atherosclerotic plaque. 3. Vertebral arteries are patent with normal antegrade flow. Signed, Criselda Peaches, MD, [facility] Electronically Signed   By: Jacqulynn Cadet M.D.   On: 02/11/2018 09:44   Dg Chest Portable 1 View  Result Date: 02/10/2018 CLINICAL DATA:  Back pain.  Hypertension. EXAM: PORTABLE CHEST 1 VIEW COMPARISON:  Radiograph of March 16, 2017. FINDINGS: The heart size and mediastinal contours are within normal limits. Both lungs are clear. No pneumothorax or pleural effusion is noted. The visualized skeletal structures are unremarkable. IMPRESSION: No active disease. Electronically Signed   By: Marijo Conception, M.D.   On: 02/10/2018 18:19     Assessment/Plan:   84 y.o. female  with a known history of hypertension, osteoporosis, connective tissue disease who presented to the ED with elevated blood pressures and left arm weakness and numbness that started this yesteray.  symptoms self resolved and she is back to baseline.  States feels better after gabapentin was given to her.  - No further imaging needed - Has hx of GI bleed so was taken off ASA - Pt states she had pain symptoms associated with numbness - Symptoms could be BP related which runs high at home - No further imaging neuro stand point - ASA if ok from GI stand point Moxie Kalil    02/11/2018, 1:22 PM

## 2018-02-11 NOTE — Care Management Obs Status (Signed)
[facility] NOTIFICATION   Patient Details  Name: Janet Duffy MRN: [id number] Date of Birth: Apr 11, 1931   Medicare Observation Status Notification Given:  Yes    Geovanni Rahming A Clothilde Tippetts, RN 02/11/2018, 1:56 PM

## 2018-02-11 NOTE — Evaluation (Signed)
Physical Therapy Evaluation Patient Details Name: Janet Duffy MRN: [id number] DOB: [birth year] Today's Date: 02/11/2018   History of Present Illness  Janet Duffy is an 84yo female who comes to [facility] with acute onset arm  pain, HTN , adn aphasia, resolved prior to arrival. PTA pt is full yindependent community dwelling adult, drives, no falls history.   Clinical Impression  Pt admitted with above diagnosis. Pt currently with functional limitations due to the deficits listed below (see "PT Problem List"). Upon entry, pt in bed, family and RN present. The pt is awake and agreeable to participate.  The pt is alert and oriented x3, pleasant, conversational, and following simple commands consistently. Pt reports full resolution of CC, but endorses continued head ache and upper trapezius pain. Patient is at functionally at baseline, all education completed, and time is given to address all questions/concerns. No additional skilled PT services needed at this time, PT signing off. PT recommends daily ambulation ad lib or with nursing staff as needed to prevent deconditioning.     Follow Up Recommendations No PT follow up    Equipment Recommendations  None recommended by PT    Recommendations for Other Services       Precautions / Restrictions Precautions Precautions: Fall Restrictions Weight Bearing Restrictions: No      Mobility  Bed Mobility Overal bed mobility: Independent                Transfers Overall transfer level: Independent                  Ambulation/Gait Ambulation/Gait assistance: Independent Gait Distance (Feet): 425 Feet Assistive device: None Gait Pattern/deviations: WFL(Within Functional Limits) Gait velocity: 1.68m/s.    General Gait Details: denies acute abnormality   Financial trader Rankin (Stroke Patients Only)       Balance Overall balance assessment: Independent                                            Pertinent Vitals/Pain Pain Assessment: (mild HA and neck pain with a chronic issue acutely exacerbated. )    Home Living Family/patient expects to be discharged to:: Private residence Living Arrangements: Spouse/significant other Available Help at Discharge: Family Type of Home: House Home Access: Stairs to enter Entrance Stairs-Rails: Right Entrance Stairs-Number of Steps: 2 Home Layout: One level Home Equipment: None      Prior Function Level of Independence: Independent               Hand Dominance        Extremity/Trunk Assessment   Upper Extremity Assessment Upper Extremity Assessment: Overall WFL for tasks assessed    Lower Extremity Assessment Lower Extremity Assessment: Overall WFL for tasks assessed       Communication   Communication: No difficulties  Cognition Arousal/Alertness: Awake/alert Behavior During Therapy: WFL for tasks assessed/performed Overall Cognitive Status: Within Functional Limits for tasks assessed                                        General Comments      Exercises     Assessment/Plan    PT Assessment Patent does not need any further PT services  PT  Problem List         PT Treatment Interventions      PT Goals (Current goals can be found in the Care Plan section)  Acute Rehab PT Goals PT Goal Formulation: All assessment and education complete, DC therapy    Frequency     Barriers to discharge        Co-evaluation               AM-PAC PT "6 Clicks" Daily Activity  Outcome Measure Difficulty turning over in bed (including adjusting bedclothes, sheets and blankets)?: None Difficulty moving from lying on back to sitting on the side of the bed? : None Difficulty sitting down on and standing up from a chair with arms (e.g., wheelchair, bedside commode, etc,.)?: None Help needed moving to and from a bed to chair (including a wheelchair)?: None Help needed  walking in hospital room?: None Help needed climbing 3-5 steps with a railing? : None 6 Click Score: 24    End of Session Equipment Utilized During Treatment: Gait belt Activity Tolerance: Patient tolerated treatment well Patient left: Other (comment);with family/visitor present;with call bell/phone within reach(in BR ) Nurse Communication: Mobility status PT Visit Diagnosis: Other symptoms and signs involving the nervous system (H68.616)    Time: 8372-9021 PT Time Calculation (min) (ACUTE ONLY): 10 min   Charges:   PT Evaluation $PT Eval Low Complexity: 1 Low          12:10 PM, 02/11/18 Etta Grandchild, PT, DPT Physical Therapist - [facility]  [phone] ([place])    , C 02/11/2018, 12:08 PM

## 2018-02-11 NOTE — Plan of Care (Signed)

## 2018-02-11 NOTE — Progress Notes (Signed)
Pnts blood pressure medications were verified with pnt on admission. Pnt requested to only take Hydralazine 100mg , pnt says that is what she takes at home.   Pnts blood pressure bottomed to 90/46 overnight after all of her night time medications had time to get in system. Monitored pressure which did come up to 102/58 20 mins later but pnt SBP has consistently been 100-105 and DBP of 55-58 since 4am. Pnt reports slight dizziness however was able to ambulate with me to bathroom without any issues.   Will relay to day nurse to discuss with MD changes in pressure overnight. Will continue to closely monitor. Also encouraged fluids and to inform staff or daughter at bedside if she does have any worsening symptoms of dizziness or lightheadedness

## 2018-02-11 NOTE — Plan of Care (Signed)
  Problem: Education: Goal: Knowledge of General Education information will improve Description Including pain rating scale, medication(s)/side effects and non-pharmacologic comfort measures 02/11/2018 1546 by Herbie Baltimore, RN Outcome: Progressing 02/11/2018 1326 by Herbie Baltimore, RN Outcome: Progressing   Problem: Health Behavior/Discharge Planning: Goal: Ability to manage health-related needs will improve 02/11/2018 1546 by Herbie Baltimore, RN Outcome: Progressing 02/11/2018 1326 by Herbie Baltimore, RN Outcome: Progressing   Problem: Clinical Measurements: Goal: Ability to maintain clinical measurements within normal limits will improve 02/11/2018 1546 by Herbie Baltimore, RN Outcome: Progressing 02/11/2018 1326 by Herbie Baltimore, RN Outcome: Progressing Goal: Will remain free from infection 02/11/2018 1546 by Herbie Baltimore, RN Outcome: Progressing 02/11/2018 1326 by Herbie Baltimore, RN Outcome: Progressing Goal: Diagnostic test results will improve 02/11/2018 1546 by Herbie Baltimore, RN Outcome: Progressing 02/11/2018 1326 by Herbie Baltimore, RN Outcome: Progressing Goal: Respiratory complications will improve 02/11/2018 1546 by Herbie Baltimore, RN Outcome: Progressing 02/11/2018 1326 by Herbie Baltimore, RN Outcome: Progressing Goal: Cardiovascular complication will be avoided 02/11/2018 1546 by Herbie Baltimore, RN Outcome: Progressing 02/11/2018 1326 by Herbie Baltimore, RN Outcome: Progressing   Problem: Activity: Goal: Risk for activity intolerance will decrease 02/11/2018 1546 by Herbie Baltimore, RN Outcome: Progressing 02/11/2018 1326 by Herbie Baltimore, RN Outcome: Progressing   Problem: Nutrition: Goal: Adequate nutrition will be maintained 02/11/2018 1546 by Herbie Baltimore, RN Outcome: Progressing 02/11/2018 1326 by Herbie Baltimore, RN Outcome:  Progressing   Problem: Coping: Goal: Level of anxiety will decrease 02/11/2018 1546 by Herbie Baltimore, RN Outcome: Progressing 02/11/2018 1326 by Herbie Baltimore, RN Outcome: Progressing   Problem: Elimination: Goal: Will not experience complications related to bowel motility 02/11/2018 1546 by Herbie Baltimore, RN Outcome: Progressing 02/11/2018 1326 by Herbie Baltimore, RN Outcome: Progressing Goal: Will not experience complications related to urinary retention 02/11/2018 1546 by Herbie Baltimore, RN Outcome: Progressing 02/11/2018 1326 by Herbie Baltimore, RN Outcome: Progressing   Problem: Pain Managment: Goal: General experience of comfort will improve 02/11/2018 1546 by Herbie Baltimore, RN Outcome: Progressing 02/11/2018 1326 by Herbie Baltimore, RN Outcome: Progressing   Problem: Safety: Goal: Ability to remain free from injury will improve 02/11/2018 1546 by Herbie Baltimore, RN Outcome: Progressing 02/11/2018 1326 by Herbie Baltimore, RN Outcome: Progressing   Problem: Skin Integrity: Goal: Risk for impaired skin integrity will decrease 02/11/2018 1546 by Herbie Baltimore, RN Outcome: Progressing 02/11/2018 1326 by Herbie Baltimore, RN Outcome: Progressing

## 2018-02-11 NOTE — Progress Notes (Signed)
[facility] [place] NAME: Janet Duffy    MR#:  [id number]  DATE OF BIRTH:  Dec 25, 1931  SUBJECTIVE: Patient admitted for left arm numbness, slurred speech happened at home found to have severely elevated blood pressure.  Received multiple doses of labetalol in the emergency room.  And hypotensive last night.  Patient daughter mentioned that patient is having labile blood pressure at home.  According to family patient lost her) yesterday and she is under a lot of stress because of that and also her husband who is sick.  Patient has no weakness or slurred speech today.  Patient still works part-time, very independent.  CHIEF COMPLAINT:   Chief Complaint  Patient presents with  . Hypertension  . Aphasia    REVIEW OF SYSTEMS:    Review of Systems  Constitutional: Negative for chills and fever.  HENT: Negative for hearing loss.   Eyes: Negative for blurred vision, double vision and photophobia.  Respiratory: Negative for cough, hemoptysis and shortness of breath.   Cardiovascular: Negative for palpitations, orthopnea and leg swelling.  Gastrointestinal: Negative for abdominal pain, diarrhea and vomiting.  Genitourinary: Negative for dysuria and urgency.  Musculoskeletal: Negative for myalgias and neck pain.  Skin: Negative for rash.  Neurological: Negative for dizziness, focal weakness, seizures, weakness and headaches.  Psychiatric/Behavioral: Negative for memory loss. The patient does not have insomnia.     Nutrition:  Tolerating Diet: Tolerating PT:      DRUG ALLERGIES:   Allergies  Allergen Reactions  . Clindamycin/Lincomycin Anaphylaxis  . Codeine Nausea Only  . Erythromycin Anaphylaxis  . Ivp Dye [Iodinated Diagnostic Agents] Anaphylaxis  . Metrizamide Anaphylaxis  . Monosodium Glutamate Shortness Of Breath and Swelling    Tongue swelling  . Moxifloxacin Anaphylaxis  . Penicillins Anaphylaxis and Rash    .Has  patient had a PCN reaction causing immediate rash, facial/tongue/throat swelling, SOB or lightheadedness with hypotension: Yes Has patient had a PCN reaction causing severe rash involving mucus membranes or skin necrosis: No Has patient had a PCN reaction that required hospitalization: Unknown Has patient had a PCN reaction occurring within the last 10 years: Yes If all of the above answers are "NO", then may proceed with Cephalosporin use.   . Tramadol     Patient discloses she "went crazy"  . Amlodipine Swelling  . Avelox [Moxifloxacin Hcl In Nacl]   . Shellfish Allergy Hives    seafood  . Zoster Vaccine Live Rash    VITALS:  Blood pressure 116/63, pulse 63, temperature 98 F (36.7 C), temperature source Oral, resp. rate 16, height 5\' 4"  (1.626 m), weight 56.5 kg, SpO2 96 %.  PHYSICAL EXAMINATION:   Physical Exam  GENERAL:  84 y.o.-year-old patient lying in the bed with no acute distress.  EYES: Pupils equal, round, reactive to light and accommodation. No scleral icterus. Extraocular muscles intact.  HEENT: Head atraumatic, normocephalic. Oropharynx and nasopharynx clear.  NECK:  Supple, no jugular venous distention. No thyroid enlargement, no tenderness.  LUNGS: Normal breath sounds bilaterally, no wheezing, rales,rhonchi or crepitation. No use of accessory muscles of respiration.  CARDIOVASCULAR: S1, S2 normal. No murmurs, rubs, or gallops.  ABDOMEN: Soft, nontender, nondistended. Bowel sounds present. No organomegaly or mass.  EXTREMITIES: No pedal edema, cyanosis, or clubbing.  NEUROLOGIC: Cranial nerves II through XII are intact. Muscle strength 5/5 in all extremities. Sensation intact. Gait not checked.  PSYCHIATRIC: The patient is alert and oriented x 3.  SKIN: No  obvious rash, lesion, or ulcer.    LABORATORY PANEL:   CBC Recent Labs  Lab 02/11/18 0504  WBC 3.9*  HGB 10.8*  HCT 32.8*  PLT 177    ------------------------------------------------------------------------------------------------------------------  Chemistries  Recent Labs  Lab 02/10/18 1558 02/11/18 0504  NA 139 135  K 3.8 3.2*  CL 102 101  CO2 27 25  GLUCOSE 128* 154*  BUN 11 12  CREATININE 0.79 0.82  CALCIUM 9.1 9.1  AST 17  --   ALT 13  --   ALKPHOS 56  --   BILITOT 0.7  --    ------------------------------------------------------------------------------------------------------------------  Cardiac Enzymes Recent Labs  Lab 02/10/18 1558  TROPONINI <0.03   ------------------------------------------------------------------------------------------------------------------  RADIOLOGY:  Dg Cervical Spine Complete  Result Date: 02/10/2018 CLINICAL DATA:  Acute onset of posterior neck pain. EXAM: CERVICAL SPINE - COMPLETE 4+ VIEW COMPARISON:  None. FINDINGS: There is no evidence of fracture. There is mild grade 1 retrolisthesis of C3 on C4, and grade 1 anterolisthesis of C4 on C5, reflecting underlying facet disease. Mild endplate degenerative change is noted along the lower cervical spine, with small anterior and posterior disc osteophyte complexes. Vertebral bodies demonstrate normal height. Prevertebral soft tissues are within normal limits. The provided odontoid view demonstrates no significant abnormality. The visualized lung apices are clear. IMPRESSION: 1. No evidence of fracture or subluxation along the cervical spine. 2. Mild degenerative change along the cervical spine. Electronically Signed   By: Garald Balding M.D.   On: 02/10/2018 23:36   Ct Head Wo Contrast  Result Date: 02/10/2018 CLINICAL DATA:  Speech difficulties. EXAM: CT HEAD WITHOUT CONTRAST TECHNIQUE: Contiguous axial images were obtained from the base of the skull through the vertex without intravenous contrast. COMPARISON:  None. FINDINGS: Brain: Mild chronic ischemic white matter disease is noted. Probable old lacunar infarction seen  in right basal ganglia. No mass effect or midline shift is noted. Ventricular size is within normal limits. There is no evidence of mass lesion, hemorrhage or acute infarction. Vascular: No hyperdense vessel or unexpected calcification. Skull: Normal. Negative for fracture or focal lesion. Sinuses/Orbits: No acute finding. Other: None. IMPRESSION: Mild chronic ischemic white matter disease. No acute intracranial abnormality seen. Electronically Signed   By: Marijo Conception, M.D.   On: 02/10/2018 17:29   Mr Brain Wo Contrast  Result Date: 02/11/2018 CLINICAL DATA:  Initial evaluation for acute speech difficulty, left-sided numbness. EXAM: MRI HEAD WITHOUT CONTRAST TECHNIQUE: Multiplanar, multiecho pulse sequences of the brain and surrounding structures were obtained without intravenous contrast. COMPARISON:  Prior CT from 02/10/2018 FINDINGS: Brain: Generalized age-related cerebral volume loss. Patchy and confluent T2/FLAIR hyperintensity within the periventricular deep white matter both cerebral hemispheres, most consistent with chronic microvascular ischemic disease, moderate to advanced in nature. No abnormal foci of restricted diffusion to suggest acute or subacute ischemia. Gray-white matter differentiation maintained. No areas of remote cortical infarction. No acute intracranial hemorrhage. Multiple scattered chronic micro hemorrhages seen clustered around the deep gray nuclei, pons, and cerebellum, most consistent with chronic poorly controlled hypertension. No mass lesion, midline shift or mass effect. No hydrocephalus. No extra-axial fluid collection. Incidental note made of a partially empty sella. Vascular: Major intracranial vascular flow voids well maintained. Skull and upper cervical spine: Craniocervical junction within normal limits. Degenerative spondylolysis at C5-6 with resultant mild to moderate spinal stenosis. Remainder the visualized upper cervical spine within normal limits. Bone marrow  signal intensity normal. Scalp soft tissues unremarkable. Sinuses/Orbits: Patient status post bilateral ocular lens replacement. Scattered  mucosal thickening throughout the ethmoidal air cells. Paranasal sinuses are otherwise clear. Bilateral mastoid effusions noted, of doubtful significance in the acute setting. Other: None. IMPRESSION: 1. No acute intracranial infarct or other abnormality identified. 2. Age-related cerebral atrophy with moderate to advanced chronic microvascular ischemic disease. 3. Multiple chronic micro hemorrhages centered about the deep gray nuclei, brainstem, and cerebellum. Findings most consistent with sequelae of chronic hypertensive encephalopathy. Electronically Signed   By: Jeannine Boga M.D.   On: 02/11/2018 01:47   US Carotid Bilateral (at [facility] And [facility] Only)  Result Date: 02/11/2018 CLINICAL DATA:  84 year old female with left arm weakness EXAM: BILATERAL CAROTID DUPLEX ULTRASOUND TECHNIQUE: Pearline Cables scale imaging, color Doppler and duplex ultrasound were performed of bilateral carotid and vertebral arteries in the neck. COMPARISON:  Brain MRI obtained earlier today FINDINGS: Criteria: Quantification of carotid stenosis is based on velocity parameters that correlate the residual internal carotid diameter with NASCET-based stenosis levels, using the diameter of the distal internal carotid lumen as the denominator for stenosis measurement. The following velocity measurements were obtained: RIGHT ICA: 86/14 cm/sec CCA: 18/5 cm/sec SYSTOLIC ICA/CCA RATIO:  1.1 ECA:  114 cm/sec LEFT ICA: 97/14 cm/sec CCA: 631/49 cm/sec SYSTOLIC ICA/CCA RATIO:  1.0 ECA:  111 cm/sec RIGHT CAROTID ARTERY: Trace focal atherosclerotic plaque in the origin of the internal carotid artery. By peak systolic velocity criteria, the estimated stenosis remains less than 50%. RIGHT VERTEBRAL ARTERY:  Patent with normal antegrade flow. LEFT CAROTID ARTERY: Trace heterogeneous atherosclerotic plaque in the carotid  bifurcation. Plaque is relatively smooth. By peak systolic velocity criteria, the estimated stenosis remains less than 50%. LEFT VERTEBRAL ARTERY:  Patent with normal antegrade flow. IMPRESSION: 1. Mild (1-49%) stenosis proximal right internal carotid artery secondary to trace focal heterogeneous atherosclerotic plaque. 2. Mild (1-49%) stenosis proximal left internal carotid artery secondary to trace focal heterogeneous but smooth atherosclerotic plaque. 3. Vertebral arteries are patent with normal antegrade flow. Signed, Criselda Peaches, MD, [facility] Electronically Signed   By: Jacqulynn Cadet M.D.   On: 02/11/2018 09:44   Dg Chest Portable 1 View  Result Date: 02/10/2018 CLINICAL DATA:  Back pain.  Hypertension. EXAM: PORTABLE CHEST 1 VIEW COMPARISON:  Radiograph of March 16, 2017. FINDINGS: The heart size and mediastinal contours are within normal limits. Both lungs are clear. No pneumothorax or pleural effusion is noted. The visualized skeletal structures are unremarkable. IMPRESSION: No active disease. Electronically Signed   By: Marijo Conception, M.D.   On: 02/10/2018 18:19     ASSESSMENT AND PLAN:   Active Problems:   Left arm weakness  Transient left arm weakness likely secondary to malignant hypertension, CT head, MRI of the brain did not record stroke.  Ultrasound of carotids are negative for hemodynamically significant stenosis.  Left arm weakness resolved.  #2, neck pain:.  Cervical radiculopathy' patient x-ray of the neck showed degenerative disease, no fractures, started on gabapentin, patient said that it helped her so continue gabapentin while she is here. 3.  Hypertensive emergency, BP in systolic 702O when she came but now borderline BPs, likely secondary to patient getting multiple meds for her blood pressure.  So watch the blood pressure today, start Coreg, lisinopril and in the evening, see how the blood  pressure is, discharge home tomorrow.  Daughter and patient agreeable for this plan. 4.  Anxiety, stress, patient having lots of a lot of social stress due to death of friend, has been being sick, started  on Celexa, discussed with family.   All the records are reviewed and case discussed with Care Management/Social Workerr. Management plans discussed with the patient, family and they are in agreement.  CODE STATUS: Full code  TOTAL TIME TAKING CARE OF THIS PATIENT: 38 min 50% time spent in counseling, coordination of care Discussed with patient daughter extensively.  POSSIBLE D/C IN 1-2 DAYS, DEPENDING ON CLINICAL CONDITION.   Epifanio Lesches M.D on 02/11/2018 at 11:16 AM  Between 7am to 6pm - Pager - [phone]  After 6pm go to [email or web address] - password [email or web address] [facility]  [facility]  Office  [phone]  CC: Primary care physician; Don Broach, MD

## 2018-02-11 NOTE — Care Management Note (Signed)
Case Management Note  Patient Details  Name: Janet Duffy MRN: [id number] Date of Birth: Jul 13, 1931  Subjective/Objective:    Patient admitted to [facility] under observation status for left arm weakness. RNCM consulted on patient to provide MOON letter and complete assessment. Patient is completely independent at baseline and needs no assistance in activities of daily living. Requires no DME. Still drives. Lives at home with her husband. No falls. PCP Zenia Resides. Obtains medications without issue from [facility]                  Action/Plan: PT/OT consults pending.   Expected Discharge Date:                  Expected Discharge Plan:     In-House Referral:     Discharge planning Services     Post Acute Care Choice:    Choice offered to:     DME Arranged:    DME Agency:     HH Arranged:    HH Agency:     Status of Service:     If discussed at [employer], dates discussed:    Additional Comments:  Kathyrn Drown Tamiyah Moulin, RN 02/11/2018, 9:08 AM

## 2018-02-12 ENCOUNTER — Observation Stay
Admit: 2018-02-12 | Discharge: 2018-02-12 | Disposition: A | Discharge status: Home / Self Care | Payer: Medicare HMO | Attending: Internal Medicine | Admitting: Internal Medicine

## 2018-02-12 MED ORDER — ASPIRIN 81 MG PO TBEC: 81.0000 mg | DELAYED_RELEASE_TABLET | Freq: Every day | ORAL | 0 refills | Status: DC

## 2018-02-12 MED ORDER — HYDRALAZINE HCL 50 MG PO TABS: 50.0000 mg | ORAL_TABLET | Freq: Two times a day (BID) | ORAL | 0 refills | Status: DC

## 2018-02-12 MED ORDER — LISINOPRIL 40 MG PO TABS: 40.0000 mg | ORAL_TABLET | Freq: Every day | ORAL | 0 refills | Status: AC

## 2018-02-12 MED ORDER — CITALOPRAM HYDROBROMIDE 10 MG PO TABS: 10.0000 mg | ORAL_TABLET | Freq: Every day | ORAL | 0 refills | Status: DC

## 2018-02-12 MED ORDER — ATORVASTATIN CALCIUM 40 MG PO TABS: 40.0000 mg | ORAL_TABLET | Freq: Every day | ORAL | 0 refills | Status: DC

## 2018-02-12 MED ORDER — GABAPENTIN 600 MG PO TABS: 300.0000 mg | ORAL_TABLET | Freq: Every day | ORAL | 0 refills | Status: DC

## 2018-02-12 NOTE — Progress Notes (Signed)
SLP Cancellation Note  Patient Details Name: Janet Duffy MRN: [id number] DOB: 1932/02/20   Cancelled treatment:       Reason Eval/Treat Not Completed: SLP screened, no needs identified, will sign off(chart reviewed; consulted NSG then met w/ pt/family). Pt denied any difficulty swallowing and is currently on a regular diet; tolerates swallowing pills w/ water per NSG. Pt conversed at conversational level w/out deficits noted; pt and friend denied any speech-language deficits.  No further skilled ST services indicated as pt appears at her baseline. Pt fully agreed. NSG to reconsult if any change in status.     Orinda Kenner, MS, CCC-SLP Santonio Speakman 02/12/2018, 10:13 AM

## 2018-02-12 NOTE — Progress Notes (Signed)
*  PRELIMINARY RESULTS* Echocardiogram 2D Echocardiogram has been performed.  Sherrie Sport 02/12/2018, 8:19 AM

## 2018-02-12 NOTE — Plan of Care (Signed)

## 2018-02-12 NOTE — Progress Notes (Signed)
OT Screen Note  Patient Details Name: Janet Duffy MRN: [id number] DOB: 1931/10/20   Cancelled Treatment:    Reason Eval/Treat Not Completed: OT screened, no needs identified, will sign off. Pt screened; no deficits noted. Pt independent with mobility and ADL tasks. No further skilled OT services needed. Will sign off.   Luna Fuse Mariena Meares OTS    02/12/2018, 9:04 AM

## 2018-02-13 LAB — ECHOCARDIOGRAM COMPLETE
Height: 64 in
Weight: 1992.96 oz

## 2018-02-15 NOTE — Discharge Summary (Signed)
Janet Duffy, is a 84 y.o. female  DOB 27-Jul-1931  MRN [id number].  Admission date:  02/10/2018  Admitting Physician  Sela Hua, MD  Discharge Date:  02/12/2018   Primary MD  Don Broach, MD  Recommendations for primary care physician for things to follow:   Follow-up with PCP in 1 week, patient is advised to have BP machine at home and take blood pressure medicines   Admission Diagnosis  TIA (transient ischemic attack) [G45.9] Hypertension, unspecified type [I10]   Discharge Diagnosis  TIA (transient ischemic attack) [G45.9] Hypertension, unspecified type [I10]    Active Problems:   Left arm weakness      Past Medical History:  Diagnosis Date  . Connective tissue disease ([place])   . DDD (degenerative disc disease), lumbar   . Diverticulitis   . Granuloma annulare   . HTN (hypertension)   . Osteoporosis     History reviewed. No pertinent surgical history.     History of present illness and  Hospital Course:     Kindly see H&P for history of present illness and admission details, please review complete Labs, Consult reports and Test reports for all details in brief  HPI  from the history and physical done on the day of admission 84 year old female patient with history of essential hypertension brought in because of left arm weakness, severely elevated blood pressure.   Hospital Course  Left arm weakness: Transient likely secondary to malignant hypertension, patient MRI of the brain did not show acute stroke, seen by neurology Dr. Irish Elders.  Patient found to have severely elevated blood pressure and required multiple dose of labetalol in the emergency room patient left arm weakness resolved.  Acute stroke is ruled out.  Patient MRI of the brain showed changes related to chronic hypertension. 2.   Malignant hypertension: Improved, received labetalol in the emergency room, patient had hypotension episodes during the hospital stay, because of soft blood pressure I had to decrease the hydralazine dose to 50 mg twice daily, patient is advised to continue Coreg 25 mg p.o. twice daily, lisinopril 40 mg at night.  Advised the patient to have a BP machine at home and check the blood pressure before giving the medicines.  Patient usually is very careful with medicines, and according to daughter patient has been having labile blood pressure issues recently.  #3. anxiety, recent stressful events with loss of a close friend: Patient started on Celexa after discussing with her and her daughter. For hyperlipidemia: Patient is on aspirin, statins.  Advised to continue them.   Discharge Condition: stable   Follow UP  Follow-up Information    Don Broach, MD. Go on 02/15/2018.   Specialty:  Internal Medicine Why:  @8 :40 AM Contact information: [place] [id number] [phone]             Discharge Instructions  and  Discharge Medications     Allergies as of 02/12/2018      Reactions   Clindamycin/lincomycin Anaphylaxis   Codeine Nausea Only   Erythromycin Anaphylaxis   Ivp Dye [iodinated Diagnostic Agents] Anaphylaxis   Metrizamide Anaphylaxis   Monosodium Glutamate Shortness Of Breath, Swelling   Tongue swelling   Moxifloxacin Anaphylaxis   Penicillins Anaphylaxis, Rash   .Has patient had a PCN reaction causing immediate rash, facial/tongue/throat swelling, SOB or lightheadedness with hypotension: Yes Has patient had a PCN reaction causing severe rash involving mucus membranes or skin necrosis: No Has patient had  a PCN reaction that required hospitalization: Unknown Has patient had a PCN reaction occurring within the last 10 years: Yes If all of the above answers are "NO", then may proceed with Cephalosporin use.   Tramadol    Patient  discloses she "went crazy"   Amlodipine Swelling   Avelox [moxifloxacin Hcl In Nacl]    Shellfish Allergy Hives   seafood   Zoster Vaccine Live Rash      Medication List    STOP taking these medications   nitroGLYCERIN 2 % ointment Commonly known as:  NITROGLYN   ondansetron 4 MG tablet Commonly known as:  ZOFRAN   traMADol 50 MG tablet Commonly known as:  ULTRAM     TAKE these medications   acetaminophen 325 MG tablet Commonly known as:  TYLENOL Take 2 tablets (650 mg total) by mouth every 6 (six) hours as needed for mild pain (or Fever >/= 101).   aspirin 81 MG EC tablet Take 1 tablet (81 mg total) by mouth daily.   atorvastatin 40 MG tablet Commonly known as:  LIPITOR Take 1 tablet (40 mg total) by mouth daily at 6 PM.   BENEFIBER Powd Take 1 Dose by mouth daily.   carvedilol 25 MG tablet Commonly known as:  COREG Take 25 mg by mouth 2 (two) times daily.   citalopram 10 MG tablet Commonly known as:  CELEXA Take 1 tablet (10 mg total) by mouth daily.   gabapentin 600 MG tablet Commonly known as:  NEURONTIN Take 0.5 tablets (300 mg total) by mouth at bedtime.   hydrALAZINE 50 MG tablet Commonly known as:  APRESOLINE Take 1 tablet (50 mg total) by mouth 2 (two) times daily. What changed:    how much to take  when to take this  additional instructions   hydrocortisone valerate ointment 0.2 % Commonly known as:  WEST-CORT Apply 1 application topically 2 (two) times daily as needed (skin irritation).   lisinopril 40 MG tablet Commonly known as:  PRINIVIL,ZESTRIL Take 1 tablet (40 mg total) by mouth daily. What changed:    medication strength  how much to take   ondansetron 4 MG disintegrating tablet Commonly known as:  ZOFRAN-ODT Take 1 tablet (4 mg total) by mouth every 8 (eight) hours as needed for nausea or vomiting.   senna 8.6 MG Tabs tablet Commonly known as:  SENOKOT Take 1 tablet (8.6 mg total) by mouth daily.   triamcinolone cream  0.1 % Commonly known as:  KENALOG Apply 1 application topically 2 (two) times daily as needed (itching).   Vitamin D3 25 MCG (1000 UT) Caps Take 1,000 Units by mouth daily.         Diet and Activity recommendation: See Discharge Instructions above   Consults obtained -neurology   Major procedures and Radiology Reports - PLEASE review detailed and final reports for all details, in brief -      Dg Cervical Spine Complete  Result Date: 02/10/2018 CLINICAL DATA:  Acute onset of posterior neck pain. EXAM: CERVICAL SPINE - COMPLETE 4+ VIEW COMPARISON:  None. FINDINGS: There is no evidence of fracture. There is mild grade 1 retrolisthesis of C3 on C4, and grade 1 anterolisthesis of C4 on C5, reflecting underlying facet disease. Mild endplate degenerative change is noted along the lower cervical spine, with small anterior and posterior disc osteophyte complexes. Vertebral bodies demonstrate normal height. Prevertebral soft tissues are within normal limits. The provided odontoid view demonstrates no significant abnormality. The visualized lung apices are clear.  IMPRESSION: 1. No evidence of fracture or subluxation along the cervical spine. 2. Mild degenerative change along the cervical spine. Electronically Signed   By: Garald Balding M.D.   On: 02/10/2018 23:36   Ct Head Wo Contrast  Result Date: 02/10/2018 CLINICAL DATA:  Speech difficulties. EXAM: CT HEAD WITHOUT CONTRAST TECHNIQUE: Contiguous axial images were obtained from the base of the skull through the vertex without intravenous contrast. COMPARISON:  None. FINDINGS: Brain: Mild chronic ischemic white matter disease is noted. Probable old lacunar infarction seen in right basal ganglia. No mass effect or midline shift is noted. Ventricular size is within normal limits. There is no evidence of mass lesion, hemorrhage or acute infarction. Vascular: No hyperdense vessel or unexpected calcification. Skull: Normal. Negative for fracture or  focal lesion. Sinuses/Orbits: No acute finding. Other: None. IMPRESSION: Mild chronic ischemic white matter disease. No acute intracranial abnormality seen. Electronically Signed   By: Marijo Conception, M.D.   On: 02/10/2018 17:29   Mr Brain Wo Contrast  Result Date: 02/11/2018 CLINICAL DATA:  Initial evaluation for acute speech difficulty, left-sided numbness. EXAM: MRI HEAD WITHOUT CONTRAST TECHNIQUE: Multiplanar, multiecho pulse sequences of the brain and surrounding structures were obtained without intravenous contrast. COMPARISON:  Prior CT from 02/10/2018 FINDINGS: Brain: Generalized age-related cerebral volume loss. Patchy and confluent T2/FLAIR hyperintensity within the periventricular deep white matter both cerebral hemispheres, most consistent with chronic microvascular ischemic disease, moderate to advanced in nature. No abnormal foci of restricted diffusion to suggest acute or subacute ischemia. Gray-white matter differentiation maintained. No areas of remote cortical infarction. No acute intracranial hemorrhage. Multiple scattered chronic micro hemorrhages seen clustered around the deep gray nuclei, pons, and cerebellum, most consistent with chronic poorly controlled hypertension. No mass lesion, midline shift or mass effect. No hydrocephalus. No extra-axial fluid collection. Incidental note made of a partially empty sella. Vascular: Major intracranial vascular flow voids well maintained. Skull and upper cervical spine: Craniocervical junction within normal limits. Degenerative spondylolysis at C5-6 with resultant mild to moderate spinal stenosis. Remainder the visualized upper cervical spine within normal limits. Bone marrow signal intensity normal. Scalp soft tissues unremarkable. Sinuses/Orbits: Patient status post bilateral ocular lens replacement. Scattered mucosal thickening throughout the ethmoidal air cells. Paranasal sinuses are otherwise clear. Bilateral mastoid effusions noted, of  doubtful significance in the acute setting. Other: None. IMPRESSION: 1. No acute intracranial infarct or other abnormality identified. 2. Age-related cerebral atrophy with moderate to advanced chronic microvascular ischemic disease. 3. Multiple chronic micro hemorrhages centered about the deep gray nuclei, brainstem, and cerebellum. Findings most consistent with sequelae of chronic hypertensive encephalopathy. Electronically Signed   By: Jeannine Boga M.D.   On: 02/11/2018 01:47   US Carotid Bilateral (at [facility] And [facility] Only)  Result Date: 02/11/2018 CLINICAL DATA:  84 year old female with left arm weakness EXAM: BILATERAL CAROTID DUPLEX ULTRASOUND TECHNIQUE: Pearline Cables scale imaging, color Doppler and duplex ultrasound were performed of bilateral carotid and vertebral arteries in the neck. COMPARISON:  Brain MRI obtained earlier today FINDINGS: Criteria: Quantification of carotid stenosis is based on velocity parameters that correlate the residual internal carotid diameter with NASCET-based stenosis levels, using the diameter of the distal internal carotid lumen as the denominator for stenosis measurement. The following velocity measurements were obtained: RIGHT ICA: 86/14 cm/sec CCA: 40/9 cm/sec SYSTOLIC ICA/CCA RATIO:  1.1 ECA:  114 cm/sec LEFT ICA: 97/14 cm/sec CCA: 811/91 cm/sec SYSTOLIC ICA/CCA RATIO:  1.0 ECA:  111 cm/sec RIGHT CAROTID ARTERY: Trace focal atherosclerotic plaque in the origin  of the internal carotid artery. By peak systolic velocity criteria, the estimated stenosis remains less than 50%. RIGHT VERTEBRAL ARTERY:  Patent with normal antegrade flow. LEFT CAROTID ARTERY: Trace heterogeneous atherosclerotic plaque in the carotid bifurcation. Plaque is relatively smooth. By peak systolic velocity criteria, the estimated stenosis remains less than 50%. LEFT VERTEBRAL ARTERY:  Patent with normal antegrade flow. IMPRESSION: 1. Mild (1-49%) stenosis proximal right internal carotid artery secondary to  trace focal heterogeneous atherosclerotic plaque. 2. Mild (1-49%) stenosis proximal left internal carotid artery secondary to trace focal heterogeneous but smooth atherosclerotic plaque. 3. Vertebral arteries are patent with normal antegrade flow. Signed, Criselda Peaches, MD, [facility] Electronically Signed   By: Jacqulynn Cadet M.D.   On: 02/11/2018 09:44   Dg Chest Portable 1 View  Result Date: 02/10/2018 CLINICAL DATA:  Back pain.  Hypertension. EXAM: PORTABLE CHEST 1 VIEW COMPARISON:  Radiograph of March 16, 2017. FINDINGS: The heart size and mediastinal contours are within normal limits. Both lungs are clear. No pneumothorax or pleural effusion is noted. The visualized skeletal structures are unremarkable. IMPRESSION: No active disease. Electronically Signed   By: Marijo Conception, M.D.   On: 02/10/2018 18:19    Micro Results     No results found for this or any previous visit (from the past 240 hour(s)).     Today   Subjective:   Janet Duffy today has no headache,no chest abdominal pain,no new weakness tingling or numbness, feels much better wants to go home today.   Objective:   Blood pressure (!) 150/70, pulse 72, temperature 98.2 F (36.8 C), temperature source Oral, resp. rate 18, height 5\' 4"  (1.626 m), weight 56.5 kg, SpO2 96 %.  No intake or output data in the 24 hours ending 02/15/18 1652  Exam Awake Alert, Oriented x 3, No new F.N deficits, Normal affect [place].AT,PERRAL Supple Neck,No JVD, No cervical lymphadenopathy appriciated.  Symmetrical Chest wall movement, Good air movement bilaterally, CTAB RRR,No Gallops,Rubs or new Murmurs, No Parasternal Heave +ve B.Sounds, Abd Soft, Non tender, No organomegaly appriciated, No rebound -guarding or rigidity. No Cyanosis, Clubbing or edema, No new Rash or bruise  Data Review   CBC w Diff:  Lab Results  Component Value Date   WBC 3.9 (L)  02/11/2018   HGB 10.8 (L) 02/11/2018   HCT 32.8 (L) 02/11/2018   PLT 177 02/11/2018   LYMPHOPCT 36 02/10/2018   MONOPCT 11 02/10/2018   EOSPCT 2 02/10/2018   BASOPCT 1 02/10/2018    CMP:  Lab Results  Component Value Date   NA 135 02/11/2018   K 3.2 (L) 02/11/2018   CL 101 02/11/2018   CO2 25 02/11/2018   BUN 12 02/11/2018   CREATININE 0.82 02/11/2018   PROT 9.1 (H) 02/10/2018   ALBUMIN 3.4 (L) 02/10/2018   BILITOT 0.7 02/10/2018   ALKPHOS 56 02/10/2018   AST 17 02/10/2018   ALT 13 02/10/2018  .   Total Time in preparing paper work, data evaluation and todays exam - 35 minutes  Epifanio Lesches M.D on 02/12/2018 at 4:52 PM    Note: This dictation was prepared with Dragon dictation along with smaller phrase technology. Any transcriptional errors that result from this process are unintentional.

## 2018-04-06 ENCOUNTER — Emergency Department
Admission: EM | Admit: 2018-04-06 | Discharge: 2018-04-07 | Disposition: A | Discharge status: Home / Self Care | Payer: Medicare HMO | Attending: Emergency Medicine | Admitting: Emergency Medicine

## 2018-04-06 ENCOUNTER — Emergency Department: Payer: Medicare HMO

## 2018-04-06 ENCOUNTER — Other Ambulatory Visit: Payer: Self-pay

## 2018-04-06 DIAGNOSIS — Z7982 Long term (current) use of aspirin: Secondary | ICD-10-CM | POA: Insufficient documentation

## 2018-04-06 DIAGNOSIS — J189 Pneumonia, unspecified organism: Secondary | ICD-10-CM | POA: Insufficient documentation

## 2018-04-06 DIAGNOSIS — I10 Essential (primary) hypertension: Secondary | ICD-10-CM | POA: Diagnosis not present

## 2018-04-06 DIAGNOSIS — Z79899 Other long term (current) drug therapy: Secondary | ICD-10-CM | POA: Diagnosis not present

## 2018-04-06 DIAGNOSIS — R0602 Shortness of breath: Secondary | ICD-10-CM | POA: Diagnosis present

## 2018-04-06 LAB — COMPREHENSIVE METABOLIC PANEL
ALT: 21 U/L (ref 0–44)
AST: 24 U/L (ref 15–41)
Albumin: 2.8 g/dL — ABNORMAL LOW (ref 3.5–5.0)
Alkaline Phosphatase: 67 U/L (ref 38–126)
Anion gap: 9 (ref 5–15)
BUN: 8 mg/dL (ref 8–23)
CALCIUM: 8.6 mg/dL — AB (ref 8.9–10.3)
CHLORIDE: 99 mmol/L (ref 98–111)
CO2: 25 mmol/L (ref 22–32)
CREATININE: 0.64 mg/dL (ref 0.44–1.00)
GFR calc Af Amer: >60 mL/min (ref >60)
Glucose, Bld: 120 mg/dL — ABNORMAL HIGH (ref 70–99)
Potassium: 3.5 mmol/L (ref 3.5–5.1)
Sodium: 133 mmol/L — ABNORMAL LOW (ref 135–145)
TOTAL PROTEIN: 7.9 g/dL (ref 6.5–8.1)
Total Bilirubin: 0.5 mg/dL (ref 0.3–1.2)

## 2018-04-06 LAB — URINALYSIS, COMPLETE (UACMP) WITH MICROSCOPIC
Bilirubin Urine: NEGATIVE
GLUCOSE, UA: NEGATIVE mg/dL
HGB URINE DIPSTICK: NEGATIVE
Ketones, ur: NEGATIVE mg/dL
NITRITE: NEGATIVE
PH: 7 (ref 5.0–8.0)
Protein, ur: NEGATIVE mg/dL
SPECIFIC GRAVITY, URINE: 1.008 (ref 1.005–1.030)

## 2018-04-06 LAB — CBC
HCT: 32.7 % — ABNORMAL LOW (ref 36.0–46.0)
Hemoglobin: 10.9 g/dL — ABNORMAL LOW (ref 12.0–15.0)
MCH: 29.5 pg (ref 26.0–34.0)
MCHC: 33.3 g/dL (ref 30.0–36.0)
MCV: 88.6 fL (ref 80.0–100.0)
PLATELETS: 218 10*3/uL (ref 150–400)
RBC: 3.69 MIL/uL — AB (ref 3.87–5.11)
RDW: 15.4 % (ref 11.5–15.5)
WBC: 7.6 10*3/uL (ref 4.0–10.5)
nRBC: 0 % (ref 0.0–0.2)

## 2018-04-06 LAB — INFLUENZA PANEL BY PCR (TYPE A & B)
Influenza A By PCR: NEGATIVE
Influenza B By PCR: NEGATIVE

## 2018-04-06 LAB — CG4 I-STAT (LACTIC ACID): Lactic Acid, Venous: 0.77 mmol/L (ref 0.5–1.9)

## 2018-04-06 MED ORDER — ONDANSETRON 4 MG PO TBDP
4.0000 mg | ORAL_TABLET | Freq: Once | ORAL | Status: AC
  Administered 2018-04-06: 4 mg via ORAL
  Filled 2018-04-06: qty 1

## 2018-04-06 NOTE — ED Triage Notes (Signed)
Pt in with co fever, states tested positive for the flu on Monday. Also co headache.

## 2018-04-07 ENCOUNTER — Emergency Department: Payer: Medicare HMO

## 2018-04-07 LAB — TROPONIN I

## 2018-04-07 LAB — BRAIN NATRIURETIC PEPTIDE: B NATRIURETIC PEPTIDE 5: 155 pg/mL — AB (ref 0.0–100.0)

## 2018-04-07 LAB — PROCALCITONIN

## 2018-04-07 MED ORDER — DOXYCYCLINE HYCLATE 100 MG PO TABS
100.0000 mg | ORAL_TABLET | Freq: Once | ORAL | Status: AC
  Administered 2018-04-07: 100 mg via ORAL
  Filled 2018-04-07: qty 1

## 2018-04-07 MED ORDER — SODIUM CHLORIDE 0.9 % IV BOLUS
500.0000 mL | Freq: Once | INTRAVENOUS | Status: AC
  Administered 2018-04-07: 500 mL via INTRAVENOUS

## 2018-04-07 MED ORDER — DOXYCYCLINE HYCLATE 100 MG PO CAPS: ORAL_CAPSULE | ORAL | 0 refills | Status: DC

## 2018-04-07 MED ORDER — ONDANSETRON HCL 4 MG/2ML IJ SOLN
4.0000 mg | INTRAMUSCULAR | Status: AC
  Administered 2018-04-07: 4 mg via INTRAVENOUS
  Filled 2018-04-07: qty 2

## 2018-04-07 NOTE — ED Notes (Signed)
Patient ambulated down hall, oxygen saturations 94%, HR 84. Patient ambulated with steady gait.

## 2018-04-07 NOTE — ED Notes (Signed)
Pt via family member c/o throat feeling "hot and raw", pt no acute breathing difficulties noted, Dr Karma Greaser notified,

## 2018-04-07 NOTE — ED Provider Notes (Signed)
[facility] Emergency Department Provider Note  ____________________________________________   First MD Initiated Contact with Patient 04/06/18 2342     (approximate)  I have reviewed the triage vital signs and the nursing notes.   HISTORY  Chief Complaint Fever    HPI Janet Duffy is a 85 y.o. female with medical history as listed below who presents for evaluation of worsening shortness of breath, cough, and fever.  She reports that she was seen 5 days ago at an urgent care after having symptoms for about 10 days total that include cough, shortness of breath, fever, nasal congestion, general malaise, and generalized myalgias.  She had a nasal swab and was diagnosed with influenza.  She just completed a course of Tamiflu today but her symptoms have not gotten any better.  In fact she reports that her symptoms are gotten worse particular over the last 2 days.  She is so weak she has a difficult time getting up and walking around.  Her cough is severe and makes her short of breath.  Trying to eat or drink or speak make the cough and the shortness of breath worse.  She has had a fever to 101 at home and she took Tylenol before coming in which likely brought the temperature down to 99.1 in triage.  She and her husband report the symptoms are severe and nothing in particular is making her better.  She denies chest pain, abdominal pain, and dysuria.  She had an influenza vaccination this year.  Past Medical History:  Diagnosis Date  . Connective tissue disease ([place])   . DDD (degenerative disc disease), lumbar   . Diverticulitis   . Granuloma annulare   . HTN (hypertension)   . Osteoporosis     Patient Active Problem List   Diagnosis Date Noted  . Left arm weakness 02/10/2018  . Acute diverticulitis 06/18/2016    No past surgical history on file.  Prior to Admission medications   Medication Sig Start Date End Date Taking? Authorizing Provider  acetaminophen  (TYLENOL) 325 MG tablet Take 2 tablets (650 mg total) by mouth every 6 (six) hours as needed for mild pain (or Fever >/= 101). 09/21/16   Dustin Flock, MD  aspirin EC 81 MG EC tablet Take 1 tablet (81 mg total) by mouth daily. 02/13/18   Epifanio Lesches, MD  atorvastatin (LIPITOR) 40 MG tablet Take 1 tablet (40 mg total) by mouth daily at 6 PM. 02/12/18   Epifanio Lesches, MD  carvedilol (COREG) 25 MG tablet Take 25 mg by mouth 2 (two) times daily. 05/18/16   [provider]  Cholecalciferol (VITAMIN D3) 1000 units CAPS Take 1,000 Units by mouth daily.    [provider]  citalopram (CELEXA) 10 MG tablet Take 1 tablet (10 mg total) by mouth daily. 02/13/18   Epifanio Lesches, MD  doxycycline (VIBRAMYCIN) 100 MG capsule Take 1 capsule (100 mg) by mouth twice daily for 10 days. 04/07/18   Hinda Kehr, MD  gabapentin (NEURONTIN) 600 MG tablet Take 0.5 tablets (300 mg total) by mouth at bedtime. 02/12/18   Epifanio Lesches, MD  hydrALAZINE (APRESOLINE) 50 MG tablet Take 1 tablet (50 mg total) by mouth 2 (two) times daily. 02/12/18   Epifanio Lesches, MD  hydrocortisone valerate ointment (WEST-CORT) 0.2 % Apply 1 application topically 2 (two) times daily as needed (skin irritation).    [provider]  lisinopril (PRINIVIL,ZESTRIL) 40 MG tablet Take 1 tablet (40 mg total) by mouth daily. 02/13/18  Epifanio Lesches, MD  ondansetron (ZOFRAN ODT) 4 MG disintegrating tablet Take 1 tablet (4 mg total) by mouth every 8 (eight) hours as needed for nausea or vomiting. 07/21/16   Rudene Re, MD  senna (SENOKOT) 8.6 MG TABS tablet Take 1 tablet (8.6 mg total) by mouth daily. 07/21/16   Rudene Re, MD  triamcinolone cream (KENALOG) 0.1 % Apply 1 application topically 2 (two) times daily as needed (itching).    [provider]  Wheat Dextrin (BENEFIBER) POWD Take 1 Dose by mouth daily.    [provider]     Allergies Clindamycin/lincomycin; Codeine; Erythromycin; Ivp dye [iodinated diagnostic agents]; Metrizamide; Monosodium glutamate; Moxifloxacin; Penicillins; Tramadol; Amlodipine; Avelox [moxifloxacin hcl in nacl]; Shellfish allergy; and Zoster vaccine live  No family history on file.  Social History Social History   Tobacco Use  . Smoking status: Never Smoker  . Smokeless tobacco: Never Used  Substance Use Topics  . Alcohol use: No  . Drug use: No    Review of Systems Constitutional: Fever with a T-max of 101 at home and chills.  General malaise and myalgias. Eyes: No visual changes. ENT: No sore throat. Cardiovascular: Denies chest pain. Respiratory: Shortness of breath and worsening cough. Gastrointestinal: No abdominal pain.  Nausea and vomiting with decreased oral intake.  No diarrhea.  No constipation. Genitourinary: Negative for dysuria. Musculoskeletal: Negative for neck pain.  Negative for back pain. Integumentary: Negative for rash. Neurological: Negative for headaches, focal weakness or numbness.   ____________________________________________   PHYSICAL EXAM:  VITAL SIGNS: ED Triage Vitals [04/06/18 2216]  Enc Vitals Group     BP (!) 156/68     Pulse Rate 98     Resp 20     Temp 99.1 F (37.3 C)     Temp Source Oral     SpO2 95 %     Weight 56.7 kg (125 lb)     Height      Head Circumference      Peak Flow      Pain Score 7     Pain Loc      Pain Edu?      Excl. in [place]?     Constitutional: Alert and oriented.  The patient is ill-appearing although not toxic in appearance. Eyes: Conjunctivae are normal.  Head: Atraumatic. Nose: No congestion/rhinnorhea. Mouth/Throat: Mucous membranes are dry.  Oropharynx is nonerythematous. Neck: No stridor.  No meningeal signs.   Cardiovascular: Borderline tachycardia, regular rhythm. Good peripheral circulation. Grossly normal heart sounds. Respiratory: Slightly increased respiratory effort.  No  retractions.  Coarse breath sounds diffusely throughout.  Frequent thick sounding cough. Gastrointestinal: Soft and nontender. No distention.  Musculoskeletal: No lower extremity tenderness nor edema. No gross deformities of extremities. Neurologic:  Normal speech and language. No gross focal neurologic deficits are appreciated.  Skin:  Skin is warm, dry and intact. No rash noted.   ____________________________________________   LABS (all labs ordered are listed, but only abnormal results are displayed)  Labs Reviewed  CBC - Abnormal; Notable for the following components:      Result Value   RBC 3.69 (*)    Hemoglobin 10.9 (*)    HCT 32.7 (*)    All other components within normal limits  COMPREHENSIVE METABOLIC PANEL - Abnormal; Notable for the following components:   Sodium 133 (*)    Glucose, Bld 120 (*)    Calcium 8.6 (*)    Albumin 2.8 (*)    All other components within normal  limits  URINALYSIS, COMPLETE (UACMP) WITH MICROSCOPIC - Abnormal; Notable for the following components:   Color, Urine YELLOW (*)    APPearance CLEAR (*)    Leukocytes, UA SMALL (*)    Bacteria, UA RARE (*)    All other components within normal limits  BRAIN NATRIURETIC PEPTIDE - Abnormal; Notable for the following components:   B Natriuretic Peptide 155.0 (*)    All other components within normal limits  URINE CULTURE  CULTURE, BLOOD (ROUTINE X 2)  CULTURE, BLOOD (ROUTINE X 2)  INFLUENZA PANEL BY PCR (TYPE A & B)  PROCALCITONIN  TROPONIN I  CG4 I-STAT (LACTIC ACID)   ____________________________________________  EKG  ED ECG REPORT I, Hinda Kehr, the attending physician, personally viewed and interpreted this ECG.  Date: 04/07/2018 EKG Time: 00: 53 Rate: 84 Rhythm: normal sinus rhythm QRS Axis: normal Intervals: normal ST/T Wave abnormalities: normal Narrative Interpretation: no evidence of acute ischemia  ____________________________________________  RADIOLOGY I, Hinda Kehr,  personally viewed and evaluated these images (plain radiographs) as part of my medical decision making, as well as reviewing the written report by the radiologist.  ED MD interpretation: Probable community-acquired pneumonia base of left lung  Official radiology report(s): Dg Chest 2 View  Result Date: 04/06/2018 CLINICAL DATA:  Fever and cough EXAM: CHEST - 2 VIEW COMPARISON:  02/10/2018 FINDINGS: Hyperinflation with diffuse interstitial opacity. Patchy focal opacity at the left base. No pleural effusion. Normal heart size. Aortic atherosclerosis. No pneumothorax. IMPRESSION: 1. Patchy opacity at the left base may reflect atelectasis or mild pneumonia 2. Mild diffuse interstitial opacity suspect for acute interstitial inflammatory process. Electronically Signed   By: Donavan Foil M.D.   On: 04/06/2018 23:34   Ct Chest Wo Contrast  Result Date: 04/07/2018 CLINICAL DATA:  Acute onset of fever, cough and congestion. Headache. EXAM: CT CHEST WITHOUT CONTRAST TECHNIQUE: Multidetector CT imaging of the chest was performed following the standard protocol without IV contrast. COMPARISON:  Chest radiograph performed 04/06/2018 FINDINGS: Cardiovascular: The heart is borderline normal in size. Diffuse coronary artery calcifications are seen. Prominence of the main pulmonary arteries raises concern for pulmonary arterial hypertension. Diffuse calcification is seen along the thoracic aorta and proximal great vessels Mediastinum/Nodes: Evaluation for hilar lymphadenopathy is suboptimal without contrast, but there is suspicion for bilateral hilar lymphadenopathy, particularly on the right, of uncertain significance. No definite mediastinal lymphadenopathy is seen. No pericardial effusion is identified. The thyroid gland is unremarkable. No axillary lymphadenopathy is appreciated. Lungs/Pleura: Patchy left-sided airspace opacity is concerning for pneumonia, possibly atypical in nature. Mild right basilar atelectasis is  noted. No pleural effusion or pneumothorax is seen. No dominant mass is identified. Upper Abdomen: The visualized portions of the liver and spleen are unremarkable. Mild calcification about the pancreas may reflect sequelae of chronic pancreatitis. The visualized portions of the adrenal glands and kidneys are grossly unremarkable. Musculoskeletal: No acute osseous abnormalities are identified. The visualized musculature is unremarkable in appearance. IMPRESSION: 1. Patchy left-sided airspace opacity is concerning for pneumonia, possibly atypical in nature. Mild right basilar atelectasis noted. 2. Prominence of the main pulmonary arteries raises concern for pulmonary arterial hypertension. 3. Suggestion of bilateral hilar lymphadenopathy, particularly on the right, of uncertain significance. Would correlate for underlying systemic condition. 4. Diffuse coronary artery calcifications. 5. Mild calcification about the pancreas may reflect sequelae of chronic pancreatitis. Electronically Signed   By: Garald Balding M.D.   On: 04/07/2018 00:40    ____________________________________________   PROCEDURES  Critical Care performed: No  Procedure(s) performed:   Procedures   ____________________________________________   INITIAL IMPRESSION / ASSESSMENT AND PLAN / ED COURSE  As part of my medical decision making, I reviewed the following data within the electronic MEDICAL RECORD NUMBER History obtained from family, Nursing notes reviewed and incorporated, Labs reviewed , EKG interpreted , Old chart reviewed, Radiograph reviewed  and Notes from prior ED visits    Differential diagnosis includes, but is not limited to, superimposed community-acquired pneumonia on top of recent influenza, sepsis, urinary tract infection, nonspecific viral illness, bacteremia.  The patient's vital signs are generally reassuring at this point and somewhat surprisingly her lab work is relatively reassuring as well.  She has no  leukocytosis and has a normal lactic acid.  Her influenza studies today are negative but this likely reflects the fact she has had symptoms already for a week and a half and just completed Tamiflu.  However she does look more uncomfortable than her labs would lead one to believe.  Her chest x-ray does suggest pneumonia and given her clinical appearance, I am going to obtain a CT chest without contrast to evaluate the extent of the infection.  I am giving her 500 mL normal saline IV bolus, Zofran 4 mg IV (the ODT Zofran 4 mg did not help), we will check blood cultures and a procalcitonin as well.  I am also adding on a troponin and a BNP to essentially complete a sepsis work-up.  EKG is pending as well.  Patient agrees with the plan.  Clinical Course as of Apr 07 342  Sat Apr 07, 2018  0228 The rest of the work-up has been reassuring.  Her procalcitonin was negative, troponin was negative, blood cultures are pending, no leukocytosis, relatively normal conference of metabolic panel.  Questionable mild UTI and I have ordered a urine culture as well.  Also, we ambulated the patient and she never dropped below 94% with ambulation which is what she is at at baseline resting in bed.I explained to the patient that her work-up was reassuring except for the presence of pneumonia.  I verified with her that she has extensive allergies and she claims that she is anaphylactic to all of them and that she has had life-threatening reactions including airway compromise and skin rash.  She has no memory of ever having a reaction to doxycycline so I will give her a dose of doxycycline here and a 10-day course to take as an outpatient.  I encouraged close follow-up with her primary care doctor at [facility] and I gave her strict return precautions to come back to the emergency department if her symptoms worsen over the weekend.  She and her husband understand and agree with the plan.   [CF]    Clinical Course User Index [CF]  Hinda Kehr, MD    ____________________________________________  FINAL CLINICAL IMPRESSION(S) / ED DIAGNOSES  Final diagnoses:  Community acquired pneumonia, unspecified laterality     MEDICATIONS GIVEN DURING THIS VISIT:  Medications  ondansetron (ZOFRAN-ODT) disintegrating tablet 4 mg (4 mg Oral Given 04/06/18 2355)  sodium chloride 0.9 % bolus 500 mL (0 mLs Intravenous Stopped 04/07/18 0154)  ondansetron (ZOFRAN) injection 4 mg (4 mg Intravenous Given 04/07/18 0111)  doxycycline (VIBRA-TABS) tablet 100 mg (100 mg Oral Given 04/07/18 0238)     ED Discharge Orders         Ordered    doxycycline (VIBRAMYCIN) 100 MG capsule     04/07/18 0230  Note:  This document was prepared using Dragon voice recognition software and may include unintentional dictation errors.    Hinda Kehr, MD 04/07/18 [phone]

## 2018-04-07 NOTE — Discharge Instructions (Signed)
We believe that your symptoms are caused today by pneumonia, an infection in your lung(s).  Fortunately you should start to improve quickly after taking your antibiotics.  Please take the full course of antibiotics as prescribed and drink plenty of fluids.  Continue to take your regular medications as well.  Follow up with your doctor within 1-2 days.  If you develop any new or worsening symptoms, including but not limited to fever in spite of taking over-the-counter ibuprofen and/or Tylenol, persistent vomiting, worsening shortness of breath, or other symptoms that concern you, please return to the Emergency Department immediately.

## 2018-04-07 NOTE — ED Notes (Addendum)
Pt continuing to swallow ice, spilled ice water on floor, this RN drying floor

## 2018-04-07 NOTE — ED Notes (Signed)
Peripheral IV discontinued. Catheter intact. No signs of infiltration or redness. Gauze applied to IV site.   Discharge instructions reviewed with patient. Questions fielded by this RN. Patient verbalizes understanding of instructions. Patient discharged home in stable condition per [place]bach. No acute distress noted at time of discharge.    

## 2018-04-08 LAB — URINE CULTURE
Culture: NO GROWTH
Special Requests: NORMAL

## 2018-04-08 NOTE — ED Notes (Signed)
Pt assessed for allergic reaction, given option of further observation in ED or discharge, pt and husband elect DC, Dr Karma Greaser notified

## 2018-04-12 LAB — CULTURE, BLOOD (ROUTINE X 2)
CULTURE: NO GROWTH
Culture: NO GROWTH
Special Requests: ADEQUATE

## 2018-10-25 NOTE — Progress Notes (Signed)
[facility]  [address], [address] [place], [place] [address] Phone: [phone]  Fax: [phone]   Clinic Day:  10/26/2018  Referring physician: Barbaraann Boys, MD  Chief Complaint: Janet Duffy is a 85 y.o. female with iron deficiency anemia, leukopenia, and a monoclonal gammopathy who is referred in consultation by Dr. Janene Harvey for assessment and management.   HPI:  The patient has a history of mild anemia. Neutropenia and anemia were discovered on routine blood work done on 09/24/2018.  She was advised to eat an iron rich diet and begin oral iron with vitamin C.   She has a history of mild anemia. Colonoscopy on 09/29/2015 showed acute erosive colitis. Gastroenterology has not repeated the colonoscopy due to her history of colitis and risk associated with the procedure due to aortic insufficiency and intermittent atrial fibrillation. She has a history of diverticulitis and has LLQ pain with flare-ups. She underwent a blood transfusion 4 years ago following a GI bleed.    She has a history of undifferentiated connective tissue disease.  She was followed by Dr. Almeta Monas at [facility] rheumatology, and is now under the care of Dr. Thelma Comp. She has a history of digital vascular insufficiency, embolus by arteriogram, positive rheumatoid factor (1:1280), elevated TPA inhibitor, flare of symmetrical polyarthritis 2002, elevated sed rate/CRP, monoclonal IgA lambda, and episodes of morning stiffness, and polyarthralgias improved with steroids. She denies undergoing any other treatment. She has a history of Raynaud's.   He has had an elevated IgA since 2007.  IgA has been followed: 2170 on 01/23/2006, 2340 on 03/02/2006 and 2470 on 10/14/2015.  She has an IgA lambda monoclonal protein.  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    M spike has been followed (gm/dL): 1.57 on 05/11/2015, 1.56 on  03/02/2006, 1.52 on 03/08/2007, 1.59 on 04/10/2008, 1.91 on 04/16/2009, 1.20 on 07/19/2012, 1.36 on 01/17/2013, 1.59 on 10/07/2014, 1.65 on 08/13/2015, and 2.15 on 10/04/2018.  Kappa free light chains were 1.56, lambda free light chains 1.98, and ratio 0.79 on 10/14/2015  WBC has been in the range of 3,900 - 5,600 from 2007-2020.  Most recently, WBC was 4,000 on 02/10/2018, 3,900 on 02/11/2018, 7,600 on 04/06/2018, and 2,700 on 09/24/2018.   Ferritin has been followed: 38 on 04/16/2009, 11 on 05/13/2010, 3 on 10/07/2014, and 8 on 09/24/2018.   Labs on 09/24/2018: hematocrit 35.9, hemoglobin 12.0, MCV 90, platelets 152,000, WBC 2,700 (ANC 1,400).  Differential included 53% segs, 29% lymphs, 12% monocytes and 5% eosinophils.  Creatinine 0.8.  Calcium 9.3.  Protein 8.8 (high) and albumin 2.9 (low).  Ferritin was 8 with an iron saturation 33% and a TIBC of 233.  TSH was 2.91.  HIV, hepatitis B (surface antigen, core antibody total, surface antibody) and hepatitis C were negative on 10/14/2015.  Symptomatically, she is doing well. She denies any blood in her stools, black stools, or vaginal bleeding. She eats mostly fruits and vegetables. She eats meat occasionally, but has been trying to incorporate more red meat. She denies any ice pica or restless leg. She has been on oral iron for the past few weeks. She denies constipation.   She had the flu and pneumonia in 04/2018 but was not hospitalized. She denies any infections, fevers, sweats, or weight loss. She denies any dental issues. She denies any cough, shortness of breath, or chest pain. She reports regular BMs and denies any nausea,  vomiting, or diarrhea. She notes her heart occasionally goes out of rhythm.  She has joint stiffness and arthritis in her hands and knees. She reports a history of 2 blood clots in her fingers over 30 years ago. She was put on Coumadin for several months and prednisone. She denies any history of DVT or PE. She has a history of  colitis and diverticulitis, for which she has been hospitalized. She denies any history of ulcerative colitis or Crohn's disease.   She denies any new medications or supplements in the past year.   She notes a family history of breast cancer in her sister, ovarian cancer in her mother, leukemia in her brother, colon cancer in her brother, and lymphoma in her brother. None family member has undergone genetic testing.    Past Medical History:  Diagnosis Date   Connective tissue disease ([place])    DDD (degenerative disc disease), lumbar    Diverticulitis    Granuloma annulare    HTN (hypertension)    Osteoporosis     History reviewed. No pertinent surgical history.  History reviewed. No pertinent family history.  Social History:  reports that she has never smoked. She has never used smokeless tobacco. She reports that she does not drink alcohol or use drugs.She lives in [place]. The patient is alone today.  Allergies:  Allergies  Allergen Reactions   Clindamycin/Lincomycin Anaphylaxis   Codeine Nausea Only   Erythromycin Anaphylaxis   Ivp Dye [Iodinated Diagnostic Agents] Anaphylaxis   Metrizamide Anaphylaxis   Monosodium Glutamate Shortness Of Breath and Swelling    Tongue swelling   Moxifloxacin Anaphylaxis   Penicillins Anaphylaxis and Rash    .Has patient had a PCN reaction causing immediate rash, facial/tongue/throat swelling, SOB or lightheadedness with hypotension: Yes Has patient had a PCN reaction causing severe rash involving mucus membranes or skin necrosis: No Has patient had a PCN reaction that required hospitalization: Unknown Has patient had a PCN reaction occurring within the last 10 years: Yes If all of the above answers are "NO", then may proceed with Cephalosporin use.    Tramadol     Patient discloses she "went crazy"   Amlodipine Swelling   Avelox [Moxifloxacin Hcl In Nacl]    Shellfish Allergy Hives    seafood   Zoster Vaccine Live  Rash    Current Medications: Current Outpatient Medications  Medication Sig Dispense Refill   aspirin EC 81 MG EC tablet Take 1 tablet (81 mg total) by mouth daily. 30 tablet 0   atorvastatin (LIPITOR) 40 MG tablet Take 1 tablet (40 mg total) by mouth daily at 6 PM. 30 tablet 0   carvedilol (COREG) 25 MG tablet Take 25 mg by mouth 2 (two) times daily.     Cholecalciferol (VITAMIN D3) 1000 units CAPS Take 1,000 Units by mouth daily.     hydrALAZINE (APRESOLINE) 50 MG tablet Take 1 tablet (50 mg total) by mouth 2 (two) times daily. (Patient taking differently: Take 100 mg by mouth 2 (two) times daily. 50 mg mid day) 30 tablet 0   lisinopril (PRINIVIL,ZESTRIL) 40 MG tablet Take 1 tablet (40 mg total) by mouth daily. 30 tablet 0   lisinopril (ZESTRIL) 40 MG tablet Take by mouth.     acetaminophen (TYLENOL) 325 MG tablet Take 2 tablets (650 mg total) by mouth every 6 (six) hours as needed for mild pain (or Fever >/= 101). (Patient not taking: Reported on 10/26/2018)     citalopram (CELEXA) 10 MG tablet Take 1  tablet (10 mg total) by mouth daily. (Patient not taking: Reported on 10/26/2018) 15 tablet 0   doxycycline (VIBRAMYCIN) 100 MG capsule Take 1 capsule (100 mg) by mouth twice daily for 10 days. (Patient not taking: Reported on 10/26/2018) 20 capsule 0   gabapentin (NEURONTIN) 600 MG tablet Take 0.5 tablets (300 mg total) by mouth at bedtime. (Patient not taking: Reported on 10/26/2018) 30 tablet 0   hydrocortisone valerate ointment (WEST-CORT) 0.2 % Apply 1 application topically 2 (two) times daily as needed (skin irritation).     ondansetron (ZOFRAN ODT) 4 MG disintegrating tablet Take 1 tablet (4 mg total) by mouth every 8 (eight) hours as needed for nausea or vomiting. (Patient not taking: Reported on 10/26/2018) 20 tablet 0   senna (SENOKOT) 8.6 MG TABS tablet Take 1 tablet (8.6 mg total) by mouth daily. (Patient not taking: Reported on 10/26/2018) 30 each 0   triamcinolone cream  (KENALOG) 0.1 % Apply 1 application topically 2 (two) times daily as needed (itching).     Wheat Dextrin (BENEFIBER) POWD Take 1 Dose by mouth daily.     No current facility-administered medications for this visit.     Review of Systems  Constitutional: Negative.  Negative for chills, diaphoresis, fever, malaise/fatigue and weight loss.  HENT: Negative.  Negative for congestion, hearing loss, sinus pain and sore throat.   Eyes: Negative.  Negative for blurred vision.  Respiratory: Negative.  Negative for cough, shortness of breath and wheezing.   Cardiovascular: Negative.  Negative for chest pain, palpitations, claudication, leg swelling and PND.  Gastrointestinal: Negative.  Negative for abdominal pain, blood in stool, constipation, diarrhea, melena, nausea and vomiting.       Regular BMs. No ice pica  Genitourinary: Negative.  Negative for dysuria, frequency, hematuria and urgency.  Musculoskeletal: Positive for joint pain (arthritis, hands and knee). Negative for back pain and myalgias.  Skin: Negative.  Negative for rash.  Neurological: Negative.  Negative for dizziness, tingling, sensory change, weakness and headaches.  Endo/Heme/Allergies: Negative.  Does not bruise/bleed easily.       Raynaud's  Psychiatric/Behavioral: Negative.  Negative for depression, memory loss and substance abuse. The patient is not nervous/anxious and does not have insomnia.   All other systems reviewed and are negative.  Performance status (ECOG): 1  Vitals Blood pressure (!) 173/70, pulse 64, temperature 97.7 F (36.5 C), temperature source Tympanic, resp. rate 18, height 5\' 3"  (1.6 m), weight 125 lb 7.1 oz (56.9 kg), SpO2 98 %.   Physical Exam  Constitutional: She is oriented to person, place, and time. She appears well-developed and well-nourished. No distress.  HENT:  Head: Normocephalic and atraumatic.  Mouth/Throat: Oropharynx is clear and moist. No oropharyngeal exudate.  Short white hair.  Mask.  Eyes: Pupils are equal, round, and reactive to light. Conjunctivae and EOM are normal. No scleral icterus.  Blue eyes s/p cataract surgery.  Neck: Normal range of motion. Neck supple. No JVD present.  Cardiovascular: Normal rate, regular rhythm and normal heart sounds.  No murmur heard. Pulmonary/Chest: Effort normal and breath sounds normal. No respiratory distress. She has no wheezes.  Abdominal: Soft. Bowel sounds are normal. She exhibits no distension and no mass. There is no abdominal tenderness. There is no rebound and no guarding.  Musculoskeletal: Normal range of motion.        General: No edema.  Lymphadenopathy:    She has no cervical adenopathy.    She has no axillary adenopathy.  Right: No supraclavicular adenopathy present.       Left: No supraclavicular adenopathy present.  Neurological: She is alert and oriented to person, place, and time.  Skin: Skin is warm and dry. She is not diaphoretic. No erythema.  Psychiatric: She has a normal mood and affect. Her behavior is normal. Judgment and thought content normal.  Nursing note and vitals reviewed.   No visits with results within 3 Day(s) from this visit.  Latest known visit with results is:  Admission on 04/06/2018, Discharged on 04/07/2018  Component Date Value Ref Range Status   WBC 04/06/2018 7.6  4.0 - 10.5 K/uL Final   RBC 04/06/2018 3.69* 3.87 - 5.11 MIL/uL Final   Hemoglobin 04/06/2018 10.9* 12.0 - 15.0 g/dL Final   HCT 04/06/2018 32.7* 36.0 - 46.0 % Final   MCV 04/06/2018 88.6  80.0 - 100.0 fL Final   MCH 04/06/2018 29.5  26.0 - 34.0 pg Final   MCHC 04/06/2018 33.3  30.0 - 36.0 g/dL Final   RDW 04/06/2018 15.4  11.5 - 15.5 % Final   Platelets 04/06/2018 218  150 - 400 K/uL Final   nRBC 04/06/2018 0.0  0.0 - 0.2 % Final   Performed at [facility], [address] [place], [place] [address]   Sodium 04/06/2018 133* 135 - 145 mmol/L Final   Potassium 04/06/2018 3.5  3.5 - 5.1  mmol/L Final   Chloride 04/06/2018 99  98 - 111 mmol/L Final   CO2 04/06/2018 25  22 - 32 mmol/L Final   Glucose, Bld 04/06/2018 120* 70 - 99 mg/dL Final   BUN 04/06/2018 8  8 - 23 mg/dL Final   Creatinine, Ser 04/06/2018 0.64  0.44 - 1.00 mg/dL Final   Calcium 04/06/2018 8.6* 8.9 - 10.3 mg/dL Final   Total Protein 04/06/2018 7.9  6.5 - 8.1 g/dL Final   Albumin 04/06/2018 2.8* 3.5 - 5.0 g/dL Final   AST 04/06/2018 24  15 - 41 U/L Final   ALT 04/06/2018 21  0 - 44 U/L Final   Alkaline Phosphatase 04/06/2018 67  38 - 126 U/L Final   Total Bilirubin 04/06/2018 0.5  0.3 - 1.2 mg/dL Final   GFR calc non Af Amer 04/06/2018 >60  >60 mL/min Final   GFR calc Af Amer 04/06/2018 >60  >60 mL/min Final   Anion gap 04/06/2018 9  5 - 15 Final   Performed at [facility], [place], [place] [address]   Color, Urine 04/06/2018 YELLOW* YELLOW Final   APPearance 04/06/2018 CLEAR* CLEAR Final   Specific Gravity, Urine 04/06/2018 1.008  1.005 - 1.030 Final   pH 04/06/2018 7.0  5.0 - 8.0 Final   Glucose, UA 04/06/2018 NEGATIVE  NEGATIVE mg/dL Final   Hgb urine dipstick 04/06/2018 NEGATIVE  NEGATIVE Final   Bilirubin Urine 04/06/2018 NEGATIVE  NEGATIVE Final   Ketones, ur 04/06/2018 NEGATIVE  NEGATIVE mg/dL Final   Protein, ur 04/06/2018 NEGATIVE  NEGATIVE mg/dL Final   Nitrite 04/06/2018 NEGATIVE  NEGATIVE Final   Leukocytes, UA 04/06/2018 SMALL* NEGATIVE Final   RBC / HPF 04/06/2018 6-10  0 - 5 RBC/hpf Final   WBC, UA 04/06/2018 11-20  0 - 5 WBC/hpf Final   Bacteria, UA 04/06/2018 RARE* NONE SEEN Final   Squamous Epithelial / LPF 04/06/2018 0-5  0 - 5 Final   Mucus 04/06/2018 PRESENT   Final   Performed at [facility], [address], [place], [place] [address]   Influenza A  By PCR 04/06/2018 NEGATIVE  NEGATIVE Final   Influenza B By PCR 04/06/2018 NEGATIVE  NEGATIVE Final   Comment: (NOTE) The Xpert Xpress Flu assay is intended as  an aid in the diagnosis of  influenza and should not be used as a sole basis for treatment.  This  assay is FDA approved for nasopharyngeal swab specimens only. Nasal  washings and aspirates are unacceptable for Xpert Xpress Flu testing. Performed at [facility], [address] [place], [place] [address]    Lactic Acid, Venous 04/06/2018 0.77  0.5 - 1.9 mmol/L Final   Specimen Description 04/06/2018    Final                   Value:URINE, CLEAN CATCH Performed at [facility], [address], [place], [place] [address]    Special Requests 04/06/2018    Final                   Value:Normal Performed at [facility], [address] [place], [place] [address]    Culture 04/06/2018    Final                   Value:NO GROWTH Performed at [facility], [place] [address] [place], [place] [address]    Report Status 04/06/2018 04/08/2018 FINAL   Final   Procalcitonin 04/06/2018 <0.10  ng/mL Final   Comment:        Interpretation: PCT (Procalcitonin) <= 0.5 ng/mL: Systemic infection (sepsis) is not likely. Local bacterial infection is possible. (NOTE)       Sepsis PCT Algorithm           Lower Respiratory Tract                                      Infection PCT Algorithm    ----------------------------     ----------------------------         PCT < 0.25 ng/mL                PCT < 0.10 ng/mL         Strongly encourage             Strongly discourage   discontinuation of antibiotics    initiation of antibiotics    ----------------------------     -----------------------------       PCT 0.25 - 0.50 ng/mL            PCT 0.10 - 0.25 ng/mL               OR       >80% decrease in PCT            Discourage initiation of                                            antibiotics      Encourage discontinuation           of antibiotics    ----------------------------     -----------------------------         PCT >= 0.50 ng/mL              PCT 0.26 - 0.50  ng/mL  AND                                 <80% decrease in PCT             Encourage initiation of                                             antibiotics       Encourage continuation           of antibiotics    ----------------------------     -----------------------------        PCT >= 0.50 ng/mL                  PCT > 0.50 ng/mL               AND         increase in PCT                  Strongly encourage                                      initiation of antibiotics    Strongly encourage escalation           of antibiotics                                     -----------------------------                                           PCT <= 0.25 ng/mL                                                 OR                                        > 80% decrease in PCT                                     Discontinue / Do not initiate                                             antibiotics Performed at [facility], [address] [place], [place] [address]    Specimen Description 04/07/2018 BLOOD LEFT ARM   Final   Special Requests 04/07/2018 BOTTLES DRAWN AEROBIC AND ANAEROBIC Blood Culture results may not be optimal due to an excessive volume of blood received in culture bottles   Final   Culture 04/07/2018    Final  Value:NO GROWTH 5 DAYS Performed at [facility], [address] [place], [place] [address]    Report Status 04/07/2018 04/12/2018 FINAL   Final   Specimen Description 04/07/2018 BLOOD RIGHT ARM   Final   Special Requests 04/07/2018 BOTTLES DRAWN AEROBIC AND ANAEROBIC Blood Culture adequate volume   Final   Culture 04/07/2018    Final                   Value:NO GROWTH 5 DAYS Performed at [facility], [address], [place], [place] [address]    Report Status 04/07/2018 04/12/2018 FINAL   Final   Troponin I 04/06/2018 <0.03  <0.03 ng/mL Final   Performed at [facility], [place], [place] [address]   B Natriuretic Peptide 04/06/2018 155.0* 0.0 - 100.0 pg/mL Final   Performed at [facility], [address] [place], [place] [address]    Assessment:  Janet Duffy is a 85 y.o. female with iron deficiency anemia, leukopenia, and an IgA monoclonal gammopathy.  WBC has ranged from 3,900 - 5,600 from 2007-2020.  WBC was 2,700 on 09/24/2018.   She has an undifferentiated connective tissue disease.  She is followed by Dr Almeta Monas at [facility] rheumatology.  She has a history of digital vascular insufficiency, embolus by arteriogram, positive rheumatoid factor (1:1280), elevated TPA inhibitor, flare of symmetrical polyarthritis 2002, elevated sed rate/CRP, monoclonal IgA lambda, and episodes of morning stiffness, and polyarthralgias improved with steroids  She has a history of colitis/diverticulits x 3.  She doe snot have ulcerative colitis or Crohn's disease.  She required 1 unit of PRBCs 4 years ago for GI bleed.  He has had an elevated IgA since 2007.  IgA has been followed: 2170 on 01/23/2006, 2340 on 03/02/2006 and 2470 on 10/14/2015.  She has an IgA lambda monoclonal protein.  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    M spike has been followed (gm/dL): 1.57 on 05/11/2015, 1.56 on 03/02/2006, 1.52 on 03/08/2007, 1.59 on 04/10/2008, 1.91 on 04/16/2009, 1.20 on 07/19/2012, 1.36 on 01/17/2013, 1.59 on 10/07/2014, 1.65 on 08/13/2015, and 2.15 on 10/04/2018.  Kappa free light chains were 1.56, lambda free light chains 1.98, and ratio 0.79 on 10/14/2015  She has iron deficiency.  Ferritin has been followed: 38 on 04/16/2009, 11 on 05/13/2010, 3 on 10/07/2014, and 8 on 09/24/2018.   Labs on 09/24/2018: hematocrit 35.9, hemoglobin 12.0, MCV 90, platelets 152,000, WBC 2,700 (ANC 1,400).  Differential included 53% segs, 29% lymphs, 12% monocytes and 5% eosinophils.  Creatinine 0.8.   Calcium 9.3.  Protein 8.8 (high) and albumin 2.9 (low).  TSH was 2.91.  HIV, hepatitis B (surface antigen, core antibody total, surface antibody) and hepatitis C were negative on 10/14/2015.  Symptomatically, she denies any fevers, sweats or weight loss.  She denies any bone pain.  She had influenza and pneumonia in 01/2019.   Plan: 1.  Labs today:  CBC with diff, CMP, retic, ferritin, iron studies, myeloma panel, FLCA, B12, folate, flow cytometry. 2.  Iron deficiency anemia  She denies any melena, hematochezia, hematuria or vaginal bleeding.  She notes a history of GI bleed 4 years ago.  She has been on oral iron 1 day x 2 weeks.   Discuss taking oral iron with OJ or vitamin C.  Check iron stores. 3.  Neutropenia   Patient has an undifferentiated connective  tissue disorder.   Phone follow-up with rheumatology-done.  Patient denies any new medications or herbal products.  Discuss work-up.  Review neutropenic precautions. 4.  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  M-spike has fluctuated, but appears to be increasing.  Review CRAB criteria:  hypercalcemia, renal insufficiency, anemia, and bone lesions.  Collect 24 hour urine for UPEP and free light chains. 5.   RTC in 2 weeks ([place]) for review of results and discussion regarding direction of therapy.  I discussed the assessment and treatment plan with the patient.  The patient was provided an opportunity to ask questions and all were answered.  The patient agreed with the plan and demonstrated an understanding of the instructions.  The patient was advised to call back if the symptoms worsen or if the condition fails to improve as anticipated.  I provided 35 minutes of face-to-face time during this this encounter and > 50% was spent counseling as documented under my assessment and plan.    Melissa C. Mike Gip, MD, PhD    10/26/2018, 2:03 PM  I, Molly  Dorshimer, am acting as Education administrator for [employer]. Mike Gip, MD, PhD.  I, Melissa C. Mike Gip, MD, have reviewed the above documentation for accuracy and completeness, and I agree with the above.

## 2018-10-26 ENCOUNTER — Encounter: Payer: Self-pay | Admitting: Hematology and Oncology

## 2018-10-26 ENCOUNTER — Inpatient Hospital Stay: Payer: Medicare HMO

## 2018-10-26 ENCOUNTER — Inpatient Hospital Stay: Payer: Medicare HMO | Attending: Hematology and Oncology | Admitting: Hematology and Oncology

## 2018-10-26 ENCOUNTER — Other Ambulatory Visit: Payer: Self-pay

## 2018-10-26 VITALS — BP 173/70 | HR 64 | Temp 97.7°F | Resp 18 | Ht 63.0 in | Wt 125.4 lb

## 2018-10-26 DIAGNOSIS — D72819 Decreased white blood cell count, unspecified: Secondary | ICD-10-CM

## 2018-10-26 DIAGNOSIS — D509 Iron deficiency anemia, unspecified: Secondary | ICD-10-CM | POA: Diagnosis not present

## 2018-10-26 DIAGNOSIS — D472 Monoclonal gammopathy: Secondary | ICD-10-CM

## 2018-10-26 DIAGNOSIS — Z79899 Other long term (current) drug therapy: Secondary | ICD-10-CM

## 2018-10-26 DIAGNOSIS — D473 Essential (hemorrhagic) thrombocythemia: Secondary | ICD-10-CM | POA: Insufficient documentation

## 2018-10-26 LAB — FOLATE: Folate: 16.5 ng/mL (ref >5.9)

## 2018-10-26 LAB — CBC WITH DIFFERENTIAL/PLATELET
Abs Immature Granulocytes: 0.02 10*3/uL (ref 0.00–0.07)
Basophils Absolute: 0 10*3/uL (ref 0.0–0.1)
Basophils Relative: 1 %
Eosinophils Absolute: 0.2 10*3/uL (ref 0.0–0.5)
Eosinophils Relative: 4 %
HCT: 33.2 % — ABNORMAL LOW (ref 36.0–46.0)
Hemoglobin: 10.9 g/dL — ABNORMAL LOW (ref 12.0–15.0)
Immature Granulocytes: 1 %
Lymphocytes Relative: 38 %
Lymphs Abs: 1.6 10*3/uL (ref 0.7–4.0)
MCH: 30.4 pg (ref 26.0–34.0)
MCHC: 32.8 g/dL (ref 30.0–36.0)
MCV: 92.5 fL (ref 80.0–100.0)
Monocytes Absolute: 0.5 10*3/uL (ref 0.1–1.0)
Monocytes Relative: 12 %
Neutro Abs: 1.9 10*3/uL (ref 1.7–7.7)
Neutrophils Relative %: 44 %
Platelets: 172 10*3/uL (ref 150–400)
RBC: 3.59 MIL/uL — ABNORMAL LOW (ref 3.87–5.11)
RDW: 16.8 % — ABNORMAL HIGH (ref 11.5–15.5)
WBC: 4.3 10*3/uL (ref 4.0–10.5)
nRBC: 0 % (ref 0.0–0.2)

## 2018-10-26 LAB — COMPREHENSIVE METABOLIC PANEL
ALT: 12 U/L (ref 0–44)
AST: 15 U/L (ref 15–41)
Albumin: 3 g/dL — ABNORMAL LOW (ref 3.5–5.0)
Alkaline Phosphatase: 52 U/L (ref 38–126)
Anion gap: 8 (ref 5–15)
BUN: 11 mg/dL (ref 8–23)
CO2: 27 mmol/L (ref 22–32)
Calcium: 8.9 mg/dL (ref 8.9–10.3)
Chloride: 100 mmol/L (ref 98–111)
Creatinine, Ser: 0.79 mg/dL (ref 0.44–1.00)
GFR calc Af Amer: >60 mL/min (ref >60)
GFR calc non Af Amer: >60 mL/min (ref >60)
Glucose, Bld: 104 mg/dL — ABNORMAL HIGH (ref 70–99)
Potassium: 3.8 mmol/L (ref 3.5–5.1)
Sodium: 135 mmol/L (ref 135–145)
Total Bilirubin: 0.3 mg/dL (ref 0.3–1.2)
Total Protein: 8.5 g/dL — ABNORMAL HIGH (ref 6.5–8.1)

## 2018-10-26 LAB — IRON AND TIBC
Iron: 70 ug/dL (ref 28–170)
Saturation Ratios: 29 % (ref 10.4–31.8)
TIBC: 244 ug/dL — ABNORMAL LOW (ref 250–450)
UIBC: 174 ug/dL

## 2018-10-26 LAB — RETICULOCYTES
Immature Retic Fract: 2.3 % (ref 2.3–15.9)
RBC.: 3.71 MIL/uL — ABNORMAL LOW (ref 3.87–5.11)
Retic Count, Absolute: 23 10*3/uL (ref 19.0–186.0)
Retic Ct Pct: 0.6 % (ref 0.4–3.1)

## 2018-10-26 LAB — VITAMIN B12: Vitamin B-12: 79 pg/mL — ABNORMAL LOW (ref 180–914)

## 2018-10-29 ENCOUNTER — Other Ambulatory Visit: Payer: Self-pay

## 2018-10-29 ENCOUNTER — Telehealth: Payer: Self-pay

## 2018-10-29 DIAGNOSIS — D472 Monoclonal gammopathy: Secondary | ICD-10-CM

## 2018-10-29 DIAGNOSIS — D509 Iron deficiency anemia, unspecified: Secondary | ICD-10-CM

## 2018-10-29 LAB — KAPPA/LAMBDA LIGHT CHAINS
Kappa free light chain: 21.5 mg/L — ABNORMAL HIGH (ref 3.3–19.4)
Kappa, lambda light chain ratio: 1.03 (ref 0.26–1.65)
Lambda free light chains: 20.8 mg/L (ref 5.7–26.3)

## 2018-10-29 LAB — COMP PANEL: LEUKEMIA/LYMPHOMA

## 2018-10-29 LAB — MULTIPLE MYELOMA PANEL, SERUM
Albumin SerPl Elph-Mcnc: 3.3 g/dL (ref 2.9–4.4)
Albumin/Glob SerPl: 0.7 (ref 0.7–1.7)
Alpha 1: 0.3 g/dL (ref 0.0–0.4)
Alpha2 Glob SerPl Elph-Mcnc: 0.8 g/dL (ref 0.4–1.0)
B-Globulin SerPl Elph-Mcnc: 3.6 g/dL — ABNORMAL HIGH (ref 0.7–1.3)
Gamma Glob SerPl Elph-Mcnc: 0.1 g/dL — ABNORMAL LOW (ref 0.4–1.8)
Globulin, Total: 4.8 g/dL — ABNORMAL HIGH (ref 2.2–3.9)
IgA: 2491 mg/dL — ABNORMAL HIGH (ref 64–422)
IgG (Immunoglobin G), Serum: 1015 mg/dL (ref 586–1602)
IgM (Immunoglobulin M), Srm: 25 mg/dL — ABNORMAL LOW (ref 26–217)
M Protein SerPl Elph-Mcnc: 2.4 g/dL — ABNORMAL HIGH
Total Protein ELP: 8.1 g/dL (ref 6.0–8.5)

## 2018-10-29 NOTE — Telephone Encounter (Signed)
-----   Message from Lequita Asal, MD sent at 10/27/2018  4:41 AM EDT ----- Regarding: Please call patient  B12 extremely low.  Begin B12 1000 mcg po q day with recheck B12 level in 1 month OR  B12 injections weekly x 6 then B12 monthly.  M ----- Message ----- From: Buel Ream, [facility] [place] Sent: 10/26/2018   2:41 PM EDT To: Lequita Asal, MD

## 2018-10-29 NOTE — Telephone Encounter (Signed)
Spoke with the patient to inform her that her B-12 levels are low and per dr Mike Gip she will need to start Oral B-12 1,000 mcg  daily and recheck labs in 1 month and if still low consider b-12 injection.. The patient was agreeable with taking B-12 oral and recheck labs / has been schedule for 11/30/2018 at 9:15 AM. The patient was agreeable and understanding.

## 2018-10-30 LAB — FREE K+L LT CHAINS,QN,UR
Free Kappa Lt Chains,Ur: 7.7 mg/L (ref 0.63–113.79)
Free Kappa/Lambda Ratio: >11.85 (ref 1.03–31.76)
Free Lambda Lt Chains,Ur: <0.65 mg/L (ref 0.47–11.77)
Total Volume: 2350

## 2018-11-02 LAB — IFE+PROTEIN ELECTRO, 24-HR UR
% BETA, Urine: 0 %
ALPHA 1 URINE: 0 %
Albumin, U: 100 %
Alpha 2, Urine: 0 %
GAMMA GLOBULIN URINE: 0 %
Total Protein, Urine-Ur/day: 167 mg/24 hr — ABNORMAL HIGH (ref 30–150)
Total Protein, Urine: 7.1 mg/dL
Total Volume: 2350

## 2018-11-08 NOTE — Progress Notes (Signed)
[facility]  [address], [address] [place], [place] [address] Phone: [phone]  Fax: [phone]   Telemedicine Office Visit:  11/09/2018  Referring physician: Don Broach, MD  I connected with [name] on 11/09/2018 at 10:51 AM by videoconferencing and verified that I was speaking with the correct person using 2 identifiers.  The patient was at home.  I discussed the limitations, risk, security and privacy concerns of performing an evaluation and management service by videoconferencing and the availability of in person appointments.  I also discussed with the patient that there may be a patient responsible charge related to this service.  The patient expressed understanding and agreed to proceed.   Chief Complaint: Janet Duffy is a 85 y.o. female  with iron deficiency anemia, leukopenia, and a monoclonal gammopathy who is seen for review work-up and discussion regarding direction of therapy.   HPI: The patient was last seen in the hematology clinic on 10/26/2018 for initial consultation.  She has an undifferentiated connective tissue disorder.  She described colitis/diverticulitis x 3.  Symptomatically, she denied any fevers, sweats or weight loss.  She denied any bone pain.  She had influenza and pneumonia in 01/2019.    Work-up on 10/26/2018 revealed: hematocrit 33.2, hemoglobin 10.9, MCV 92.5, platelets 172,000, WBC 4,300 with an ANC of 1900.  Differential was unremarkable.  Protein was 8.5, albumin 3.0, and calcium 8.9. Retic count was 0.6%. Iron saturation was 29% with a TIBC 244. B12 was 79 (low) and folate 16.5. IgG 1015, IgA 2491 (high), and IgM 25.  M-protein 2.4 gm/dL IgA monoclonal protein with lambda light chain specificity.  Kappa light chain were 21.5, lambda light chain 20.8, ratio 1.03 (normal).  24 hour UPEP revealed 167 mg/24 hours of protein.  There was no monoclonal protein with kappa free light chains 7.70, lambda free light chains <  0.65 and ratio > 11.85 (1.03-31.76).  Flow cytometry showed 23% of T cells with loss of CD7 (seen in both reactive and neoplastic processes).  There was no monoclonal B cell population.   She was contacted on 10/29/2018 regarding low B-12 levels. She began oral B12 on 10/30/2018.   During the interim, the patient has been doing well. She was prescribed Risedronate for her osteoporosis but has not taken it yet due to concern over side effects.    Past Medical History:  Diagnosis Date  . Connective tissue disease ([place])   . DDD (degenerative disc disease), lumbar   . Diverticulitis   . Granuloma annulare   . HTN (hypertension)   . Osteoporosis    History reviewed. No pertinent surgical history.  History reviewed. No pertinent family history.  Social History:  reports that she has never smoked. She has never used smokeless tobacco. She reports that she does not drink alcohol or use drugs. She lives in [place]. The patient is accompanied by her husband today.  Participants in the patient's visit and their role in the encounter included the patient, her husband, and Vito Berger, CMA, today. The intake visit was provided by Vito Berger, CMA.  Allergies:  Allergies  Allergen Reactions  . Clindamycin/Lincomycin Anaphylaxis  . Codeine Nausea Only  . Erythromycin Anaphylaxis  . Ivp Dye [Iodinated Diagnostic Agents] Anaphylaxis  . Metrizamide Anaphylaxis  . Monosodium Glutamate Shortness Of Breath and Swelling    Tongue swelling  . Moxifloxacin Anaphylaxis  . Penicillins Anaphylaxis and Rash    .Has patient had a PCN reaction causing immediate rash, facial/tongue/throat swelling, SOB  or lightheadedness with hypotension: Yes Has patient had a PCN reaction causing severe rash involving mucus membranes or skin necrosis: No Has patient had a PCN reaction that required hospitalization: Unknown Has patient had a PCN reaction occurring within the last 10 years: Yes If all of the  above answers are "NO", then may proceed with Cephalosporin use.   . Tramadol     Patient discloses she "went crazy"  . Amlodipine Swelling  . Avelox [Moxifloxacin Hcl In Nacl]   . Shellfish Allergy Hives    seafood  . Zoster Vaccine Live Rash    Current Medications: Current Outpatient Medications  Medication Sig Dispense Refill  . aspirin EC 81 MG EC tablet Take 1 tablet (81 mg total) by mouth daily. 30 tablet 0  . atorvastatin (LIPITOR) 40 MG tablet Take 1 tablet (40 mg total) by mouth daily at 6 PM. 30 tablet 0  . carvedilol (COREG) 25 MG tablet Take 25 mg by mouth 2 (two) times daily.    . Cholecalciferol (VITAMIN D3) 1000 units CAPS Take 1,000 Units by mouth daily.    . hydrALAZINE (APRESOLINE) 50 MG tablet Take 1 tablet (50 mg total) by mouth 2 (two) times daily. (Patient taking differently: Take 100 mg by mouth 2 (two) times daily. 50 mg mid day) 30 tablet 0  . lisinopril (PRINIVIL,ZESTRIL) 40 MG tablet Take 1 tablet (40 mg total) by mouth daily. 30 tablet 0  . Wheat Dextrin (BENEFIBER) POWD Take 1 Dose by mouth daily.    Marland Kitchen acetaminophen (TYLENOL) 325 MG tablet Take 2 tablets (650 mg total) by mouth every 6 (six) hours as needed for mild pain (or Fever >/= 101). (Patient not taking: Reported on 10/26/2018)    . citalopram (CELEXA) 10 MG tablet Take 1 tablet (10 mg total) by mouth daily. (Patient not taking: Reported on 10/26/2018) 15 tablet 0  . doxycycline (VIBRAMYCIN) 100 MG capsule Take 1 capsule (100 mg) by mouth twice daily for 10 days. (Patient not taking: Reported on 10/26/2018) 20 capsule 0  . gabapentin (NEURONTIN) 600 MG tablet Take 0.5 tablets (300 mg total) by mouth at bedtime. (Patient not taking: Reported on 10/26/2018) 30 tablet 0  . hydrocortisone valerate ointment (WEST-CORT) 0.2 % Apply 1 application topically 2 (two) times daily as needed (skin irritation).    Marland Kitchen lisinopril (ZESTRIL) 40 MG tablet Take by mouth.    . ondansetron (ZOFRAN ODT) 4 MG disintegrating tablet  Take 1 tablet (4 mg total) by mouth every 8 (eight) hours as needed for nausea or vomiting. (Patient not taking: Reported on 10/26/2018) 20 tablet 0  . senna (SENOKOT) 8.6 MG TABS tablet Take 1 tablet (8.6 mg total) by mouth daily. (Patient not taking: Reported on 10/26/2018) 30 each 0  . triamcinolone cream (KENALOG) 0.1 % Apply 1 application topically 2 (two) times daily as needed (itching).     No current facility-administered medications for this visit.     Review of Systems  Constitutional: Negative.  Negative for chills, diaphoresis, fever, malaise/fatigue and weight loss.  HENT: Negative.  Negative for congestion, hearing loss, nosebleeds, sinus pain and sore throat.   Eyes: Negative.  Negative for blurred vision.  Respiratory: Negative.  Negative for cough, shortness of breath and wheezing.   Cardiovascular: Negative.  Negative for chest pain, palpitations, claudication, leg swelling and PND.  Gastrointestinal: Negative.  Negative for abdominal pain, blood in stool, constipation, diarrhea, melena, nausea and vomiting.       Regular BMs. No ice pica.  Genitourinary: Negative.  Negative for dysuria, frequency, hematuria and urgency.  Musculoskeletal: Positive for joint pain (arthritis, hands and knee). Negative for back pain and myalgias.  Skin: Negative.  Negative for rash.  Neurological: Negative.  Negative for dizziness, tingling, sensory change, weakness and headaches.  Endo/Heme/Allergies: Negative.  Does not bruise/bleed easily.       Raynaud's.  Psychiatric/Behavioral: Negative.  Negative for depression, memory loss and substance abuse. The patient is not nervous/anxious and does not have insomnia.   All other systems reviewed and are negative.   Performance status (ECOG): 1  Physical Exam  Constitutional: She is oriented to person, place, and time. She appears well-developed and well-nourished. No distress.  HENT:  Short white hair.  Eyes: Conjunctivae and EOM are normal.   Blue eyes s/p cataract surgery.  Neurological: She is alert and oriented to person, place, and time.  Psychiatric: She has a normal mood and affect. Her behavior is normal. Judgment and thought content normal.  Nursing note reviewed.   No visits with results within 3 Day(s) from this visit.  Latest known visit with results is:  Orders Only on 10/29/2018  Component Date Value Ref Range Status  . Total Protein, Urine 10/29/2018 7.1  Not Estab. mg/dL Final  . Total Protein, Urine-Ur/day 10/29/2018 167* 30 - 150 mg/24 hr Final  . Albumin, U 10/29/2018 100.0  % Final  . ALPHA 1 URINE 10/29/2018 0.0  % Final  . Alpha 2, Urine 10/29/2018 0.0  % Final  . % BETA, Urine 10/29/2018 0.0  % Final  . GAMMA GLOBULIN URINE 10/29/2018 0.0  % Final  . M-SPIKE %, Urine 10/29/2018 Not Observed  Not Observed % Final  . Immunofixation Result, Urine 10/29/2018 Comment   Final   Comment: (NOTE) The immunofixation pattern appears unremarkable. Evidence of monoclonal protein is not apparent.   . Note: 10/29/2018 Comment   Final   Comment: (NOTE) Protein electrophoresis scan will follow via computer, mail, or courier delivery. Performed At: [facility] [place], [place] [id number] Rush Farmer MD JT:[id number]   . Total Volume 10/29/2018 2,350   Final   Performed at [facility], [address], [place], [place] [address]  . Free Kappa Lt Chains,Ur 10/29/2018 7.70  0.63 - 113.79 mg/L Final  . Free Lambda Lt Chains,Ur 10/29/2018 <0.65  0.47 - 11.77 mg/L Final  . Free Kappa/Lambda Ratio 10/29/2018 >11.85  1.03 - 31.76 Final   Comment: (NOTE) Performed At: [facility] [place], [place] [id number] Rush Farmer MD FH:[id number]   . Total Volume 10/29/2018 2,350   Final   Performed at [facility], [address], [place], [place] [address]    Assessment:  Chrisann Melaragno is a 85 y.o. female with iron deficiency anemia,  leukopenia, and an IgA monoclonal gammopathy.  WBC has ranged from 3,900 - 5,600 from 2007-2020.  WBC was 2,700 on 09/24/2018.   She has an undifferentiated connective tissue disease.  She is followed by Dr Almeta Monas at [facility] rheumatology.  She has a history of digital vascular insufficiency, embolus by arteriogram, positive rheumatoid factor (1:1280), elevated TPA inhibitor, flare of symmetrical polyarthritis 2002, elevated sed rate/CRP, monoclonal IgA lambda, and episodes of morning stiffness, and polyarthralgias improved with steroids  She has a history of colitis/diverticulits x 3.  She doe snot have ulcerative colitis or Crohn's disease.  She required 1 unit of PRBCs 4 years ago for GI bleed.  She has an IgA  monoclonal gammopathy with lambda light chain specificity.  She has had an elevated IgA since 2007.  IgA has been followed: 2170 on 01/23/2006, 2340 on 03/02/2006, 2470 on 10/14/2015, and 2491 on 10/26/2018.  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    M spike has been followed (gm/dL): 1.57 on 05/11/2015, 1.56 on 03/02/2006, 1.52 on 03/08/2007, 1.59 on 04/10/2008, 1.91 on 04/16/2009, 1.20 on 07/19/2012, 1.36 on 01/17/2013, 1.59 on 10/07/2014, 1.65 on 08/13/2015, 2.15 on 10/04/2018, and 2.4 on 10/26/2018.  Kappa free light chains were 1.56, lambda free light chains 1.98, and ratio 0.79 on 10/14/2015  She has iron deficiency.  Ferritin has been followed: 38 on 04/16/2009, 11 on 05/13/2010, 3 on 10/07/2014, and 8 on 09/24/2018.   Labs on 09/24/2018:  hematocrit 35.9, hemoglobin 12.0, MCV 90, platelets 152,000, WBC 2,700 (ANC 1,400).  Differential included 53% segs, 29% lymphs, 12% monocytes and 5% eosinophils.  Creatinine 0.8.  Calcium 9.3.  Protein 8.8 (high) and albumin 2.9 (low).  TSH was 2.91.  HIV, hepatitis B (surface antigen, core antibody total, surface antibody) and hepatitis C were negative on  10/14/2015.  Work-up on 10/26/2018 revealed: hematocrit 33.2, hemoglobin 10.9, MCV 92.5, platelets 172,000, WBC 4,300 with an ANC of 1900.  Differential was unremarkable.  Protein was 8.5, albumin 3.0, and calcium 8.9.  Retic count was 0.6%. Iron saturation was 29% with a TIBC 244. B12 was 79 (low) and folate normal.  IgA 2491 (high).   M-protein was 2.4 gm/dL IgA monoclonal protein with lambda light chain specificity.  Kappa light chain were 21.5, lambda light chain 20.8, ratio 1.03 (normal).  24 hour UPEP revealed 167 mg/24 hours of protein.  There was no monoclonal protein with kappa free light chains 7.70, lambda free light chains < 0.65 and ratio > 11.85 (1.03-31.76).  Flow cytometry showed 23% of T cells with loss of CD7 (seen in both reactive and neoplastic processes).  There was no monoclonal B cell population.   She has B12 deficiency.  B12 was 79 on 10/26/2018.  Symptomatically, she denies any bone pain.  Plan: 1.   IgA monoclonal gammopathy with lambda light chain specificity  Review work-up.  M spike reveals a 2.4 gm/dL IgA monoclonal protein with lambda light chain specific specificity.  24-hour UPEP revealed no M spike but excess free lambda light chains.  Review review CRAB criteria.  Discuss plans to complete staging with PET scan and bone marrow.  Bone marrow procedure described in detail.  Risk versus benefits discussed.   Patient consented to procedure. 2.   Iron deficiency anemia             Hematocrit 33.2.  Hemoglobin 10.9.  MCV 92.5.    Ferritin 8 on 09/24/2018.  She denies any bleeding.             She has a history of GI bleed 4 years ago.    Continue oral iron with vitamin C. 3.   Neutropenia, improved              WBC 4300 with an ANC 1900.  Patient has an undifferentiated connective tissue disorder.             Patient on new medications or herbal products.             Neutropenic precautions previously reviewed. 4.   B12 deficiency  B12 was 79 on  10/26/2018.  She  began oral B12 on 10/30/2018.  Check B12 level in 1 month.  If B12 level not > 400, begin B12 injections. 5.   RTC 10 days after bone marrow for MD assessment, review of imaging and marrow, and discussion regarding direction of therapy.  I discussed the assessment and treatment plan with the patient.  The patient was provided an opportunity to ask questions and all were answered.  The patient agreed with the plan and demonstrated an understanding of the instructions.  The patient was advised to call back or seek an in person evaluation if the symptoms worsen or if the condition fails to improve as anticipated.  I provided 19 minutes (10:51 AM - 11:10 AM) of face-to-face video visit time during this this encounter and > 50% was spent counseling as documented under my assessment and plan.  I provided these services from the [facility] office.   Nolon Stalls, MD, PhD  11/09/2018, 10:51 AM  I, Cloyde Reams Dorshimer, am acting as Education administrator for [employer]. Mike Gip, MD, PhD.  I,  C. Mike Gip, MD, have reviewed the above documentation for accuracy and completeness, and I agree with the above.

## 2018-11-09 ENCOUNTER — Telehealth: Payer: Self-pay

## 2018-11-09 ENCOUNTER — Encounter: Payer: Self-pay | Admitting: Hematology and Oncology

## 2018-11-09 ENCOUNTER — Inpatient Hospital Stay: Payer: Medicare HMO | Attending: Hematology and Oncology | Admitting: Hematology and Oncology

## 2018-11-09 DIAGNOSIS — D509 Iron deficiency anemia, unspecified: Secondary | ICD-10-CM | POA: Diagnosis not present

## 2018-11-09 DIAGNOSIS — D472 Monoclonal gammopathy: Secondary | ICD-10-CM | POA: Diagnosis not present

## 2018-11-09 DIAGNOSIS — E538 Deficiency of other specified B group vitamins: Secondary | ICD-10-CM | POA: Diagnosis not present

## 2018-11-09 DIAGNOSIS — D72819 Decreased white blood cell count, unspecified: Secondary | ICD-10-CM | POA: Insufficient documentation

## 2018-11-09 NOTE — Telephone Encounter (Signed)
faxed orders for bx to scheduling. [phone]

## 2018-11-09 NOTE — Telephone Encounter (Signed)
Spoke with the patient to inform her that she has been schedule for the bone marrow bx for 11/19/2018 The patient to arrive at 7:30 AM for a 8:30 AM  The patient was understanding and agreeable.

## 2018-11-09 NOTE — Progress Notes (Signed)
No new changes noted today. The patient name and DOB has been verified by phone today. 

## 2018-11-15 ENCOUNTER — Other Ambulatory Visit: Payer: Self-pay

## 2018-11-15 ENCOUNTER — Ambulatory Visit
Admission: RE | Admit: 2018-11-15 | Discharge: 2018-11-15 | Disposition: A | Discharge status: Home / Self Care | Payer: Medicare HMO | Source: Ambulatory Visit | Attending: Hematology and Oncology | Admitting: Hematology and Oncology

## 2018-11-15 DIAGNOSIS — I2510 Atherosclerotic heart disease of native coronary artery without angina pectoris: Secondary | ICD-10-CM | POA: Insufficient documentation

## 2018-11-15 DIAGNOSIS — I700 Atherosclerosis of aorta: Secondary | ICD-10-CM | POA: Diagnosis not present

## 2018-11-15 DIAGNOSIS — D472 Monoclonal gammopathy: Secondary | ICD-10-CM | POA: Insufficient documentation

## 2018-11-15 DIAGNOSIS — D649 Anemia, unspecified: Secondary | ICD-10-CM | POA: Insufficient documentation

## 2018-11-15 LAB — GLUCOSE, CAPILLARY: Glucose-Capillary: 87 mg/dL (ref 70–99)

## 2018-11-15 MED ORDER — FLUDEOXYGLUCOSE F - 18 (FDG) INJECTION
6.8300 | Freq: Once | INTRAVENOUS | Status: AC | PRN
  Administered 2018-11-15: 6.83 via INTRAVENOUS

## 2018-11-16 ENCOUNTER — Telehealth: Payer: Self-pay

## 2018-11-16 ENCOUNTER — Other Ambulatory Visit: Payer: Self-pay | Admitting: Radiology

## 2018-11-16 NOTE — Telephone Encounter (Signed)
Last office note faxed to [employer] for insurance authorization request.

## 2018-11-19 ENCOUNTER — Other Ambulatory Visit: Payer: Self-pay

## 2018-11-19 ENCOUNTER — Ambulatory Visit
Admission: RE | Admit: 2018-11-19 | Discharge: 2018-11-19 | Disposition: A | Discharge status: Home / Self Care | Payer: Medicare HMO | Source: Ambulatory Visit | Attending: Hematology and Oncology | Admitting: Hematology and Oncology

## 2018-11-19 ENCOUNTER — Other Ambulatory Visit (HOSPITAL_COMMUNITY)
Admission: RE | Admit: 2018-11-19 | Disposition: A | Discharge status: Home / Self Care | Payer: Medicare HMO | Source: Ambulatory Visit | Attending: Hematology and Oncology | Admitting: Hematology and Oncology

## 2018-11-19 DIAGNOSIS — D72819 Decreased white blood cell count, unspecified: Secondary | ICD-10-CM | POA: Insufficient documentation

## 2018-11-19 DIAGNOSIS — D472 Monoclonal gammopathy: Secondary | ICD-10-CM | POA: Diagnosis present

## 2018-11-19 LAB — CBC WITH DIFFERENTIAL/PLATELET
Abs Immature Granulocytes: 0.01 10*3/uL (ref 0.00–0.07)
Basophils Absolute: 0 10*3/uL (ref 0.0–0.1)
Basophils Relative: 1 %
Eosinophils Absolute: 0.1 10*3/uL (ref 0.0–0.5)
Eosinophils Relative: 3 %
HCT: 39.1 % (ref 36.0–46.0)
Hemoglobin: 12.5 g/dL (ref 12.0–15.0)
Immature Granulocytes: 0 %
Lymphocytes Relative: 27 %
Lymphs Abs: 1 10*3/uL (ref 0.7–4.0)
MCH: 30.4 pg (ref 26.0–34.0)
MCHC: 32 g/dL (ref 30.0–36.0)
MCV: 95.1 fL (ref 80.0–100.0)
Monocytes Absolute: 0.4 10*3/uL (ref 0.1–1.0)
Monocytes Relative: 10 %
Neutro Abs: 2.3 10*3/uL (ref 1.7–7.7)
Neutrophils Relative %: 59 %
Platelets: 187 10*3/uL (ref 150–400)
RBC: 4.11 MIL/uL (ref 3.87–5.11)
RDW: 16.6 % — ABNORMAL HIGH (ref 11.5–15.5)
WBC: 3.8 10*3/uL — ABNORMAL LOW (ref 4.0–10.5)
nRBC: 0 % (ref 0.0–0.2)

## 2018-11-19 LAB — PROTIME-INR
INR: 1.1 (ref 0.8–1.2)
Prothrombin Time: 14.2 seconds (ref 11.4–15.2)

## 2018-11-19 MED ORDER — MIDAZOLAM HCL 5 MG/5ML IJ SOLN
INTRAMUSCULAR | Status: AC
  Filled 2018-11-19: qty 5

## 2018-11-19 MED ORDER — MIDAZOLAM HCL 2 MG/2ML IJ SOLN
INTRAMUSCULAR | Status: AC | PRN
  Administered 2018-11-19: 1 mg via INTRAVENOUS

## 2018-11-19 MED ORDER — SODIUM CHLORIDE 0.9 % IV SOLN
INTRAVENOUS | Status: DC
  Administered 2018-11-19: 08:00:00 via INTRAVENOUS

## 2018-11-19 MED ORDER — FENTANYL CITRATE (PF) 100 MCG/2ML IJ SOLN
INTRAMUSCULAR | Status: AC | PRN
  Administered 2018-11-19: 50 ug via INTRAVENOUS

## 2018-11-19 MED ORDER — FENTANYL CITRATE (PF) 100 MCG/2ML IJ SOLN
INTRAMUSCULAR | Status: AC
  Filled 2018-11-19: qty 2

## 2018-11-19 MED ORDER — HEPARIN SOD (PORK) LOCK FLUSH 100 UNIT/ML IV SOLN
INTRAVENOUS | Status: AC
  Filled 2018-11-19: qty 5

## 2018-11-19 NOTE — Discharge Instructions (Signed)
Moderate Conscious Sedation, Adult, Care After °These instructions provide you with information about caring for yourself after your procedure. Your health care provider may also give you more specific instructions. Your treatment has been planned according to current medical practices, but problems sometimes occur. Call your health care provider if you have any problems or questions after your procedure. °What can I expect after the procedure? °After your procedure, it is common: °· To feel sleepy for several hours. °· To feel clumsy and have poor balance for several hours. °· To have poor judgment for several hours. °· To vomit if you eat too soon. °Follow these instructions at home: °For at least 24 hours after the procedure: ° °· Do not: °? Participate in activities where you could fall or become injured. °? Drive. °? Use heavy machinery. °? Drink alcohol. °? Take sleeping pills or medicines that cause drowsiness. °? Make important decisions or sign legal documents. °? Take care of children on your own. °· Rest. °Eating and drinking °· Follow the diet recommended by your health care provider. °· If you vomit: °? Drink water, juice, or soup when you can drink without vomiting. °? Make sure you have little or no nausea before eating solid foods. °General instructions °· Have a responsible adult stay with you until you are awake and alert. °· Take over-the-counter and prescription medicines only as told by your health care provider. °· If you smoke, do not smoke without supervision. °· Keep all follow-up visits as told by your health care provider. This is important. °Contact a health care provider if: °· You keep feeling nauseous or you keep vomiting. °· You feel light-headed. °· You develop a rash. °· You have a fever. °Get help right away if: °· You have trouble breathing. °This information is not intended to replace advice given to you by your health care provider. Make sure you discuss any questions you have  with your health care provider. °Document Released: 01/09/2013 Document Revised: 03/03/2017 Document Reviewed: 07/11/2015 °Elsevier Patient Education © 2020 Elsevier Inc[employer] ° ° °Bone Marrow Aspiration and Bone Marrow Biopsy, Adult, Care After °This sheet gives you information about how to care for yourself after your procedure. Your health care provider may also give you more specific instructions. If you have problems or questions, contact your health care provider. °What can I expect after the procedure? °After the procedure, it is common to have: °· Mild pain and tenderness. °· Swelling. °· Bruising. °Follow these instructions at home: °Puncture site care ° °  ° °· Follow instructions from your health care provider about how to take care of the puncture site. Make sure you: °? Wash your hands with soap and water before you change your bandage (dressing). If soap and water are not available, use hand sanitizer. °? Change your dressing as told by your health care provider. °· Check your puncture site every day for signs of infection. Check for: °? More redness, swelling, or pain. °? More fluid or blood. °? Warmth. °? Pus or a bad smell. °General instructions °· Take over-the-counter and prescription medicines only as told by your health care provider. °· Do not take baths, swim, or use a hot tub until your health care provider approves. Ask if you can take a shower or have a sponge bath. °· Return to your normal activities as told by your health care provider. Ask your health care provider what activities are safe for you. °· Do not drive for 24 hours if you were   given a medicine to help you relax (sedative) during your procedure. °· Keep all follow-up visits as told by your health care provider. This is important. °Contact a health care provider if: °· Your pain is not controlled with medicine. °Get help right away if: °· You have a fever. °· You have more redness, swelling, or pain around the puncture site. °· You  have more fluid or blood coming from the puncture site. °· Your puncture site feels warm to the touch. °· You have pus or a bad smell coming from the puncture site. °These symptoms may represent a serious problem that is an emergency. Do not wait to see if the symptoms will go away. Get medical help right away. Call your local emergency services (911 in the [place] Do not drive yourself to the hospital. °Summary °· After the procedure, it is common to have mild pain, tenderness, swelling, and bruising. °· Follow instructions from your health care provider about how to take care of the puncture site. °· Get help right away if you have any symptoms of infection or if you have more blood or fluid coming from the puncture site. °This information is not intended to replace advice given to you by your health care provider. Make sure you discuss any questions you have with your health care provider. °Document Released: 10/08/2004 Document Revised: 07/04/2017 Document Reviewed: 09/02/2015 °Elsevier Patient Education © 2020 Elsevier Inc. ° ° °

## 2018-11-19 NOTE — Progress Notes (Signed)
Patient clinically stable post BMB per DR Kathlene Cote, tolerated well. Alert and oriented. Received Versed 1mg  along with Fentanyl 47mcg IV for procedure. Vitals remained stable throughout. Taking po's without difficulty.

## 2018-11-19 NOTE — Procedures (Signed)
Interventional Radiology Procedure Note  Procedure: CT guided bone marrow aspiration and biopsy  Complications: None  EBL: < 10 mL  Findings: Aspirate and core biopsy performed of bone marrow in right iliac bone.  Plan: Bedrest supine x 1 hrs  Nadalyn Deringer T. Bhavya Grand, M.D Pager:  [phone]   

## 2018-11-26 ENCOUNTER — Ambulatory Visit: Payer: Medicare HMO | Admitting: Hematology and Oncology

## 2018-11-27 NOTE — Progress Notes (Signed)
[facility]  [address] [place], [place] [address] Phone: [phone]  Fax: [phone]   Clinic Day:  11/29/2018  Referring physician: Barbaraann Boys, MD  Chief Complaint: Janet Duffy is a 85 y.o. female with iron deficiency anemia, leukopenia, and a monoclonal gammopathy who is seen for review of imaging, bone marrow biopsy, and discussion regarding direction of therapy.   HPI:  The patient was last seen in the hematology clinic on 11/09/2018. At that time, she denied any bone pain.  M spike revealed a 2.4 gm/dL IgA monoclonal protein with lambda light chain specific specificity.  CBC revealed a hematocrit 33.2, hemoglobin 10.9, MCV 92.5, and WBC 4,300 with an [place] 1,900.   Ferritin was 8.  We discussed plans to complete staging with PET scan and bone marrow.  PET scan on 11/15/2018 revealed no evidence of myeloma or plasmacytoma.  CBC on 11/19/2018 revealed a hematocrit of 39.1, hemoglobin 12.5, MCV 95.1, platelets 187,000, WBC 3800 with an ANC of 2300.    Bone marrow biopsy on 11/19/2018 revealed a hypercellular marrow for age (40%). Myeloid and erythroid elements were present in normal proportions. Megakaryocytes demonstrated a spectrum of maturation without clustering. There were no atypical lymphoid aggregates (CD20, CD3) or granulomas. CD138 highlighted an increased plasma cell population (~10%) which were lambda restricted by kappa and lambda in-situ hybridization. A subset of the plasma cells also expressed CD56.   The differential included a non-IgM monoclonal gammopathy of undetermined significance (MGUS) and plasma cell myeloma. Correlation with radiographic findings and laboratory data was required to characterize the plasma cell neoplasm.  During the interim, the patient feels "alright". She has no complaints. She is taking 1 pill of oral iron with orange juice and vitamin C daily. She has been on oral B-12 1,000 mcg x 1 month. Her GI is Dr[facility]  Ezzard Flax at [facility].    Past Medical History:  Diagnosis Date  . Connective tissue disease ([place])   . DDD (degenerative disc disease), lumbar   . Diverticulitis   . Granuloma annulare   . HTN (hypertension)   . Osteoporosis     History reviewed. No pertinent surgical history.  History reviewed. No pertinent family history.  Social History:  reports that she has never smoked. She has never used smokeless tobacco. She reports that she does not drink alcohol or use drugs. She lives in [place]. The patient is alone today.  Allergies:  Allergies  Allergen Reactions  . Clindamycin/Lincomycin Anaphylaxis  . Codeine Nausea Only  . Erythromycin Anaphylaxis  . Ivp Dye [Iodinated Diagnostic Agents] Anaphylaxis  . Metrizamide Anaphylaxis  . Monosodium Glutamate Shortness Of Breath and Swelling    Tongue swelling  . Moxifloxacin Anaphylaxis  . Penicillins Anaphylaxis and Rash    .Has patient had a PCN reaction causing immediate rash, facial/tongue/throat swelling, SOB or lightheadedness with hypotension: Yes Has patient had a PCN reaction causing severe rash involving mucus membranes or skin necrosis: No Has patient had a PCN reaction that required hospitalization: Unknown Has patient had a PCN reaction occurring within the last 10 years: Yes If all of the above answers are "NO", then may proceed with Cephalosporin use.   . Tramadol     Patient discloses she "went crazy"  . Amlodipine Swelling  . Avelox [Moxifloxacin Hcl In Nacl]   . Shellfish Allergy Hives    seafood  . Zoster Vaccine Live Rash    Current Medications: Current Outpatient Medications  Medication Sig Dispense Refill  .  acetaminophen (TYLENOL) 325 MG tablet Take 2 tablets (650 mg total) by mouth every 6 (six) hours as needed for mild pain (or Fever >/= 101).    Marland Kitchen aspirin EC 81 MG EC tablet Take 1 tablet (81 mg total) by mouth daily. 30 tablet 0  . atorvastatin (LIPITOR) 40 MG tablet Take 1 tablet (40 mg total)  by mouth daily at 6 PM. 30 tablet 0  . carvedilol (COREG) 25 MG tablet Take 25 mg by mouth 2 (two) times daily.    . Cholecalciferol (VITAMIN D3) 1000 units CAPS Take 1,000 Units by mouth daily.    . hydrALAZINE (APRESOLINE) 50 MG tablet Take 1 tablet (50 mg total) by mouth 2 (two) times daily. (Patient taking differently: Take 100 mg by mouth 2 (two) times daily. 50 mg mid day) 30 tablet 0  . lisinopril (PRINIVIL,ZESTRIL) 40 MG tablet Take 1 tablet (40 mg total) by mouth daily. 30 tablet 0  . Wheat Dextrin (BENEFIBER) POWD Take 1 Dose by mouth daily.    . ondansetron (ZOFRAN ODT) 4 MG disintegrating tablet Take 1 tablet (4 mg total) by mouth every 8 (eight) hours as needed for nausea or vomiting. (Patient not taking: Reported on 11/28/2018) 20 tablet 0   No current facility-administered medications for this visit.     Review of Systems  Constitutional: Positive for weight loss (4 pounds, since 10/26/2018). Negative for chills, diaphoresis, fever and malaise/fatigue.       Feels "alright".  HENT: Negative.  Negative for congestion, hearing loss, nosebleeds, sinus pain and sore throat.   Eyes: Negative.  Negative for blurred vision, double vision, photophobia and pain.  Respiratory: Negative.  Negative for cough, shortness of breath and wheezing.   Cardiovascular: Negative.  Negative for chest pain, palpitations, claudication, leg swelling and PND.  Gastrointestinal: Negative.  Negative for abdominal pain, blood in stool, constipation, diarrhea, melena, nausea and vomiting.  Genitourinary: Negative.  Negative for dysuria, frequency, hematuria and urgency.  Musculoskeletal: Negative for back pain, joint pain (arthritis, hands and knee) and myalgias.  Skin: Negative.  Negative for rash.  Neurological: Negative.  Negative for dizziness, tingling, sensory change, speech change, focal weakness, weakness and headaches.  Endo/Heme/Allergies: Negative.  Does not bruise/bleed easily.       Raynaud's.   Psychiatric/Behavioral: Negative.  Negative for depression, memory loss and substance abuse. The patient is not nervous/anxious and does not have insomnia.   All other systems reviewed and are negative.  Performance status (ECOG): 1  Vitals Blood pressure (!) 167/67, pulse 63, temperature (!) 96.7 F (35.9 C), resp. rate 18, height 5' 3"  (1.6 m), weight 121 lb 14.6 oz (55.3 kg), SpO2 98 %.  Physical Exam  Constitutional: She is oriented to person, place, and time. She appears well-developed and well-nourished. No distress.  HENT:  Head: Normocephalic and atraumatic.  Short white hair.  Eyes: Conjunctivae and EOM are normal. No scleral icterus.  Blue eyes s/p cataract surgery.  Neurological: She is alert and oriented to person, place, and time.  Skin: Skin is warm and dry. No rash noted. She is not diaphoretic. No erythema. No pallor.  Right posterior iliac crest biopsy site unremarkable.  Psychiatric: She has a normal mood and affect. Her behavior is normal. Judgment and thought content normal.  Nursing note and vitals reviewed.   Appointment on 11/29/2018  Component Date Value Ref Range Status  . LDH 11/29/2018 79* 98 - 192 U/L Final   Performed at [facility], [phone]  [address], [place], [place] [address]    Assessment:  Alazae Crymes is a 85 y.o. female withiron deficiency anemia, leukopenia, and an IgA monoclonal gammopathy.WBChas ranged from3,900 - 5,600 from 2007-2020.WBC was2,700 on 09/24/2018.  She has anundifferentiated connective tissue disease. She isfollowed by Dr Almeta Monas at [facility].She has a history of digital vascular insufficiency, embolus by arteriogram, positive rheumatoid factor (1:1280), elevated TPA inhibitor, flare of symmetrical polyarthritis 2002, elevated sed rate/CRP, monoclonal IgA lambda,and episodes of morning stiffness,andpolyarthralgiasimproved with steroids  She has a history of colitis/diverticulitsx 3.  She doe snot have ulcerative colitis or Crohn's disease. She required 1 unit of PRBCs4 years ago for GI bleed.  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  She has had an elevated IgAsince 2007. IgA has been followed: 2170 on 01/23/2006, 2340 on 03/02/2006, 2470 on 10/14/2015, and 2491 on 10/26/2018. In10/16/2014,she was noted to have a biclonal gammopathy with an IgA lambda and a less intense IgG kappa monoclonal protein.  M spikehas been followed (gm/dL): 1.57 on02/09/2015,1.56on 03/02/2006,1.52 on 03/08/2007, 1.59 on 04/10/2008, 1.91 on 04/16/2009, 1.20on 07/19/2012,1.36 on 01/17/2013, 1.59 on07/08/2014, 1.65 on05/02/2016,2.15 on07/05/2018, and 2.4 on 10/26/2018.Kappa free light chains were 1.56, lambda free light chains 1.98,and ratio 0.79 on07/03/2016  Work-up on 10/26/2018 revealed: hematocrit 33.2, hemoglobin 10.9, MCV 92.5, platelets 172,000, WBC 4,300 with an ANC of 1900.  Differential was unremarkable.  Protein was 8.5, albumin 3.0, and calcium 8.9.  Retic count was 0.6%. Iron saturation was 29% with a TIBC 244. B12 was 79 (low) and folate normal.  IgA 2491 (high).   M-protein was 2.4 gm/dL IgA monoclonal protein with lambda light chain specificity.  Kappa light chain were 21.5, lambda light chain 20.8, ratio 1.03 (normal).  24 hour UPEP revealed 167 mg/24 hours of protein.  There was no monoclonal protein with kappa free light chains 7.70, lambda free light chains < 0.65 and ratio > 11.85 (1.03-31.76).  Flow cytometry showed 23% of T cells with loss of CD7 (seen in both reactive and neoplastic processes).  There was no monoclonal B cell population.   PET scan on 11/15/2018 revealed no evidence of myeloma or plasmacytoma.  Bone marrow biopsy on 11/19/2018 revealed a hypercellular marrow for age (40%). Myeloid and erythroid elements were present in normal proportions. Megakaryocytes demonstrated a spectrum of maturation without clustering.  There were no atypical lymphoid aggregates (CD20, CD3) or granulomas. CD138 highlighted an increased plasma cell population (~10%) which were lambda restricted.  A subset of the plasma cells also expressed CD56.   Differential included a non-IgM monoclonal gammopathy of undetermined significance (MGUS) and plasma cell myeloma.   She has iron deficiency.Ferritinhas been followed: 38 on 04/16/2009, 11 on 05/13/2010, 3 on 10/07/2014, and8 on 09/24/2018.  Labs on 09/24/2018:  hematocrit 35.9, hemoglobin 12.0, MCV 90, platelets 152,000, WBC 2,700(ANC 1,400).Differential included 53% segs, 29% lymphs, 12% monocytes and 5% eosinophils. Creatinine 0.8.Calcium 9.3. Protein 8.8 (high)and albumin 2.9 (low).TSHwas 2.91.HIV, hepatitis B (surface antigen, core antibody total, surface antibody) and hepatitis C were negative on07/03/2016.  She has B12 deficiency.  B12 was 79 on 10/26/2018.  She is on oral B12.  B12 was 405 on 11/29/2018.   Symptomatically, she feels "alright".  Exam is stable.  Plan: 1.   Labs today:  B12, anti-parietal antibodies, anti-intrinsic antibodies, LDH, beta2-microglobin, ferritin. 2.   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  Review bone marrow- 10% plasma cells which were lambda restricted.             M spike reveals a 2.4 gm/dL IgA monoclonal protein with lambda light chain specific specificity.             24-hour UPEP revealed no M spike but excess free lambda light chains.             No CRAB criteria.    Review criteria for non IgM MGUS:   M-spike < 3 gm/dL.   < 10% plasma cells in the marrow.   No CRAB criteria.  Review criteria for asymptomatic smoldering myeloma   M spike > 3 gm/dL.   >= 10% to < 60% plasma cells in the marrow.   No CRAB criteria  Discuss plan for monitoring MGUS vs asymptomatic myeloma   6 months vs 3 months  Discuss close monitoring. 3.   Iron  deficiency anemia Hematocrit  39.1.  Hemoglobin 12.5.  MCV 95.1 on 11/19/2018.               Ferritin was 8 on 09/24/2018.             Hemoglobin was 10.9 on 10/26/2018.  She denies any bleeding.  She sees Dr. Candis Shine at [facility] (appointment in 02/2019).  Continue oral iron with orange juice or vitamin C. 4.   Leukopenia WBC 3800 with an ANC 2300.             Patient has an undifferentiated connective tissue disorder. She denies any new medications or herbal products. Continue neutropenic precautions.  Continue to monitor. 5.   B12 deficiency             B12 was 79 on 10/26/2018 and 405 today.             She began oral B12 on 10/30/2018.             Monitor folate periodically. 6.     RTC in 3 months for MD assessment and labs (CBC with diff, CMP, SPEP, ferritin-day before).  I discussed the assessment and treatment plan with the patient.  The patient was provided an opportunity to ask questions and all were answered.  The patient agreed with the plan and demonstrated an understanding of the instructions.  The patient was advised to call back if the symptoms worsen or if the condition fails to improve as anticipated.   Lequita Asal, MD, PhD    11/29/2018, 9:03 AM  I, Selena Batten, am acting as scribe for [employer]. Mike Gip, MD, PhD.  I,  C. Mike Gip, MD, have reviewed the above documentation for accuracy and completeness, and I agree with the above.

## 2018-11-28 ENCOUNTER — Other Ambulatory Visit: Payer: Self-pay | Admitting: Hematology and Oncology

## 2018-11-28 DIAGNOSIS — D472 Monoclonal gammopathy: Secondary | ICD-10-CM

## 2018-11-28 DIAGNOSIS — E538 Deficiency of other specified B group vitamins: Secondary | ICD-10-CM

## 2018-11-28 DIAGNOSIS — D509 Iron deficiency anemia, unspecified: Secondary | ICD-10-CM

## 2018-11-28 NOTE — Progress Notes (Signed)
Confirmed Name, DOB, and Address. Denies any concerns.  

## 2018-11-29 ENCOUNTER — Inpatient Hospital Stay: Payer: Medicare HMO

## 2018-11-29 ENCOUNTER — Inpatient Hospital Stay (HOSPITAL_BASED_OUTPATIENT_CLINIC_OR_DEPARTMENT_OTHER): Payer: Medicare HMO | Admitting: Hematology and Oncology

## 2018-11-29 ENCOUNTER — Other Ambulatory Visit: Payer: Self-pay

## 2018-11-29 ENCOUNTER — Encounter: Payer: Self-pay | Admitting: Hematology and Oncology

## 2018-11-29 VITALS — BP 167/67 | HR 63 | Temp 96.7°F | Resp 18 | Ht 63.0 in | Wt 121.9 lb

## 2018-11-29 DIAGNOSIS — D472 Monoclonal gammopathy: Secondary | ICD-10-CM

## 2018-11-29 DIAGNOSIS — D72819 Decreased white blood cell count, unspecified: Secondary | ICD-10-CM | POA: Diagnosis not present

## 2018-11-29 DIAGNOSIS — E538 Deficiency of other specified B group vitamins: Secondary | ICD-10-CM

## 2018-11-29 DIAGNOSIS — D509 Iron deficiency anemia, unspecified: Secondary | ICD-10-CM

## 2018-11-29 LAB — FERRITIN: Ferritin: 14 ng/mL (ref 11–307)

## 2018-11-29 LAB — LACTATE DEHYDROGENASE: LDH: 79 U/L — ABNORMAL LOW (ref 98–192)

## 2018-11-29 LAB — VITAMIN B12: Vitamin B-12: 405 pg/mL (ref 180–914)

## 2018-11-29 NOTE — Progress Notes (Signed)
No new changes noted today 

## 2018-11-30 ENCOUNTER — Other Ambulatory Visit: Payer: Medicare HMO

## 2018-11-30 LAB — INTRINSIC FACTOR ANTIBODIES: Intrinsic Factor: 8 AU/mL — ABNORMAL HIGH (ref 0.0–1.1)

## 2018-11-30 LAB — BETA 2 MICROGLOBULIN, SERUM: Beta-2 Microglobulin: 2.9 mg/L — ABNORMAL HIGH (ref 0.6–2.4)

## 2018-12-03 LAB — ANTI-PARIETAL ANTIBODY: Parietal Cell Antibody-IgG: 101.3 Units — ABNORMAL HIGH (ref 0.0–20.0)

## 2019-02-18 ENCOUNTER — Telehealth: Payer: Self-pay

## 2019-02-18 ENCOUNTER — Inpatient Hospital Stay: Payer: Medicare HMO | Attending: Hematology and Oncology

## 2019-02-18 ENCOUNTER — Other Ambulatory Visit: Payer: Self-pay

## 2019-02-18 DIAGNOSIS — I10 Essential (primary) hypertension: Secondary | ICD-10-CM | POA: Insufficient documentation

## 2019-02-18 DIAGNOSIS — D509 Iron deficiency anemia, unspecified: Secondary | ICD-10-CM | POA: Insufficient documentation

## 2019-02-18 DIAGNOSIS — D510 Vitamin B12 deficiency anemia due to intrinsic factor deficiency: Secondary | ICD-10-CM | POA: Diagnosis not present

## 2019-02-18 DIAGNOSIS — D72819 Decreased white blood cell count, unspecified: Secondary | ICD-10-CM | POA: Insufficient documentation

## 2019-02-18 DIAGNOSIS — D472 Monoclonal gammopathy: Secondary | ICD-10-CM | POA: Diagnosis not present

## 2019-02-18 DIAGNOSIS — Z79899 Other long term (current) drug therapy: Secondary | ICD-10-CM | POA: Insufficient documentation

## 2019-02-18 LAB — COMPREHENSIVE METABOLIC PANEL
ALT: 9 U/L (ref 0–44)
AST: 14 U/L — ABNORMAL LOW (ref 15–41)
Albumin: 3.2 g/dL — ABNORMAL LOW (ref 3.5–5.0)
Alkaline Phosphatase: 41 U/L (ref 38–126)
Anion gap: 5 (ref 5–15)
BUN: 15 mg/dL (ref 8–23)
CO2: 28 mmol/L (ref 22–32)
Calcium: 8.9 mg/dL (ref 8.9–10.3)
Chloride: 102 mmol/L (ref 98–111)
Creatinine, Ser: 0.7 mg/dL (ref 0.44–1.00)
GFR calc Af Amer: >60 mL/min (ref >60)
GFR calc non Af Amer: >60 mL/min (ref >60)
Glucose, Bld: 105 mg/dL — ABNORMAL HIGH (ref 70–99)
Potassium: 3.8 mmol/L (ref 3.5–5.1)
Sodium: 135 mmol/L (ref 135–145)
Total Bilirubin: 0.6 mg/dL (ref 0.3–1.2)
Total Protein: 8.3 g/dL — ABNORMAL HIGH (ref 6.5–8.1)

## 2019-02-18 LAB — CBC WITH DIFFERENTIAL/PLATELET
Abs Immature Granulocytes: 0 10*3/uL (ref 0.00–0.07)
Basophils Absolute: 0 10*3/uL (ref 0.0–0.1)
Basophils Relative: 1 %
Eosinophils Absolute: 0.2 10*3/uL (ref 0.0–0.5)
Eosinophils Relative: 6 %
HCT: 37 % (ref 36.0–46.0)
Hemoglobin: 12 g/dL (ref 12.0–15.0)
Immature Granulocytes: 0 %
Lymphocytes Relative: 35 %
Lymphs Abs: 1 10*3/uL (ref 0.7–4.0)
MCH: 31.2 pg (ref 26.0–34.0)
MCHC: 32.4 g/dL (ref 30.0–36.0)
MCV: 96.1 fL (ref 80.0–100.0)
Monocytes Absolute: 0.4 10*3/uL (ref 0.1–1.0)
Monocytes Relative: 12 %
Neutro Abs: 1.3 10*3/uL — ABNORMAL LOW (ref 1.7–7.7)
Neutrophils Relative %: 46 %
Platelets: 151 10*3/uL (ref 150–400)
RBC: 3.85 MIL/uL — ABNORMAL LOW (ref 3.87–5.11)
RDW: 14.7 % (ref 11.5–15.5)
WBC: 2.8 10*3/uL — ABNORMAL LOW (ref 4.0–10.5)
nRBC: 0 % (ref 0.0–0.2)

## 2019-02-18 LAB — FERRITIN: Ferritin: 21 ng/mL (ref 11–307)

## 2019-02-18 NOTE — Telephone Encounter (Signed)
spoke with the patient to inform her that her WBC has decreased to 2.8 and i have reveiwed the precauation for Fever and Neutropenia. The patient was understanding and agreeable.

## 2019-02-18 NOTE — Telephone Encounter (Signed)
-----   Message from Lequita Asal, MD sent at 02/18/2019 10:17 AM EST ----- Regarding: Please call patient  Please let her know about her CBC and ferritin.  She should be on oral iron.  Please let her know that her WBC has decreased.  Review fever and neutropenia precautions.  M ----- Message ----- From: Buel Ream, [facility] [place] Sent: 02/18/2019   9:18 AM EST To: Lequita Asal, MD

## 2019-02-19 LAB — PROTEIN ELECTROPHORESIS, SERUM
A/G Ratio: 0.7 (ref 0.7–1.7)
Albumin ELP: 3.5 g/dL (ref 2.9–4.4)
Alpha-1-Globulin: 0.2 g/dL (ref 0.0–0.4)
Alpha-2-Globulin: 0.6 g/dL (ref 0.4–1.0)
Beta Globulin: 4 g/dL — ABNORMAL HIGH (ref 0.7–1.3)
Gamma Globulin: 0.1 g/dL — ABNORMAL LOW (ref 0.4–1.8)
Globulin, Total: 4.9 g/dL — ABNORMAL HIGH (ref 2.2–3.9)
M-Spike, %: 2.4 g/dL — ABNORMAL HIGH
Total Protein ELP: 8.4 g/dL (ref 6.0–8.5)

## 2019-02-19 NOTE — Progress Notes (Signed)
[facility]  [address] [place], [place] [address] Phone: [phone]  Fax: [phone]   Clinic Day:  02/21/2019  Referring physician: Barbaraann Boys, MD  Chief Complaint: Janet Duffy is a 85 y.o. female with iron deficiency anemia, leukopenia, and a monoclonal gammopathy who is seen for 3 month assessment.  HPI: The patient was last seen in the hematology clinic on 11/29/2018. At that time, she felt "all right". Ferritin 14. Parietal call antibody was 101.3 (high). Intrinsic factor antibody was 8.0 (high). Beta-2 microglobulin 2.9. LDH was 79. Vitamin B12 was 405.  Exam was stable. She continued oral iron with OJ or vitamin C.   She was seen by dermatologist Dr. Leanord Hawking on 01/15/2019. Patient had 2 areas that were slightly scaly and pruritic. One was on the right lateral cheek and one was on the right upper arm. She had some increased breakouts on her forehead. There were no other areas that were changing or bleeding. The patient was cautious with sun protection and sun avoidance. Dr. Doyle Askew discussed daily sun protection, and reinitiating topical therapy. She will follow up in 6 months.   Labs on 02/18/2019 revealed a hematocrit 37.0, hemoglobin 12.0, MCV 96.1, platelets 151,000, WBC 2,800 (ANC 1,300). Ferritin was 21. Creatinine was 0.70, calcium 8.9, protein 8.3, and albumin 3.2 (low).  M-spike was 2.4 gm/dL (stable).  During the interim, she has felt "ok".  She is taking oral B-12 1,000 mcg daily. She is taking oral iron once daily. She denies being on any new medications or herbal products. She notes taking Tylenol and half a Benadryl to help her rest. She has a good diet. She was seen by her new rheumatologist 1 day ago for an initial consult. She stated the rheumatologist will continue to monitor her.   Her daughter states that her mother has had a lot of anxiety and worries about her health all the time. I advised patient and her daughter to  be careful and avoid large crowds.    Past Medical History:  Diagnosis Date  . Connective tissue disease ([place])   . DDD (degenerative disc disease), lumbar   . Diverticulitis   . Granuloma annulare   . HTN (hypertension)   . Osteoporosis     No past surgical history on file.  No family history on file.  Social History:  reports that she has never smoked. She has never used smokeless tobacco. She reports that she does not drink alcohol or use drugs. She lives in [place]. The patient is accompanied by her daughter today.  Allergies:  Allergies  Allergen Reactions  . Clindamycin/Lincomycin Anaphylaxis  . Codeine Nausea Only  . Erythromycin Anaphylaxis  . Ivp Dye [Iodinated Diagnostic Agents] Anaphylaxis  . Metrizamide Anaphylaxis  . Monosodium Glutamate Shortness Of Breath and Swelling    Tongue swelling  . Moxifloxacin Anaphylaxis  . Penicillins Anaphylaxis and Rash    .Has patient had a PCN reaction causing immediate rash, facial/tongue/throat swelling, SOB or lightheadedness with hypotension: Yes Has patient had a PCN reaction causing severe rash involving mucus membranes or skin necrosis: No Has patient had a PCN reaction that required hospitalization: Unknown Has patient had a PCN reaction occurring within the last 10 years: Yes If all of the above answers are "NO", then may proceed with Cephalosporin use.   . Tramadol     Patient discloses she "went crazy"  . Amlodipine Swelling  . Avelox [Moxifloxacin Hcl In Nacl]   . Shellfish Allergy Hives  seafood  . Zoster Vaccine Live Rash    Current Medications: Current Outpatient Medications  Medication Sig Dispense Refill  . acetaminophen (TYLENOL) 325 MG tablet Take 2 tablets (650 mg total) by mouth every 6 (six) hours as needed for mild pain (or Fever >/= 101).    Marland Kitchen aspirin EC 81 MG EC tablet Take 1 tablet (81 mg total) by mouth daily. 30 tablet 0  . carvedilol (COREG) 25 MG tablet Take 25 mg by mouth 2 (two) times  daily.    . Cholecalciferol (VITAMIN D3) 1000 units CAPS Take 1,000 Units by mouth daily.    . ferrous sulfate 325 (65 FE) MG tablet Take 325 mg by mouth daily with breakfast.    . hydrALAZINE (APRESOLINE) 50 MG tablet Take 1 tablet (50 mg total) by mouth 2 (two) times daily. (Patient taking differently: Take 100 mg by mouth 2 (two) times daily. 50 mg mid day) 30 tablet 0  . lisinopril (PRINIVIL,ZESTRIL) 40 MG tablet Take 1 tablet (40 mg total) by mouth daily. 30 tablet 0  . ondansetron (ZOFRAN ODT) 4 MG disintegrating tablet Take 1 tablet (4 mg total) by mouth every 8 (eight) hours as needed for nausea or vomiting. 20 tablet 0  . vitamin B-12 (CYANOCOBALAMIN) 1000 MCG tablet Take 1,000 mcg by mouth daily.    . Wheat Dextrin (BENEFIBER) POWD Take 1 Dose by mouth daily.     No current facility-administered medications for this visit.     Review of Systems  Constitutional: Negative.  Negative for chills, diaphoresis, fever, malaise/fatigue and weight loss (up 1 pound).       Feels "ok".  HENT: Negative.  Negative for congestion, hearing loss, nosebleeds, sinus pain and sore throat.   Eyes: Negative.  Negative for blurred vision, double vision, photophobia and pain.  Respiratory: Negative.  Negative for cough, shortness of breath and wheezing.   Cardiovascular: Negative.  Negative for chest pain, palpitations, claudication, leg swelling and PND.  Gastrointestinal: Negative.  Negative for abdominal pain, blood in stool, constipation, diarrhea, melena, nausea and vomiting.       Good diet.  Genitourinary: Negative.  Negative for dysuria, frequency, hematuria and urgency.  Musculoskeletal: Negative for back pain, joint pain (arthritis, hands and knee) and myalgias.  Skin: Negative.  Negative for rash.  Neurological: Negative.  Negative for dizziness, tingling, sensory change, speech change, focal weakness, weakness and headaches.  Endo/Heme/Allergies: Negative.  Does not bruise/bleed easily.        Raynaud's.  Psychiatric/Behavioral: Negative for depression, memory loss and substance abuse. The patient is nervous/anxious (regarding her health) and has insomnia.   All other systems reviewed and are negative.  Performance status (ECOG):  1  Vitals Blood pressure (!) 162/80, pulse 70, temperature (!) 97.4 F (36.3 C), temperature source Tympanic, resp. rate 18, height 5' 3"  (1.6 m), weight 122 lb 5.7 oz (55.5 kg), SpO2 98 %.   Physical Exam  Constitutional: She is oriented to person, place, and time. She appears well-developed and well-nourished. No distress.  HENT:  Head: Normocephalic and atraumatic.  Mouth/Throat: Oropharynx is clear and moist. No oropharyngeal exudate.  Short styled white hair.  Mask.  Eyes: Pupils are equal, round, and reactive to light. Conjunctivae and EOM are normal. No scleral icterus.  Blue eyes s/p cataract surgery.  Neck: Normal range of motion. Neck supple. No JVD present.  Cardiovascular: Normal rate, regular rhythm and normal heart sounds.  No murmur heard. Pulmonary/Chest: Effort normal and breath sounds normal. No respiratory  distress. She has no wheezes. She has no rales.  Abdominal: Soft. Bowel sounds are normal. She exhibits no distension and no mass. There is no abdominal tenderness. There is no rebound and no guarding.  Musculoskeletal: Normal range of motion.        General: No tenderness or edema.  Lymphadenopathy:       Head (right side): No preauricular, no posterior auricular and no occipital adenopathy present.       Head (left side): No preauricular, no posterior auricular and no occipital adenopathy present.    She has no cervical adenopathy.    She has no axillary adenopathy.       Right: No inguinal and no supraclavicular adenopathy present.       Left: No inguinal and no supraclavicular adenopathy present.  Neurological: She is alert and oriented to person, place, and time.  Skin: Skin is warm and dry. No rash noted. She is not  diaphoretic. No erythema. No pallor.  Psychiatric: She has a normal mood and affect. Her behavior is normal. Judgment and thought content normal.  Nursing note and vitals reviewed.   No visits with results within 3 Day(s) from this visit.  Latest known visit with results is:  Appointment on 02/18/2019  Component Date Value Ref Range Status  . Total Protein ELP 02/18/2019 8.4  6.0 - 8.5 g/dL Final  . Albumin ELP 02/18/2019 3.5  2.9 - 4.4 g/dL Final  . Alpha-1-Globulin 02/18/2019 0.2  0.0 - 0.4 g/dL Final  . Alpha-2-Globulin 02/18/2019 0.6  0.4 - 1.0 g/dL Final  . Beta Globulin 02/18/2019 4.0* 0.7 - 1.3 g/dL Final  . Gamma Globulin 02/18/2019 0.1* 0.4 - 1.8 g/dL Final  . M-Spike, % 02/18/2019 2.4* Not Observed g/dL Final  . SPE Interp. 02/18/2019 Comment   Final   Comment: (NOTE) The SPE pattern demonstrates a single peak (M-spike) in the beta region which may represent monoclonal protein. This peak may also be caused by the presence of fibrinogen or circulating immune complexes. If clinically indicated, the presence of a monoclonal gammopathy may be confirmed by serum immunofixation. Performed At: [facility] [place], [place] [id number] Rush Farmer MD BO:[id number]   . Comment 02/18/2019 Comment   Final   Comment: (NOTE) Protein electrophoresis scan will follow via computer, mail, or courier delivery.   . Globulin, Total 02/18/2019 4.9* 2.2 - 3.9 g/dL Corrected  . A/G Ratio 02/18/2019 0.7  0.7 - 1.7 Corrected  . Ferritin 02/18/2019 21  11 - 307 ng/mL Final   Performed at [facility], [address] [place], [place] [address]  . Sodium 02/18/2019 135  135 - 145 mmol/L Final  . Potassium 02/18/2019 3.8  3.5 - 5.1 mmol/L Final  . Chloride 02/18/2019 102  98 - 111 mmol/L Final  . CO2 02/18/2019 28  22 - 32 mmol/L Final  . Glucose, Bld 02/18/2019 105* 70 - 99 mg/dL Final  . BUN 02/18/2019 15  8 - 23 mg/dL Final  . Creatinine, Ser 02/18/2019  0.70  0.44 - 1.00 mg/dL Final  . Calcium 02/18/2019 8.9  8.9 - 10.3 mg/dL Final  . Total Protein 02/18/2019 8.3* 6.5 - 8.1 g/dL Final  . Albumin 02/18/2019 3.2* 3.5 - 5.0 g/dL Final  . AST 02/18/2019 14* 15 - 41 U/L Final  . ALT 02/18/2019 9  0 - 44 U/L Final  . Alkaline Phosphatase 02/18/2019 41  38 - 126 U/L Final  . Total Bilirubin 02/18/2019 0.6  0.3 -  1.2 mg/dL Final  . GFR calc non Af Amer 02/18/2019 >60  >60 mL/min Final  . GFR calc Af Amer 02/18/2019 >60  >60 mL/min Final  . Anion gap 02/18/2019 5  5 - 15 Final   Performed at [facility], [address] [place], [place] [address]  . WBC 02/18/2019 2.8* 4.0 - 10.5 K/uL Final  . RBC 02/18/2019 3.85* 3.87 - 5.11 MIL/uL Final  . Hemoglobin 02/18/2019 12.0  12.0 - 15.0 g/dL Final  . HCT 02/18/2019 37.0  36.0 - 46.0 % Final  . MCV 02/18/2019 96.1  80.0 - 100.0 fL Final  . MCH 02/18/2019 31.2  26.0 - 34.0 pg Final  . MCHC 02/18/2019 32.4  30.0 - 36.0 g/dL Final  . RDW 02/18/2019 14.7  11.5 - 15.5 % Final  . Platelets 02/18/2019 151  150 - 400 K/uL Final  . nRBC 02/18/2019 0.0  0.0 - 0.2 % Final  . Neutrophils Relative % 02/18/2019 46  % Final  . Neutro Abs 02/18/2019 1.3* 1.7 - 7.7 K/uL Final  . Lymphocytes Relative 02/18/2019 35  % Final  . Lymphs Abs 02/18/2019 1.0  0.7 - 4.0 K/uL Final  . Monocytes Relative 02/18/2019 12  % Final  . Monocytes Absolute 02/18/2019 0.4  0.1 - 1.0 K/uL Final  . Eosinophils Relative 02/18/2019 6  % Final  . Eosinophils Absolute 02/18/2019 0.2  0.0 - 0.5 K/uL Final  . Basophils Relative 02/18/2019 1  % Final  . Basophils Absolute 02/18/2019 0.0  0.0 - 0.1 K/uL Final  . Immature Granulocytes 02/18/2019 0  % Final  . Abs Immature Granulocytes 02/18/2019 0.00  0.00 - 0.07 K/uL Final   Performed at [facility], [address], [place], [place] [address]    Assessment:  Via Rosado is a 85 y.o. female withiron deficiency anemia, leukopenia, and an IgA monoclonal gammopathy.WBChas  ranged from3,900 - 5,600 from 2007-2020.WBC was2,700 on 09/24/2018.  She has anundifferentiated connective tissue disease. She isfollowed by Dr [facility] at [facility].She has a history of digital vascular insufficiency, embolus by arteriogram, positive rheumatoid factor (1:1280), elevated TPA inhibitor, flare of symmetrical polyarthritis 2002, elevated sed rate/CRP, monoclonal IgA lambda,and episodes of morning stiffness,andpolyarthralgiasimproved with steroids  She has a history of colitis/diverticulitsx 3. She doe snot have ulcerative colitis or Crohn's disease. She required 1 unit of PRBCs4 years ago for GI bleed.  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 Shehas had an elevated IgAsince 2007. IgAhas been followed: 2170 on 01/23/2006, 2340 on 03/02/2006,2470 on 10/14/2015, and 2491 on 10/26/2018. In10/16/2014,she was noted to have a biclonal gammopathy with an IgA lambda and a less intense IgG kappa monoclonal protein.  M spikehas been followed (gm/dL): 1.57 on02/09/2015,1.56on 03/02/2006,1.52 on 03/08/2007, 1.59 on 04/10/2008, 1.91 on 04/16/2009, 1.20on 07/19/2012,1.36 on 01/17/2013, 1.59 on07/08/2014, 1.65 on05/02/2016,2.15 on07/05/2018, 2.4 on 10/26/2018, and 2.4 on 02/18/2019.Kappa free light chains were 1.56, lambda free light chains 1.98,and ratio 0.79 on07/03/2016  Work-up on 07/24/2020revealed: hematocrit 33.2, hemoglobin 10.9, MCV 92.5, platelets 172,000, WBC 4,300 with an ANC of 1900. Differential was unremarkable. Protein was8.5, albumin 3.0, and calcium 8.9. Retic countwas0.6%. Iron saturationwas29%with aTIBC 244.B12was79(low) and folatenormal.IgA2491 (high). M-proteinwas2.4gm/dL IgA monoclonal protein with lambda light chain specificity. Kappa light chain were21.5, lambda light chain 20.8, ratio 1.03(normal).24 hour UPEPrevealed 167 mg/24 hours of protein. There was no  monoclonal protein with kappa free light chains 7.70, lambda free light chains <0.65 and ratio >11.85 (1.03-31.76). Flow cytometryshowed 23% of T  cells with loss of CD7 (seen in both reactive and neoplastic processes). There was nomonoclonal B cell population.   PET scan on 11/15/2018 revealed no evidence of myeloma or plasmacytoma.  Bone marrow biopsy on 11/19/2018 revealed a hypercellular marrow for age (40%). Myeloid and erythroid elements were present in normal proportions. Megakaryocytes demonstrated a spectrum of maturation without clustering. There were no atypical lymphoid aggregates (CD20, CD3) or granulomas. CD138 highlighted an increased plasma cell population (~10%) which were lambda restricted.  A subset of the plasma cells also expressed CD56.   Differential included a non-IgM monoclonal gammopathy of undetermined significance (MGUS) and plasma cell myeloma.   She has iron deficiency.Ferritinhas been followed: 38 on 04/16/2009, 11 on 05/13/2010, 3 on 10/07/2014, 8 on 09/24/2018, and 21 on 02/18/2019.  Labs on 09/24/2018:hematocrit 35.9, hemoglobin 12.0, MCV 90, platelets 152,000, WBC 2,700(ANC 1,400).Differential included 53% segs, 29% lymphs, 12% monocytes and 5% eosinophils. Creatinine 0.8.Calcium 9.3. Protein 8.8 (high)and albumin 2.9 (low).TSHwas 2.91.HIV, hepatitis B (surface antigen, core antibody total, surface antibody) and hepatitis C were negative on07/03/2016.  She has B12 deficiency. She has pernicious anemia.  Anti-parietal cell antibodies and intrinsic factor antibodies were elevated on 11/29/2018.  B12 was 79 on 10/26/2018.  She is on oral B12.  B12 was 405 on 11/29/2018.  Symptomatically, she is doing well.  She denies any bone pain or interval infections.  Exam is unremarkable.  Plan: 1.   Review labs from 02/20/2019.  2.   IgA monoclonal gammopathy with lambda light chain specificity PET scan on 11/15/2018 revealed no lytic  bone lesions.             Bone marrow on 11/19/2018 revealed 10% plasma cells which were lambda restricted. M spike reveals a 2.4gm/dLIgA monoclonal protein with lambda light chain specific specificity. 24-hour UPEP revealed no M spike but excess free lambda light chains. No CRAB criteria.               M-spike is 2.4 gm/dL on 02/18/2019 (stable).  Criteria for non IgM MGUS:                         M-spike < 3 gm/dL.                         < 10% plasma cells in the marrow.                         No CRAB criteria.             Criteria for asymptomatic smoldering myeloma                         M spike > 3 gm/dL.                         >= 10% to < 60% plasma cells in the marrow.                         No CRAB criteria             Continue to monitor every 3 months. 3. Iron deficiency anemia Hematocrit  37.0. Hemoglobin 12.0. MCV 96.1 on 02/18/2019.  Ferritinwas 21 on 011/16/2020.             She denies any bleeding.  Continue oral iron with orange juice or vitamin C. 4. Leukopenia WBC 2800 with an ANC1300. Patient has an undifferentiated connective tissue disorder. She denies any new medications or herbal products. Check copper level and anti-neutrophil antibodies today.  Review neutropenic precautions. 5.B12 deficiency B12 was 79 on07/24/2020 and 405 on 11/29/2018. Shebegan oral B12 on 10/30/2018.  She has pernicious anemia.   Anti-parietal cell antibodies and intrinsic factor antibodies are elevated.   Discuss GI surveillance. Monitor B12 and folate annually. 6.   RTC in 6 weeks for labs (CBC with diff). 7.   RTC in 3 months for MD assessment and labs (CBC with diff, CMP, SPEP, ferritin- day before).  I discussed the assessment and treatment plan with the patient.  The patient was provided an opportunity to  ask questions and all were answered.  The patient agreed with the plan and demonstrated an understanding of the instructions.  The patient was advised to call back if the symptoms worsen or if the condition fails to improve as anticipated.  I provided 22 minutes of face-to-face time during this this encounter and > 50% was spent counseling as documented under my assessment and plan.    Lequita Asal, MD, PhD    02/21/2019, 9:25 AM  I, Selena Batten, am acting as scribe for [employer]. Mike Gip, MD, PhD.  I, Melissa C. Mike Gip, MD, have reviewed the above documentation for accuracy and completeness, and I agree with the above.

## 2019-02-20 ENCOUNTER — Other Ambulatory Visit: Payer: Self-pay

## 2019-02-20 NOTE — Progress Notes (Signed)
Confirmed Name, DOB, and Address. Denies any concerns.  

## 2019-02-21 ENCOUNTER — Encounter: Payer: Self-pay | Admitting: Hematology and Oncology

## 2019-02-21 ENCOUNTER — Inpatient Hospital Stay: Payer: Medicare HMO | Admitting: Hematology and Oncology

## 2019-02-21 ENCOUNTER — Inpatient Hospital Stay: Payer: Medicare HMO

## 2019-02-21 ENCOUNTER — Other Ambulatory Visit: Payer: Self-pay

## 2019-02-21 VITALS — BP 162/80 | HR 70 | Temp 97.4°F | Resp 18 | Ht 63.0 in | Wt 122.4 lb

## 2019-02-21 DIAGNOSIS — D509 Iron deficiency anemia, unspecified: Secondary | ICD-10-CM

## 2019-02-21 DIAGNOSIS — D510 Vitamin B12 deficiency anemia due to intrinsic factor deficiency: Secondary | ICD-10-CM | POA: Diagnosis not present

## 2019-02-21 DIAGNOSIS — D472 Monoclonal gammopathy: Secondary | ICD-10-CM | POA: Diagnosis not present

## 2019-02-21 DIAGNOSIS — E538 Deficiency of other specified B group vitamins: Secondary | ICD-10-CM

## 2019-02-21 DIAGNOSIS — D72819 Decreased white blood cell count, unspecified: Secondary | ICD-10-CM | POA: Diagnosis not present

## 2019-02-26 LAB — NEUTROPHIL AB TEST LEVEL 1: NEUTROPHIL SCR/PANEL INTERP.: NEGATIVE

## 2019-02-28 LAB — COPPER, SERUM: Copper: 63 ug/dL — ABNORMAL LOW (ref 72–166)

## 2019-03-04 ENCOUNTER — Telehealth: Payer: Self-pay

## 2019-03-04 NOTE — Telephone Encounter (Signed)
-----   Message from Lequita Asal, MD sent at 03/03/2019  7:08 AM EST ----- Regarding: Please call patient  Copper level is a little low.  Is she taking a multi-vitamin with copper?  Ensure copper supplementation is only 100% of the recommended daily allowance (RDA).  M ----- Message ----- From: Buel Ream, Lab In [place] Sent: 02/26/2019   9:35 PM EST To: Lequita Asal, MD

## 2019-03-04 NOTE — Telephone Encounter (Signed)
Contacted patient and informed her of low Copper level. Patient states she is not currently taking a multivitamin. Informed her Dr. Mike Gip would like for her to start taking Multivitamin with % of daily Copper amount. Patient verbalizes understanding and denies any further questions or concerns.

## 2019-03-25 ENCOUNTER — Other Ambulatory Visit: Payer: Self-pay

## 2019-03-25 ENCOUNTER — Inpatient Hospital Stay: Payer: Medicare HMO | Attending: Hematology and Oncology

## 2019-03-25 DIAGNOSIS — D472 Monoclonal gammopathy: Secondary | ICD-10-CM | POA: Diagnosis not present

## 2019-03-25 LAB — CBC
HCT: 37.4 % (ref 36.0–46.0)
Hemoglobin: 12 g/dL (ref 12.0–15.0)
MCH: 31.6 pg (ref 26.0–34.0)
MCHC: 32.1 g/dL (ref 30.0–36.0)
MCV: 98.4 fL (ref 80.0–100.0)
Platelets: 157 10*3/uL (ref 150–400)
RBC: 3.8 MIL/uL — ABNORMAL LOW (ref 3.87–5.11)
RDW: 15 % (ref 11.5–15.5)
WBC: 3 10*3/uL — ABNORMAL LOW (ref 4.0–10.5)
nRBC: 0 % (ref 0.0–0.2)

## 2019-03-26 ENCOUNTER — Telehealth: Payer: Self-pay

## 2019-03-26 NOTE — Telephone Encounter (Signed)
Inbound call from patient requesting labs. Informed her of lab results. Patient verbalizes understanding.

## 2019-04-04 ENCOUNTER — Other Ambulatory Visit: Payer: Medicare HMO

## 2019-04-11 ENCOUNTER — Telehealth: Payer: Self-pay

## 2019-04-11 NOTE — Telephone Encounter (Signed)
-----   Message from Secundino Ginger sent at 04/11/2019 11:38 AM EST ----- Regarding: covid vaccine Contact: [phone] Patient is concerned about reaction to the shingle and flu vaccine. She wants to speak to you about this before she gets the Covid Vaccine.

## 2019-04-11 NOTE — Telephone Encounter (Signed)
Contacted patient regarding concerns about COVID vaccine. Patient reports having a reaction in the past to shingles vaccine and not feeling well after receiving the flu vaccine. Informed patient this is a completely different vaccine and advised of potential side effects of the vaccine and informed her there is no way of knowing if she would react. Advised her to talk with PCP regarding this as well. Patient also given hotline for [facility] to inquire on questions. Advised patient to reach out to office should I be able to help her with anything else. Patient verbalizes understanding and denies any further questions or concerns.

## 2019-05-21 ENCOUNTER — Other Ambulatory Visit: Payer: Self-pay

## 2019-05-22 ENCOUNTER — Other Ambulatory Visit: Payer: Self-pay

## 2019-05-22 ENCOUNTER — Inpatient Hospital Stay: Payer: Medicare HMO | Attending: Hematology and Oncology

## 2019-05-22 DIAGNOSIS — D509 Iron deficiency anemia, unspecified: Secondary | ICD-10-CM | POA: Insufficient documentation

## 2019-05-22 DIAGNOSIS — D72819 Decreased white blood cell count, unspecified: Secondary | ICD-10-CM | POA: Diagnosis not present

## 2019-05-22 DIAGNOSIS — E610 Copper deficiency: Secondary | ICD-10-CM | POA: Insufficient documentation

## 2019-05-22 DIAGNOSIS — I10 Essential (primary) hypertension: Secondary | ICD-10-CM | POA: Diagnosis not present

## 2019-05-22 DIAGNOSIS — D510 Vitamin B12 deficiency anemia due to intrinsic factor deficiency: Secondary | ICD-10-CM | POA: Insufficient documentation

## 2019-05-22 DIAGNOSIS — D472 Monoclonal gammopathy: Secondary | ICD-10-CM

## 2019-05-22 DIAGNOSIS — Z79899 Other long term (current) drug therapy: Secondary | ICD-10-CM | POA: Diagnosis not present

## 2019-05-22 DIAGNOSIS — C9000 Multiple myeloma not having achieved remission: Secondary | ICD-10-CM | POA: Diagnosis present

## 2019-05-22 LAB — CBC
HCT: 38.5 % (ref 36.0–46.0)
Hemoglobin: 12.2 g/dL (ref 12.0–15.0)
MCH: 31.4 pg (ref 26.0–34.0)
MCHC: 31.7 g/dL (ref 30.0–36.0)
MCV: 99 fL (ref 80.0–100.0)
Platelets: 153 10*3/uL (ref 150–400)
RBC: 3.89 MIL/uL (ref 3.87–5.11)
RDW: 14.9 % (ref 11.5–15.5)
WBC: 3.6 10*3/uL — ABNORMAL LOW (ref 4.0–10.5)
nRBC: 0 % (ref 0.0–0.2)

## 2019-05-22 LAB — COMPREHENSIVE METABOLIC PANEL
ALT: 13 U/L (ref 0–44)
AST: 15 U/L (ref 15–41)
Albumin: 3.3 g/dL — ABNORMAL LOW (ref 3.5–5.0)
Alkaline Phosphatase: 43 U/L (ref 38–126)
Anion gap: 10 (ref 5–15)
BUN: 11 mg/dL (ref 8–23)
CO2: 28 mmol/L (ref 22–32)
Calcium: 9.2 mg/dL (ref 8.9–10.3)
Chloride: 101 mmol/L (ref 98–111)
Creatinine, Ser: 0.85 mg/dL (ref 0.44–1.00)
GFR calc Af Amer: >60 mL/min (ref >60)
GFR calc non Af Amer: >60 mL/min (ref >60)
Glucose, Bld: 82 mg/dL (ref 70–99)
Potassium: 3.9 mmol/L (ref 3.5–5.1)
Sodium: 139 mmol/L (ref 135–145)
Total Bilirubin: 0.4 mg/dL (ref 0.3–1.2)
Total Protein: 8.2 g/dL — ABNORMAL HIGH (ref 6.5–8.1)

## 2019-05-22 LAB — FERRITIN: Ferritin: 22 ng/mL (ref 11–307)

## 2019-05-22 LAB — IRON AND TIBC
Iron: 64 ug/dL (ref 28–170)
Saturation Ratios: 31 % (ref 10.4–31.8)
TIBC: 206 ug/dL — ABNORMAL LOW (ref 250–450)
UIBC: 142 ug/dL

## 2019-05-22 NOTE — Progress Notes (Signed)
[facility]  [address], [address] [place], [place] [address] Phone: [phone]  Fax: [phone]   Clinic Day:  05/27/2019  Referring physician: Barbaraann Boys, MD  Chief Complaint: Janet Duffy is a 86 y.o. female with iron deficiency anemia, leukopenia, and a monoclonal gammopathy who is seen for a 3 month assessment.   HPI: The patient was last seen in the hematology clinic on 02/21/2019. At that time, she was doing well.  She denied any bone pain or interval infections.  Exam was unremarkable.  CBC revealed a hematocrit of 37.0, hemoglobin 12.0, MCV 96.1, platelets 151,000, WBC 2800 with an ANC 1300.  Copper was 63 (low).  Anti-neutrophil antibodies were negative.  She continued on oral iron with OJ or vitamin C.   Patient was directed to start multivitamins that contained copper on 03/04/2019.   Labs followed: 03/25/2019: Hematocrit 37.4, hemoglobin 12.0, MCV 98.4, platelets 157,000, WBC 3,000.  05/22/2019: Hematocrit 38.5, hemoglobin 12.2, MCV 99.0, platelets 153,000, WBC 3,600. Ferritin was 22 with an iron saturation of 31% and a TIBC of 206. Total protein 8.2. Albumin 3.3. SPEP is pending.   During the interim, she has felt "ok". The patient reports that she does not go out much during the pandemic. She denies any bone pain. She has had no infections. She reports getting both COVID-19 vaccines. After the COVID vaccine she notes having a chill x 1 day.   She is eating well. She notes occasional joint pain related to arthritis. She notes floor bicycling exercises are helping improve her joint pain. She continues to take oral iron with OJ or vitamin C. She denies any abdominal symptoms. She continues to have insomnia for which she is taking 1/2 a Benadryl and 1 tablet extra strength Tylenol.    Her BP is elevated at 179/81 today. Patient reports she has not taken her BP medication this morning.    Past Medical History:  Diagnosis Date  . Connective  tissue disease ([place])   . DDD (degenerative disc disease), lumbar   . Diverticulitis   . Granuloma annulare   . HTN (hypertension)   . Osteoporosis     History reviewed. No pertinent surgical history.  History reviewed. No pertinent family history.  Social History:  reports that she has never smoked. She has never used smokeless tobacco. She reports that she does not drink alcohol or use drugs. She lives in [place]. The patient is alone today.  Allergies:  Allergies  Allergen Reactions  . Clindamycin/Lincomycin Anaphylaxis  . Codeine Nausea Only  . Erythromycin Anaphylaxis  . Ivp Dye [Iodinated Diagnostic Agents] Anaphylaxis  . Metrizamide Anaphylaxis  . Monosodium Glutamate Shortness Of Breath and Swelling    Tongue swelling  . Moxifloxacin Anaphylaxis  . Penicillins Anaphylaxis and Rash    .Has patient had a PCN reaction causing immediate rash, facial/tongue/throat swelling, SOB or lightheadedness with hypotension: Yes Has patient had a PCN reaction causing severe rash involving mucus membranes or skin necrosis: No Has patient had a PCN reaction that required hospitalization: Unknown Has patient had a PCN reaction occurring within the last 10 years: Yes If all of the above answers are "NO", then may proceed with Cephalosporin use.   . Tramadol     Patient discloses she "went crazy"  . Amlodipine Swelling  . Avelox [Moxifloxacin Hcl In Nacl]   . Shellfish Allergy Hives    seafood  . Zoster Vaccine Live Rash    Current Medications: Current Outpatient Medications  Medication Sig  Dispense Refill  . acetaminophen (TYLENOL) 325 MG tablet Take 2 tablets (650 mg total) by mouth every 6 (six) hours as needed for mild pain (or Fever >/= 101).    Marland Kitchen aspirin EC 81 MG EC tablet Take 1 tablet (81 mg total) by mouth daily. 30 tablet 0  . carvedilol (COREG) 25 MG tablet Take 25 mg by mouth 2 (two) times daily.    . Cholecalciferol (VITAMIN D3) 1000 units CAPS Take 1,000 Units by  mouth daily.    . ferrous sulfate 325 (65 FE) MG tablet Take 325 mg by mouth daily with breakfast.    . hydrALAZINE (APRESOLINE) 50 MG tablet Take 1 tablet (50 mg total) by mouth 2 (two) times daily. (Patient taking differently: Take 100 mg by mouth 2 (two) times daily. 50 mg mid day) 30 tablet 0  . lisinopril (PRINIVIL,ZESTRIL) 40 MG tablet Take 1 tablet (40 mg total) by mouth daily. 30 tablet 0  . vitamin B-12 (CYANOCOBALAMIN) 1000 MCG tablet Take 1,000 mcg by mouth daily.    . Wheat Dextrin (BENEFIBER) POWD Take 1 Dose by mouth daily.    . ondansetron (ZOFRAN ODT) 4 MG disintegrating tablet Take 1 tablet (4 mg total) by mouth every 8 (eight) hours as needed for nausea or vomiting. (Patient not taking: Reported on 05/23/2019) 20 tablet 0   No current facility-administered medications for this visit.    Review of Systems  Constitutional: Negative.  Negative for chills, diaphoresis, fever, malaise/fatigue and weight loss (stable).       Feels "ok".  HENT: Negative.  Negative for congestion, ear pain, hearing loss, nosebleeds, sinus pain and sore throat.   Eyes: Negative.  Negative for blurred vision, double vision and photophobia.  Respiratory: Negative.  Negative for cough, shortness of breath and wheezing.   Cardiovascular: Negative.  Negative for chest pain, palpitations, claudication, leg swelling and PND.  Gastrointestinal: Negative.  Negative for abdominal pain, blood in stool, constipation, diarrhea, melena, nausea and vomiting.       Eating well.  Genitourinary: Negative.  Negative for dysuria, frequency, hematuria and urgency.  Musculoskeletal: Positive for joint pain (arthritis, hands and knee; improving with floor exercises). Negative for back pain and myalgias.  Skin: Negative.  Negative for rash.  Neurological: Negative.  Negative for dizziness, tingling, sensory change, speech change, focal weakness, weakness and headaches.  Endo/Heme/Allergies: Negative.  Does not bruise/bleed  easily.       Raynaud's.  Psychiatric/Behavioral: Negative for depression, memory loss and substance abuse. The patient has insomnia. The patient is not nervous/anxious.   All other systems reviewed and are negative.  Performance status (ECOG): 1  Vitals Blood pressure (!) 179/81, pulse 68, temperature (!) 97.3 F (36.3 C), temperature source Tympanic, resp. rate 18, height 5' 3"  (1.6 m), weight 122 lb 7.5 oz (55.6 kg), SpO2 97 %.   Physical Exam  Constitutional: She is oriented to person, place, and time. She appears well-developed and well-nourished. No distress.  HENT:  Head: Normocephalic and atraumatic.  Mouth/Throat: Oropharynx is clear and moist. No oropharyngeal exudate.  Short styled gray hair.  Mask.  Eyes: Pupils are equal, round, and reactive to light. Conjunctivae and EOM are normal. No scleral icterus.  Blue eyes s/p cataract surgery.  Neck: No JVD present.  Cardiovascular: Normal rate, regular rhythm and normal heart sounds.  No murmur heard. Pulmonary/Chest: Effort normal and breath sounds normal. No respiratory distress. She has no wheezes. She has no rales.  Abdominal: Soft. Bowel sounds are normal.  She exhibits no distension and no mass. There is no abdominal tenderness. There is no rebound and no guarding.  Musculoskeletal:        General: No tenderness or edema. Normal range of motion.     Cervical back: Normal range of motion and neck supple.  Lymphadenopathy:       Head (right side): No preauricular, no posterior auricular and no occipital adenopathy present.       Head (left side): No preauricular, no posterior auricular and no occipital adenopathy present.    She has no cervical adenopathy.    She has no axillary adenopathy.       Right: No inguinal and no supraclavicular adenopathy present.       Left: No inguinal and no supraclavicular adenopathy present.  Neurological: She is alert and oriented to person, place, and time.  Skin: Skin is warm and dry. No  rash noted. She is not diaphoretic. No erythema. No pallor.  Psychiatric: She has a normal mood and affect. Her behavior is normal. Judgment and thought content normal.  Nursing note and vitals reviewed.   No visits with results within 3 Day(s) from this visit.  Latest known visit with results is:  Clinical Support on 05/22/2019  Component Date Value Ref Range Status  . Ferritin 05/22/2019 22  11 - 307 ng/mL Final   Performed at [facility], [address] [place], [place] [address]  . Sodium 05/22/2019 139  135 - 145 mmol/L Final  . Potassium 05/22/2019 3.9  3.5 - 5.1 mmol/L Final  . Chloride 05/22/2019 101  98 - 111 mmol/L Final  . CO2 05/22/2019 28  22 - 32 mmol/L Final  . Glucose, Bld 05/22/2019 82  70 - 99 mg/dL Final  . BUN 05/22/2019 11  8 - 23 mg/dL Final  . Creatinine, Ser 05/22/2019 0.85  0.44 - 1.00 mg/dL Final  . Calcium 05/22/2019 9.2  8.9 - 10.3 mg/dL Final  . Total Protein 05/22/2019 8.2* 6.5 - 8.1 g/dL Final  . Albumin 05/22/2019 3.3* 3.5 - 5.0 g/dL Final  . AST 05/22/2019 15  15 - 41 U/L Final  . ALT 05/22/2019 13  0 - 44 U/L Final  . Alkaline Phosphatase 05/22/2019 43  38 - 126 U/L Final  . Total Bilirubin 05/22/2019 0.4  0.3 - 1.2 mg/dL Final  . GFR calc non Af Amer 05/22/2019 >60  >60 mL/min Final  . GFR calc Af Amer 05/22/2019 >60  >60 mL/min Final  . Anion gap 05/22/2019 10  5 - 15 Final   Performed at [facility], [address], [place], [place] [address]  . WBC 05/22/2019 3.6* 4.0 - 10.5 K/uL Final  . RBC 05/22/2019 3.89  3.87 - 5.11 MIL/uL Final  . Hemoglobin 05/22/2019 12.2  12.0 - 15.0 g/dL Final  . HCT 05/22/2019 38.5  36.0 - 46.0 % Final  . MCV 05/22/2019 99.0  80.0 - 100.0 fL Final  . MCH 05/22/2019 31.4  26.0 - 34.0 pg Final  . MCHC 05/22/2019 31.7  30.0 - 36.0 g/dL Final  . RDW 05/22/2019 14.9  11.5 - 15.5 % Final  . Platelets 05/22/2019 153  150 - 400 K/uL Final  . nRBC 05/22/2019 0.0  0.0 - 0.2 % Final   Performed at [facility], [address], [place], [place] [address]  . Iron 05/22/2019 64  28 - 170 ug/dL Final  . TIBC 05/22/2019 206* 250 - 450 ug/dL Final  . Saturation  Ratios 05/22/2019 31  10.4 - 31.8 % Final  . UIBC 05/22/2019 142  ug/dL Final   Performed at [facility], [address] [place], [place] [address]    Assessment:  Janet Duffy is a 86 y.o. female withiron deficiency anemia, leukopenia, and an IgA monoclonal gammopathy.WBChas ranged from3,900 - 5,600 from 2007-2020.WBC was2,700 on 09/24/2018.  She has anundifferentiated connective tissue disease. She isfollowed by Dr Almeta Monas at [facility].She has a history of digital vascular insufficiency, embolus by arteriogram, positive rheumatoid factor (1:1280), elevated TPA inhibitor, flare of symmetrical polyarthritis 2002, elevated sed rate/CRP, monoclonal IgA lambda,and episodes of morning stiffness,andpolyarthralgiasimproved with steroids  She has a history of colitis/diverticulitsx 3. She doe snot have ulcerative colitis or Crohn's disease. She required 1 unit of PRBCs4 years ago for GI bleed.  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 Shehas had an elevated IgAsince 2007. IgAhas been followed: 2170 on 01/23/2006, 2340 on 03/02/2006,2470 on 10/14/2015, and 2491 on 10/26/2018. In10/16/2014,she was noted to have a biclonal gammopathy with an IgA lambda and a less intense IgG kappa monoclonal protein.  M spikehas been followed (gm/dL): 1.57 on02/09/2015,1.56on 03/02/2006,1.52 on 03/08/2007, 1.59 on 04/10/2008, 1.91 on 04/16/2009, 1.20on 07/19/2012,1.36 on 01/17/2013, 1.59 on07/08/2014, 1.65 on05/02/2016,2.15 on07/05/2018, 2.4 on 10/26/2018, and 2.4 on 02/18/2019.Kappa free light chains were 1.56, lambda free light chains 1.98,and ratio 0.79 on07/03/2016  Work-up on 07/24/2020revealed: hematocrit 33.2, hemoglobin 10.9, MCV 92.5, platelets  172,000, WBC 4,300 with an ANC of 1900. Differential was unremarkable. Protein was8.5, albumin 3.0, and calcium 8.9. Retic countwas0.6%. Iron saturationwas29%with aTIBC 244.B12was79(low) and folatenormal.IgA2491 (high). M-proteinwas2.4gm/dL IgA monoclonal protein with lambda light chain specificity. Kappa light chain were21.5, lambda light chain 20.8, ratio 1.03(normal).24 hour UPEPrevealed 167 mg/24 hours of protein. There was no monoclonal protein with kappa free light chains 7.70, lambda free light chains <0.65 and ratio >11.85 (1.03-31.76). Flow cytometryshowed 23% of T cells with loss of CD7 (seen in both reactive and neoplastic processes). There was nomonoclonal B cell population.  PET scanon 11/15/2018 revealed no evidence of myeloma or plasmacytoma.  Bone marrow biopsyon 11/19/2018 revealed a hypercellular marrow for age (40%). Myeloid and erythroid elementswerepresent in normal proportions. Megakaryocytes demonstrateda spectrum of maturation without clustering. Therewereno atypical lymphoid aggregates (CD20, CD3) or granulomas. CD138 highlightedan increased plasma cell population(~10%) which were lambda restricted. A subset of the plasma cells also expressedCD56. Differential included anon-IgM monoclonal gammopathy of undetermined significance (MGUS)and plasma cell myeloma.  She has iron deficiency.Ferritinhas been followed: 38 on 04/16/2009, 11 on 05/13/2010, 3 on 10/07/2014, 8 on 09/24/2018, 21 on 02/18/2019, and 31 on 05/22/2019.  Labs on 09/24/2018:hematocrit 35.9, hemoglobin 12.0, MCV 90, platelets 152,000, WBC 2,700(ANC 1,400).Differential included 53% segs, 29% lymphs, 12% monocytes and 5% eosinophils. Creatinine 0.8.Calcium 9.3. Protein 8.8 (high)and albumin 2.9 (low).TSHwas 2.91.HIV, hepatitis B (surface antigen, core antibody total, surface antibody) and hepatitis C were negative on07/03/2016.  Copper was 63 (low)  and anti-neutrophil antibodies were negative on 02/21/2019.  She has B12 deficiency. She has pernicious anemia.  Anti-parietal cell antibodies and intrinsic factor antibodies were elevated on 11/29/2018.  B12 was 79 on 10/26/2018.She is on oral B12. B12 was 405 on 11/29/2018.  She has received both of her COVID-19 vaccines.  Symptomatically, she is doing "ok".  She denies any excess brusing or bleeding, interim infections or bone pain.  Exam is stable.  Plan: 1.   Review labs from 05/22/2019.  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 Bone marrow on 11/19/2018 revealed 10% plasma cells which were lambda restricted. M spike reveals a 2.4gm/dLIgA monoclonal protein with lambda light chain specific specificity. 24-hour UPEP revealed no M spike but excess free lambda light chains. She has no CRAB criteria.  M-spike was 2.3 gm/dL on 05/22/2019 (stable to improved).             Criteria for non IgM MGUS: M-spike <3 gm/dL. <10% plasma cells in the marrow. No CRAB criteria. Criteria for asymptomatic smoldering myeloma M spike >3 gm/dL. >= 10% to <60% plasma cells in the marrow. No CRAB criteria Continue surveillance every 3 months. 3. Iron deficiency anemia Hematocrit38.5. Hemoglobin 12.2. MCV 99.0 on 05/22/2019.   Ferritinwas22 on 05/22/2019.  She denies any melena, hematochezia, hematuria or vaginal bleeding. Continue oral iron with OJ or vitamin C. 4.Leukopenia [id number] 05/22/2019 (improved). Patient has an undifferentiated connective tissue disorder. She denies any new  medications or herbal products. Copper level was 62 (slightyl low).  Anti-neutrophil antibodies were negative.             Overall WBC improving with copper supplementation. 5.B12 deficiency B12 was 79 on07/24/2020and 405 on 11/29/2018. Shebegan oral B12 on07/28/2020.             She has pernicious anemia. Monitor B12 and folate annually. 6.   Copper deficiency  Copper was 63 (low) on 02/21/2019.  She is on a MVI with copper.  Check copper level with next lab draw. 7.   RTC in 3 months for labs (CCB with diff, CMP, SPEP, copper level). 8.   RTC in 6 months for MD assessment and labs (CBC with diff, CMP, SPEP, ferritin- day before).  I discussed the assessment and treatment plan with the patient.  The patient was provided an opportunity to ask questions and all were answered.  The patient agreed with the plan and demonstrated an understanding of the instructions.  The patient was advised to call back if the symptoms worsen or if the condition fails to improve as anticipated.   Lequita Asal, MD, PhD    05/27/2019, 9:00 AM  I, Selena Batten, am acting as scribe for [employer]. Mike Gip, MD, PhD.  I, Melissa C. Mike Gip, MD, have reviewed the above documentation for accuracy and completeness, and I agree with the above.

## 2019-05-23 ENCOUNTER — Encounter: Payer: Self-pay | Admitting: Hematology and Oncology

## 2019-05-23 NOTE — Progress Notes (Signed)
No new changes noted today. The patient name and DOB has been verified by phone today. 

## 2019-05-26 DIAGNOSIS — E610 Copper deficiency: Secondary | ICD-10-CM | POA: Insufficient documentation

## 2019-05-27 ENCOUNTER — Encounter: Payer: Self-pay | Admitting: Hematology and Oncology

## 2019-05-27 ENCOUNTER — Other Ambulatory Visit: Payer: Self-pay

## 2019-05-27 ENCOUNTER — Inpatient Hospital Stay: Payer: Medicare HMO | Admitting: Hematology and Oncology

## 2019-05-27 VITALS — BP 179/81 | HR 68 | Temp 97.3°F | Resp 18 | Ht 63.0 in | Wt 122.5 lb

## 2019-05-27 DIAGNOSIS — C9000 Multiple myeloma not having achieved remission: Secondary | ICD-10-CM | POA: Diagnosis not present

## 2019-05-27 DIAGNOSIS — D509 Iron deficiency anemia, unspecified: Secondary | ICD-10-CM | POA: Diagnosis not present

## 2019-05-27 DIAGNOSIS — E538 Deficiency of other specified B group vitamins: Secondary | ICD-10-CM

## 2019-05-27 DIAGNOSIS — D72819 Decreased white blood cell count, unspecified: Secondary | ICD-10-CM

## 2019-05-27 DIAGNOSIS — D472 Monoclonal gammopathy: Secondary | ICD-10-CM

## 2019-05-27 DIAGNOSIS — E610 Copper deficiency: Secondary | ICD-10-CM

## 2019-05-27 DIAGNOSIS — D510 Vitamin B12 deficiency anemia due to intrinsic factor deficiency: Secondary | ICD-10-CM | POA: Diagnosis not present

## 2019-05-27 LAB — PROTEIN ELECTROPHORESIS, SERUM
A/G Ratio: 0.8 (ref 0.7–1.7)
Albumin ELP: 3.6 g/dL (ref 2.9–4.4)
Alpha-1-Globulin: 0.2 g/dL (ref 0.0–0.4)
Alpha-2-Globulin: 0.6 g/dL (ref 0.4–1.0)
Beta Globulin: 3.7 g/dL — ABNORMAL HIGH (ref 0.7–1.3)
Gamma Globulin: 0.1 g/dL — ABNORMAL LOW (ref 0.4–1.8)
Globulin, Total: 4.6 g/dL — ABNORMAL HIGH (ref 2.2–3.9)
M-Spike, %: 2.3 g/dL — ABNORMAL HIGH
Total Protein ELP: 8.2 g/dL (ref 6.0–8.5)

## 2019-08-26 ENCOUNTER — Other Ambulatory Visit: Payer: Self-pay

## 2019-08-26 ENCOUNTER — Inpatient Hospital Stay: Payer: Medicare HMO | Attending: Hematology and Oncology

## 2019-08-26 DIAGNOSIS — E610 Copper deficiency: Secondary | ICD-10-CM

## 2019-08-26 DIAGNOSIS — D472 Monoclonal gammopathy: Secondary | ICD-10-CM | POA: Insufficient documentation

## 2019-08-26 DIAGNOSIS — D72819 Decreased white blood cell count, unspecified: Secondary | ICD-10-CM

## 2019-08-26 DIAGNOSIS — D509 Iron deficiency anemia, unspecified: Secondary | ICD-10-CM | POA: Diagnosis not present

## 2019-08-26 LAB — CBC WITH DIFFERENTIAL/PLATELET
Abs Immature Granulocytes: 0 10*3/uL (ref 0.00–0.07)
Basophils Absolute: 0 10*3/uL (ref 0.0–0.1)
Basophils Relative: 1 %
Eosinophils Absolute: 0.2 10*3/uL (ref 0.0–0.5)
Eosinophils Relative: 6 %
HCT: 35 % — ABNORMAL LOW (ref 36.0–46.0)
Hemoglobin: 11.8 g/dL — ABNORMAL LOW (ref 12.0–15.0)
Immature Granulocytes: 0 %
Lymphocytes Relative: 31 %
Lymphs Abs: 0.9 10*3/uL (ref 0.7–4.0)
MCH: 32 pg (ref 26.0–34.0)
MCHC: 33.7 g/dL (ref 30.0–36.0)
MCV: 94.9 fL (ref 80.0–100.0)
Monocytes Absolute: 0.3 10*3/uL (ref 0.1–1.0)
Monocytes Relative: 12 %
Neutro Abs: 1.4 10*3/uL — ABNORMAL LOW (ref 1.7–7.7)
Neutrophils Relative %: 50 %
Platelets: 162 10*3/uL (ref 150–400)
RBC: 3.69 MIL/uL — ABNORMAL LOW (ref 3.87–5.11)
RDW: 14.5 % (ref 11.5–15.5)
WBC: 2.8 10*3/uL — ABNORMAL LOW (ref 4.0–10.5)
nRBC: 0 % (ref 0.0–0.2)

## 2019-08-26 LAB — COMPREHENSIVE METABOLIC PANEL
ALT: 13 U/L (ref 0–44)
AST: 17 U/L (ref 15–41)
Albumin: 3.1 g/dL — ABNORMAL LOW (ref 3.5–5.0)
Alkaline Phosphatase: 42 U/L (ref 38–126)
Anion gap: 9 (ref 5–15)
BUN: 12 mg/dL (ref 8–23)
CO2: 30 mmol/L (ref 22–32)
Calcium: 9.1 mg/dL (ref 8.9–10.3)
Chloride: 98 mmol/L (ref 98–111)
Creatinine, Ser: 0.9 mg/dL (ref 0.44–1.00)
GFR calc Af Amer: >60 mL/min (ref >60)
GFR calc non Af Amer: 58 mL/min — ABNORMAL LOW (ref >60)
Glucose, Bld: 99 mg/dL (ref 70–99)
Potassium: 3.9 mmol/L (ref 3.5–5.1)
Sodium: 137 mmol/L (ref 135–145)
Total Bilirubin: 0.6 mg/dL (ref 0.3–1.2)
Total Protein: 7.9 g/dL (ref 6.5–8.1)

## 2019-08-27 ENCOUNTER — Encounter: Payer: Self-pay | Admitting: Hematology and Oncology

## 2019-08-27 LAB — PROTEIN ELECTROPHORESIS, SERUM
A/G Ratio: 0.8 (ref 0.7–1.7)
Albumin ELP: 3.4 g/dL (ref 2.9–4.4)
Alpha-1-Globulin: 0.2 g/dL (ref 0.0–0.4)
Alpha-2-Globulin: 0.7 g/dL (ref 0.4–1.0)
Beta Globulin: 3.6 g/dL — ABNORMAL HIGH (ref 0.7–1.3)
Gamma Globulin: 0.1 g/dL — ABNORMAL LOW (ref 0.4–1.8)
Globulin, Total: 4.5 g/dL — ABNORMAL HIGH (ref 2.2–3.9)
M-Spike, %: 2.5 g/dL — ABNORMAL HIGH
Total Protein ELP: 7.9 g/dL (ref 6.0–8.5)

## 2019-08-28 LAB — COPPER, SERUM: Copper: 72 ug/dL — ABNORMAL LOW (ref 80–158)

## 2019-09-03 NOTE — Progress Notes (Signed)
[facility]  [address], [address] [place], [place] [address] Phone: [phone]  Fax: [phone]   Clinic Day:  09/05/2019  Referring physician: Barbaraann Boys, MD  Chief Complaint: Janet Duffy is a 86 y.o. female with iron deficiency anemia, leukopenia, and a monoclonal gammopathy who is seen for a 3 month assessment.   HPI: The patient was last seen in the hematology clinic on 05/27/2019. At that time, she was doing "ok". She denied any excess brusing or bleeding, interim infections or bone pain. Exam was stable.  Hematocrit was 38.5, hemoglobin 12.2, platelets 153,000, WBC 3,600. Ferritin was 22 with an iron saturation was 31% with a TIBC of 206. Total protein was 8.2 and albumin 3.3.  M spike was 2.3 gm/dL.  Surveillance continued.  She saw Dr. Inda Merlin on 08/20/2019. She was doing well. She had minimal use of Tylenol and topical gel for osteoarthritis pain. She was active. Her energy was good. Patient will have a follow up in 1 year.   Labs on 08/26/2019 showed hematocrit 35.0, hemoglobin 11.8, platelets 162,000, WBC 2,800 (ANC 1,400). Albumin was 3.1. Copper serum was 72 (80-158).  M spike was 2.5 gm/dL.   During the interim, she was feeling ok. Her energy level is fair. She is doing all the things she would like to do. She has a good appetite. She has no nausea, vomiting, or diarrhea. She is active and doing more house hold chores. She had joint pain secondary to arthritis. Pain radiates from left hip down to toes which she attributes to sciatica. She feels like the left side of her groin is swollen at times. She has ongoing insomnia. At night is takes half a benadryl and tylenol extra strength. She denies any mouth sores. She is not on any new mediation or herbal products.      Her copper serum is 72. Patient is taking a multi vitamin with copper. I directed her to call the clinic to let us know the exact amount of copper that is in the multi-vitamin.  She remains on oral B12 and iron. She had the second covid vaccine on 05/21/2019. I encouraged her to still follow the safety guidelines. Her BP was 193/91 today. She reports her blood pressure fluctuates and she is taking her medication as prescribed. She took her blood pressure cuff to [facility] and compared it to their monitor and was told that her cuff was in good condition.   She denies bleeding of any kind. She may not have another endoscopy or colonoscopy due to her age. She is followed by Noah Charon, PA in GI at [facility].   Past Medical History:  Diagnosis Date  . Connective tissue disease ([place])   . DDD (degenerative disc disease), lumbar   . Diverticulitis   . Granuloma annulare   . HTN (hypertension)   . Osteoporosis     History reviewed. No pertinent surgical history.  History reviewed. No pertinent family history.  Social History:  reports that she has never smoked. She has never used smokeless tobacco. She reports that she does not drink alcohol or use drugs. She lives in [place]. The patient is alone today.  Allergies:  Allergies  Allergen Reactions  . Clindamycin/Lincomycin Anaphylaxis  . Codeine Nausea Only  . Erythromycin Anaphylaxis  . Ivp Dye [Iodinated Diagnostic Agents] Anaphylaxis  . Metrizamide Anaphylaxis  . Monosodium Glutamate Shortness Of Breath and Swelling    Tongue swelling  . Moxifloxacin Anaphylaxis  . Penicillins Anaphylaxis and Rash    .  Has patient had a PCN reaction causing immediate rash, facial/tongue/throat swelling, SOB or lightheadedness with hypotension: Yes Has patient had a PCN reaction causing severe rash involving mucus membranes or skin necrosis: No Has patient had a PCN reaction that required hospitalization: Unknown Has patient had a PCN reaction occurring within the last 10 years: Yes If all of the above answers are "NO", then may proceed with Cephalosporin use.   . Tramadol     Patient discloses she "went crazy"  . Amlodipine  Swelling  . Avelox [Moxifloxacin Hcl In Nacl]   . Shellfish Allergy Hives    seafood  . Zoster Vaccine Live Rash    Current Medications: Current Outpatient Medications  Medication Sig Dispense Refill  . acetaminophen (TYLENOL) 325 MG tablet Take 2 tablets (650 mg total) by mouth every 6 (six) hours as needed for mild pain (or Fever >/= 101).    Marland Kitchen aspirin EC 81 MG EC tablet Take 1 tablet (81 mg total) by mouth daily. 30 tablet 0  . carvedilol (COREG) 25 MG tablet Take 25 mg by mouth 2 (two) times daily.    . Cholecalciferol (VITAMIN D3) 1000 units CAPS Take 1,000 Units by mouth daily.    . ferrous sulfate 325 (65 FE) MG tablet Take 325 mg by mouth daily with breakfast.    . hydrALAZINE (APRESOLINE) 50 MG tablet Take 1 tablet (50 mg total) by mouth 2 (two) times daily. (Patient taking differently: Take 100 mg by mouth 2 (two) times daily. 50 mg mid day) 30 tablet 0  . lisinopril (PRINIVIL,ZESTRIL) 40 MG tablet Take 1 tablet (40 mg total) by mouth daily. 30 tablet 0  . ondansetron (ZOFRAN ODT) 4 MG disintegrating tablet Take 1 tablet (4 mg total) by mouth every 8 (eight) hours as needed for nausea or vomiting. 20 tablet 0  . vitamin B-12 (CYANOCOBALAMIN) 1000 MCG tablet Take 1,000 mcg by mouth daily.    . Wheat Dextrin (BENEFIBER) POWD Take 1 Dose by mouth daily.     No current facility-administered medications for this visit.    Review of Systems  Constitutional: Positive for weight loss (2 lbs). Negative for chills, diaphoresis, fever and malaise/fatigue.       Feeling ok. Energy is fair.  HENT: Negative.  Negative for congestion, ear pain, hearing loss, nosebleeds, sinus pain and sore throat.   Eyes: Negative.  Negative for blurred vision, double vision and photophobia.  Respiratory: Negative.  Negative for cough, shortness of breath and wheezing.   Cardiovascular: Negative.  Negative for chest pain, palpitations, claudication, leg swelling and PND.  Gastrointestinal: Negative.   Negative for abdominal pain, blood in stool, constipation, diarrhea, melena, nausea and vomiting.       Good appetite.  Genitourinary: Negative.  Negative for dysuria, frequency, hematuria and urgency.  Musculoskeletal: Positive for joint pain (arthritis, hands and knee; pain radiates from left hip to toes). Negative for back pain and myalgias.       Left sided groin swollen at times. Sciatica.  Skin: Negative.  Negative for rash.  Neurological: Negative.  Negative for dizziness, tingling, sensory change, speech change, focal weakness, weakness and headaches.  Endo/Heme/Allergies: Negative.  Does not bruise/bleed easily.       Raynaud's.  Psychiatric/Behavioral: Negative for depression, memory loss and substance abuse. The patient has insomnia (ongoing). The patient is not nervous/anxious.   All other systems reviewed and are negative.  Performance status (ECOG): 1  Vitals Blood pressure (!) 193/91, pulse 60, temperature (!) 96.7 F (  35.9 C), temperature source Tympanic, weight 120 lb 0.7 oz (54.5 kg), SpO2 99 %.   Physical Exam  Constitutional: She is oriented to person, place, and time. She appears well-developed and well-nourished. No distress.  HENT:  Head: Normocephalic and atraumatic.  Mouth/Throat: Oropharynx is clear and moist. No oropharyngeal exudate.  Short styled gray hair.  Mask.  Eyes: Pupils are equal, round, and reactive to light. Conjunctivae and EOM are normal. No scleral icterus.  Blue eyes s/p cataract surgery.  Neck: No JVD present.  Cardiovascular: Normal rate, regular rhythm and normal heart sounds.  No murmur heard. Pulmonary/Chest: Effort normal and breath sounds normal. No respiratory distress. She has no wheezes. She has no rales.  Abdominal: Soft. Bowel sounds are normal. She exhibits no distension and no mass. There is no abdominal tenderness. There is no rebound and no guarding.  Musculoskeletal:        General: No edema. Normal range of motion.      Cervical back: Normal range of motion and neck supple.     Lumbar back: Tenderness (lower back) present.  Lymphadenopathy:       Head (right side): No preauricular, no posterior auricular and no occipital adenopathy present.       Head (left side): No preauricular, no posterior auricular and no occipital adenopathy present.    She has no cervical adenopathy.    She has no axillary adenopathy.       Right: No supraclavicular adenopathy present.       Left: No supraclavicular adenopathy present.  Neurological: She is alert and oriented to person, place, and time.  Skin: Skin is warm and dry. No rash noted. She is not diaphoretic. No erythema. No pallor.  Psychiatric: She has a normal mood and affect. Her behavior is normal. Judgment and thought content normal.  Nursing note and vitals reviewed.   No visits with results within 3 Day(s) from this visit.  Latest known visit with results is:  Appointment on 08/26/2019  Component Date Value Ref Range Status  . Copper 08/26/2019 72* 80 - 158 ug/dL Final   Comment: (NOTE) This test was developed and its performance characteristics determined by [facility]. It has not been cleared or approved by the Food and Drug Administration.                                    Detection Limit = 5              **Please note reference interval change** Performed At: [facility] [place], [place] [id number] Rush Farmer MD [id number]   . Total Protein ELP 08/26/2019 7.9  6.0 - 8.5 g/dL Final  . Albumin ELP 08/26/2019 3.4  2.9 - 4.4 g/dL Final  . Alpha-1-Globulin 08/26/2019 0.2  0.0 - 0.4 g/dL Final  . Alpha-2-Globulin 08/26/2019 0.7  0.4 - 1.0 g/dL Final  . Beta Globulin 08/26/2019 3.6* 0.7 - 1.3 g/dL Final  . Gamma Globulin 08/26/2019 0.1* 0.4 - 1.8 g/dL Final  . M-Spike, % 08/26/2019 2.5* Not Observed g/dL Final  . SPE Interp. 08/26/2019 Comment   Final   Comment: (NOTE) The SPE pattern demonstrates a single peak (M-spike) in  the beta region which may represent monoclonal protein. This peak may also be caused by the presence of fibrinogen or circulating immune complexes. If clinically indicated, the presence of a monoclonal gammopathy may be confirmed by serum immunofixation.  Performed At: [facility] [place], [place] [id number] Rush Farmer MD [id number]   . Comment 08/26/2019 Comment   Final   Comment: (NOTE) Protein electrophoresis scan will follow via computer, mail, or courier delivery.   . Globulin, Total 08/26/2019 4.5* 2.2 - 3.9 g/dL Corrected  . A/G Ratio 08/26/2019 0.8  0.7 - 1.7 Corrected  . Sodium 08/26/2019 137  135 - 145 mmol/L Final  . Potassium 08/26/2019 3.9  3.5 - 5.1 mmol/L Final  . Chloride 08/26/2019 98  98 - 111 mmol/L Final  . CO2 08/26/2019 30  22 - 32 mmol/L Final  . Glucose, Bld 08/26/2019 99  70 - 99 mg/dL Final   Glucose reference range applies only to samples taken after fasting for at least 8 hours.  . BUN 08/26/2019 12  8 - 23 mg/dL Final  . Creatinine, Ser 08/26/2019 0.90  0.44 - 1.00 mg/dL Final  . Calcium 08/26/2019 9.1  8.9 - 10.3 mg/dL Final  . Total Protein 08/26/2019 7.9  6.5 - 8.1 g/dL Final  . Albumin 08/26/2019 3.1* 3.5 - 5.0 g/dL Final  . AST 08/26/2019 17  15 - 41 U/L Final  . ALT 08/26/2019 13  0 - 44 U/L Final  . Alkaline Phosphatase 08/26/2019 42  38 - 126 U/L Final  . Total Bilirubin 08/26/2019 0.6  0.3 - 1.2 mg/dL Final  . GFR calc non Af Amer 08/26/2019 58* >60 mL/min Final  . GFR calc Af Amer 08/26/2019 >60  >60 mL/min Final  . Anion gap 08/26/2019 9  5 - 15 Final   Performed at [facility], [address], [place], [place] [address]  . WBC 08/26/2019 2.8* 4.0 - 10.5 K/uL Final  . RBC 08/26/2019 3.69* 3.87 - 5.11 MIL/uL Final  . Hemoglobin 08/26/2019 11.8* 12.0 - 15.0 g/dL Final  . HCT 08/26/2019 35.0* 36.0 - 46.0 % Final  . MCV 08/26/2019 94.9  80.0 - 100.0 fL Final  . MCH 08/26/2019 32.0  26.0 - 34.0 pg Final   . MCHC 08/26/2019 33.7  30.0 - 36.0 g/dL Final  . RDW 08/26/2019 14.5  11.5 - 15.5 % Final  . Platelets 08/26/2019 162  150 - 400 K/uL Final  . nRBC 08/26/2019 0.0  0.0 - 0.2 % Final  . Neutrophils Relative % 08/26/2019 50  % Final  . Neutro Abs 08/26/2019 1.4* 1.7 - 7.7 K/uL Final  . Lymphocytes Relative 08/26/2019 31  % Final  . Lymphs Abs 08/26/2019 0.9  0.7 - 4.0 K/uL Final  . Monocytes Relative 08/26/2019 12  % Final  . Monocytes Absolute 08/26/2019 0.3  0.1 - 1.0 K/uL Final  . Eosinophils Relative 08/26/2019 6  % Final  . Eosinophils Absolute 08/26/2019 0.2  0.0 - 0.5 K/uL Final  . Basophils Relative 08/26/2019 1  % Final  . Basophils Absolute 08/26/2019 0.0  0.0 - 0.1 K/uL Final  . Immature Granulocytes 08/26/2019 0  % Final  . Abs Immature Granulocytes 08/26/2019 0.00  0.00 - 0.07 K/uL Final   Performed at [facility], [address] [place], [place] [address]    Assessment:  Janet Duffy is a 86 y.o. female withiron deficiency anemia, leukopenia, and an IgA monoclonal gammopathy.WBChas ranged from3,900 - 5,600 from 2007-2020.WBC was2,700 on 09/24/2018.  She has anundifferentiated connective tissue disease. She isfollowed by Dr Almeta [facility] at [facility].She has a history of digital vascular insufficiency, embolus by arteriogram, positive rheumatoid factor (1:1280), elevated TPA inhibitor, flare of symmetrical  polyarthritis 2002, elevated sed rate/CRP, monoclonal IgA lambda,and episodes of morning stiffness,andpolyarthralgiasimproved with steroids  She has a history of colitis/diverticulitsx 3. She does not have ulcerative colitis or Crohn's disease. She required 1 unit of PRBCs4 years ago for GI bleed.  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 Shehas had an elevated IgAsince 2007. IgAhas been followed: 2170 on 01/23/2006, 2340 on 03/02/2006,2470 on 10/14/2015, and 2491 on 10/26/2018.  In10/16/2014,she was noted to have a biclonal gammopathy with an IgA lambda and a less intense IgG kappa monoclonal protein.  M spikehas been followed (gm/dL): 1.57 on02/09/2015,1.56on 03/02/2006,1.52 on 03/08/2007, 1.59 on 04/10/2008, 1.91 on 04/16/2009, 1.20on 07/19/2012,1.36 on 01/17/2013, 1.59 on07/08/2014, 1.65 on05/02/2016,2.15 on07/05/2018, 2.4 on 10/26/2018, 2.4 on 02/18/2019, 2.3 on 05/22/2019, and 2.5 on 08/26/2019.Kappa free light chains were 1.56, lambda free light chains 1.98,and ratio 0.79 on07/03/2016.  Work-up on 07/24/2020revealed: hematocrit 33.2, hemoglobin 10.9, MCV 92.5, platelets 172,000, WBC 4,300 with an ANC of 1900. Differential was unremarkable. Protein was8.5, albumin 3.0, and calcium 8.9. Retic countwas0.6%. Iron saturationwas29%with aTIBC 244.B12was79(low) and folatenormal.IgA2491 (high). M-proteinwas2.4gm/dL IgA monoclonal protein with lambda light chain specificity. Kappa light chain were21.5, lambda light chain 20.8, ratio 1.03(normal).24 hour UPEPrevealed 167 mg/24 hours of protein. There was no monoclonal protein with kappa free light chains 7.70, lambda free light chains <0.65 and ratio >11.85 (1.03-31.76). Flow cytometryshowed 23% of T cells with loss of CD7 (seen in both reactive and neoplastic processes). There was nomonoclonal B cell population.  PET scanon 11/15/2018 revealed no evidence of myeloma or plasmacytoma.  Bone marrow biopsyon 11/19/2018 revealed a hypercellular marrow for age (40%). Myeloid and erythroid elementswerepresent in normal proportions. Megakaryocytes demonstrateda spectrum of maturation without clustering. Therewereno atypical lymphoid aggregates (CD20, CD3) or granulomas. CD138 highlightedan increased plasma cell population(~10%) which were lambda restricted. A subset of the plasma cells also expressedCD56. Differential included anon-IgM monoclonal gammopathy of undetermined  significance (MGUS)and plasma cell myeloma.  She has iron deficiency.Ferritinhas been followed: 38 on 04/16/2009, 11 on 05/13/2010, 3 on 10/07/2014, 8 on 09/24/2018, 21 on 02/18/2019, and 31 on 05/22/2019.  Labs on 09/24/2018:hematocrit 35.9, hemoglobin 12.0, MCV 90, platelets 152,000, WBC 2,700(ANC 1,400).Differential included 53% segs, 29% lymphs, 12% monocytes and 5% eosinophils. Creatinine 0.8.Calcium 9.3. Protein 8.8 (high)and albumin 2.9 (low).TSHwas 2.91.HIV, hepatitis B (surface antigen, core antibody total, surface antibody) and hepatitis C were negative on07/03/2016.  Copper was 63 (low) and anti-neutrophil antibodies were negative on 02/21/2019.  She has B12 deficiency. She has pernicious anemia.  Anti-parietal cell antibodies and intrinsic factor antibodies were elevated on 11/29/2018.  B12 was 79 on 10/26/2018.She is on oral B12. B12 was 405 on 11/29/2018.  She has received both of her COVID-19 vaccines.  Symptomatically, she was feeling ok. Her energy level is fair.  Exam is stable.  Plan: 1.   Review labs form 08/26/2019. 2.IgA monoclonal gammopathy with lambda light chain specificity PET scan on 11/15/2018 revealed no lytic bone lesions. Bone marrow on 11/19/2018 revealed 10% plasma cells which were lambda restricted. M spike reveals a 2.4gm/dLIgA monoclonal protein with lambda light chain specific specificity. 24-hour UPEP revealed no M spike but excess free lambda light chains. She has no CRAB criteria.  M-spike was 2.3 gm/dL on 05/22/2019 (stable to improved).             Criteria for non IgM MGUS: M-spike <3 gm/dL. <10% plasma cells in the marrow. No CRAB criteria. Criteria for asymptomatic smoldering myeloma M spike >3 gm/dL. >=  10% to <60% plasma  cells in the marrow. No CRAB criteria Continue surveillance every 3 months. 3. Iron deficiency anemia Hematocrit35.0. Hemoglobin 11.8. MCV 94.9 on 08/26/2019.   Ferritin32 today.  She denies any melena, hematochezia, hematuria or vaginal bleeding. Continue oral iron with OJ or vitamin C. 4.Leukopenia [id number] 08/26/2019. Patient has an undifferentiated connective tissue disorder. She denies any new medications or herbal products. Copper level was 72 (slightyl low).  Anti-neutrophil antibodies were negative.             Overall WBC improving with copper supplementation. 5.B12 deficiency B12 was 79 on07/24/2020and 1336 today. Shebegan oral B12 on07/28/2020.             She has pernicious anemia. Monitor B12 and folate annually. 6.   Copper deficiency  Copper was 72 (low) on 08/26/2019.  She is on a MVI with copper. 7.   Guaiac cards x 3. 8.   Extra labs today. 9.   Peripheral smear for path review. 10.   Patient to call RN with dose of copper she is taking. 11.   RTC in 3 months for MD assessment and labs (CBC with diff +/- others).  I discussed the assessment and treatment plan with the patient.  The patient was provided an opportunity to ask questions and all were answered.  The patient agreed with the plan and demonstrated an understanding of the instructions.  The patient was advised to call back if the symptoms worsen or if the condition fails to improve as anticipated.  I provided 20 minutes of face-to-face time during this this encounter and > 50% was spent counseling as documented under my assessment and plan.  I provided these services from the [facility] office.   Lequita Asal, MD, PhD    09/05/2019, 11:57 AM  I, Selena Batten, am acting as scribe for [employer]. Mike Gip, MD,  PhD.  I, Ying Blankenhorn C. Mike Gip, MD, have reviewed the above documentation for accuracy and completeness, and I agree with the above.

## 2019-09-04 NOTE — Progress Notes (Signed)
Called patient to go over pre screening questions. Patient denied any complications at the moment.

## 2019-09-05 ENCOUNTER — Telehealth: Payer: Self-pay | Admitting: *Deleted

## 2019-09-05 ENCOUNTER — Inpatient Hospital Stay: Payer: Medicare HMO

## 2019-09-05 ENCOUNTER — Inpatient Hospital Stay: Payer: Medicare HMO | Attending: Hematology and Oncology | Admitting: Hematology and Oncology

## 2019-09-05 ENCOUNTER — Other Ambulatory Visit: Payer: Self-pay

## 2019-09-05 ENCOUNTER — Encounter: Payer: Self-pay | Admitting: Hematology and Oncology

## 2019-09-05 VITALS — BP 193/91 | HR 60 | Temp 96.7°F | Wt 120.0 lb

## 2019-09-05 DIAGNOSIS — D509 Iron deficiency anemia, unspecified: Secondary | ICD-10-CM | POA: Diagnosis not present

## 2019-09-05 DIAGNOSIS — D472 Monoclonal gammopathy: Secondary | ICD-10-CM

## 2019-09-05 DIAGNOSIS — Z79899 Other long term (current) drug therapy: Secondary | ICD-10-CM | POA: Insufficient documentation

## 2019-09-05 DIAGNOSIS — D473 Essential (hemorrhagic) thrombocythemia: Secondary | ICD-10-CM | POA: Diagnosis present

## 2019-09-05 DIAGNOSIS — D72819 Decreased white blood cell count, unspecified: Secondary | ICD-10-CM | POA: Insufficient documentation

## 2019-09-05 DIAGNOSIS — E538 Deficiency of other specified B group vitamins: Secondary | ICD-10-CM

## 2019-09-05 DIAGNOSIS — E610 Copper deficiency: Secondary | ICD-10-CM

## 2019-09-05 LAB — TSH: TSH: 4.19 u[IU]/mL (ref 0.350–4.500)

## 2019-09-05 LAB — FOLATE: Folate: 48 ng/mL (ref >5.9)

## 2019-09-05 LAB — FERRITIN: Ferritin: 32 ng/mL (ref 11–307)

## 2019-09-05 LAB — PATHOLOGIST SMEAR REVIEW

## 2019-09-05 LAB — VITAMIN B12: Vitamin B-12: 1336 pg/mL — ABNORMAL HIGH (ref 180–914)

## 2019-09-05 NOTE — Patient Instructions (Signed)
  Neutropenic precautions.  Notify clinic if any fever (100.4 or higher), chills or feeling sick.

## 2019-09-05 NOTE — Telephone Encounter (Signed)
Patient called and said that the amount of Copper in her MVI is 0.5 mg and that she takes 1 tablet per day

## 2019-09-06 DIAGNOSIS — D473 Essential (hemorrhagic) thrombocythemia: Secondary | ICD-10-CM | POA: Diagnosis not present

## 2019-09-07 DIAGNOSIS — D473 Essential (hemorrhagic) thrombocythemia: Secondary | ICD-10-CM | POA: Diagnosis not present

## 2019-09-09 ENCOUNTER — Other Ambulatory Visit: Payer: Self-pay

## 2019-09-09 DIAGNOSIS — D509 Iron deficiency anemia, unspecified: Secondary | ICD-10-CM

## 2019-09-09 LAB — OCCULT BLOOD X 1 CARD TO LAB, STOOL
Fecal Occult Bld: NEGATIVE
Fecal Occult Bld: NEGATIVE
Fecal Occult Bld: NEGATIVE

## 2019-09-19 ENCOUNTER — Telehealth: Payer: Self-pay | Admitting: *Deleted

## 2019-09-19 ENCOUNTER — Telehealth: Payer: Self-pay | Admitting: Hematology and Oncology

## 2019-09-19 NOTE — Telephone Encounter (Signed)
Patient called asking for the results of the added labs drawn. She states she sees that her B12 level is high and is asking if she needs to make any changes. Please return her call [phone]

## 2019-09-19 NOTE — Telephone Encounter (Signed)
Re:  Labs  Patient called to review all recent labs.  Ferritin was slightly low.  We discussed continuation of oral iron and iron rich foods. She denies any bleeding.  Guaiac cards were negative.  We discussed her B12 level.  B12 was slightly high on B12 1000 mcg po q day.  We discussed decreasing her B12 to 4-5 days a week.  We discussed her copper level.  Copper level was slightly low.  She was able to check her MVI pills.  Copper was 56% of RDA. We discussed finding copper containing MVI with 100% RDA.  We discussed her SPEP and change in level in 2020 when her bone marrow was performed.  Level appears stable from 2020 to present. We will continue to monitor closely.  She mentioned some recent back pain without trauma.  If pain persists, she is to contact the clinic for follow-up imaging.  We reviewed neutropenic precautions.   Lequita Asal, MD

## 2019-09-20 ENCOUNTER — Telehealth: Payer: Self-pay

## 2019-09-20 NOTE — Telephone Encounter (Signed)
Pending message from yesterday.

## 2019-10-08 ENCOUNTER — Telehealth: Payer: Self-pay

## 2019-10-08 ENCOUNTER — Telehealth: Payer: Self-pay | Admitting: *Deleted

## 2019-10-08 NOTE — Telephone Encounter (Signed)
  Please call to discuss vitamin levels with patient.  M

## 2019-10-08 NOTE — Telephone Encounter (Signed)
Patient called with questions regarding her copper and MVI tablets. She states Dr Mike Gip wanted her to find 100 % copper tablets which she has found and it says to take twice a day. But her MVI has 56% daily recommended dose of copper in it. She is asking how she should take this. Please return her call [phone]

## 2019-10-08 NOTE — Telephone Encounter (Signed)
Spoke with the patient to inform her that per Dr Mike Gip she need to get MVI with 100% copper in it. The patient express she can bot find the right medication at this time. I have informed Dr Mike Gip.

## 2019-11-05 IMAGING — CT CT HEAD W/O CM
3 series · 15 of 47 positions shown, 18 images · non-contrast
Comparison: None.

CLINICAL DATA: Speech difficulties.

EXAM:
CT HEAD WITHOUT CONTRAST
TECHNIQUE: Contiguous axial images were obtained from the base of the skull
through the vertex without intravenous contrast.

[Series 2: head wo · axial · 0.47mm/px · z∈[-148,-18]mm · 9 of 32 slices shown, 12 images]
[im 3/32  brain]
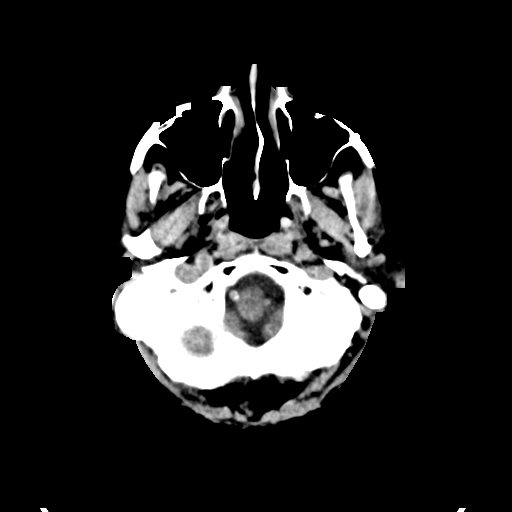
[im 3/32  bone]
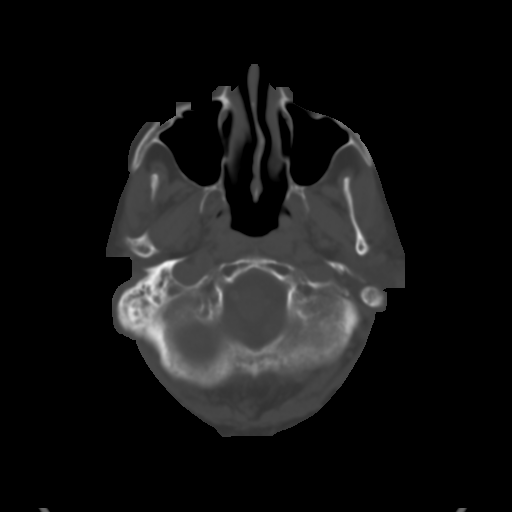
[im 6/32  brain]
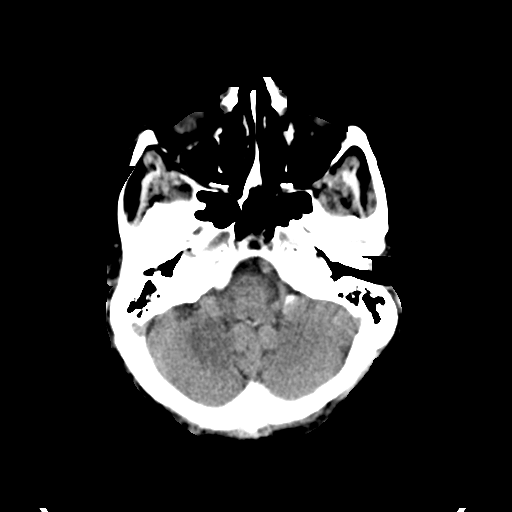
[im 9/32  brain]
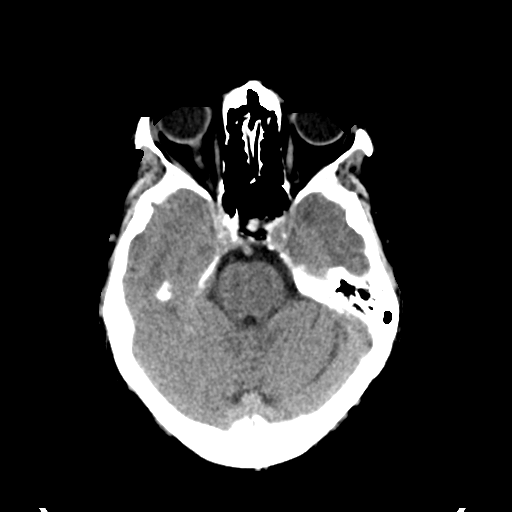
[im 12/32  brain]
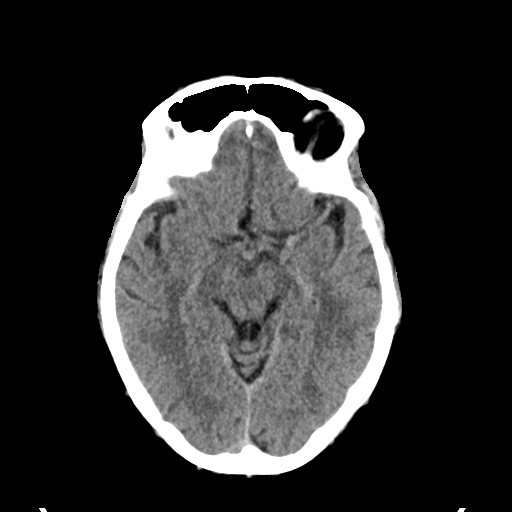
[im 17/32  brain]
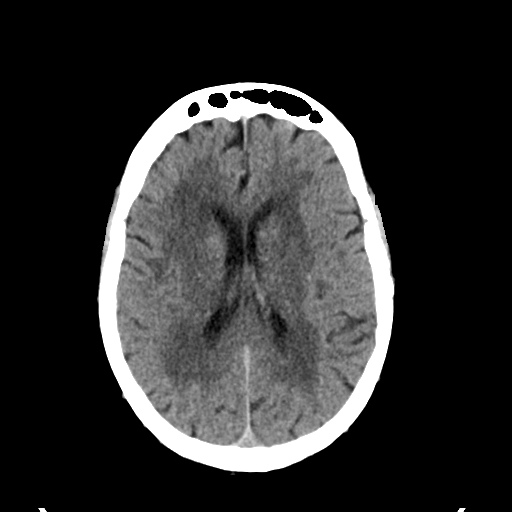
[im 17/32  bone]
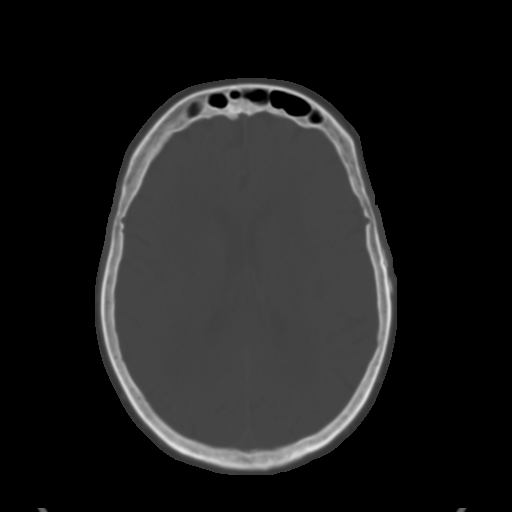
[im 20/32  brain]
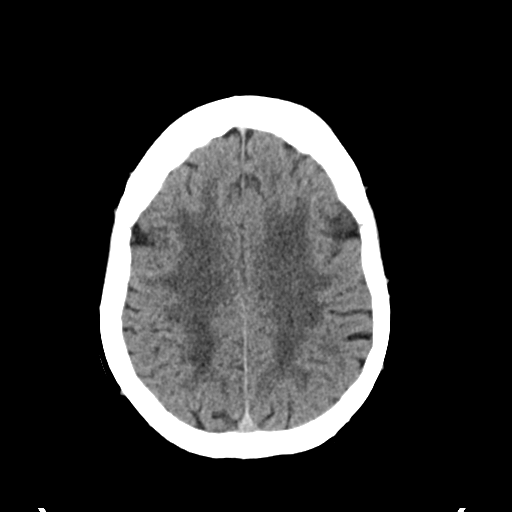
[im 23/32  brain]
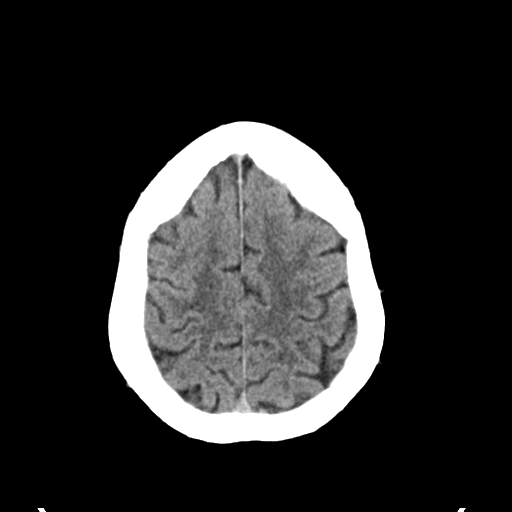
[im 26/32  brain]
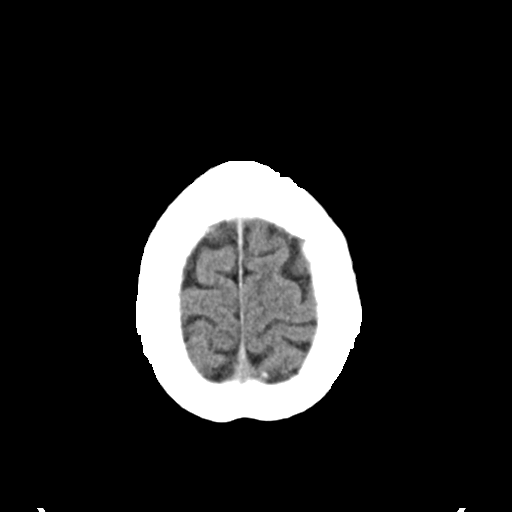
[im 29/32  brain]
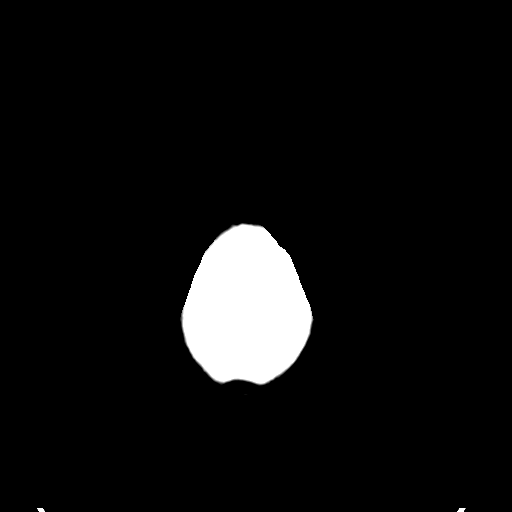
[im 29/32  bone]
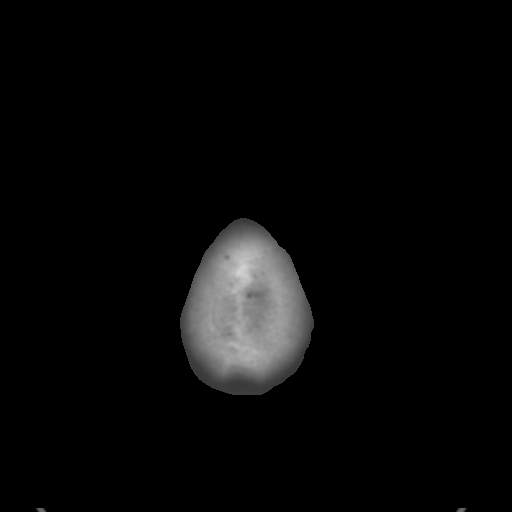

[Series 4: coronal soft tissue · coronal · 0.30mm/px · 3 of 71 slices shown]
[im 24/71  brain]
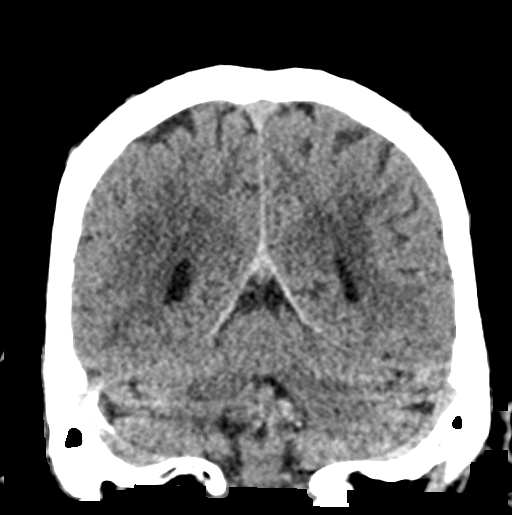
[im 32/71  brain]
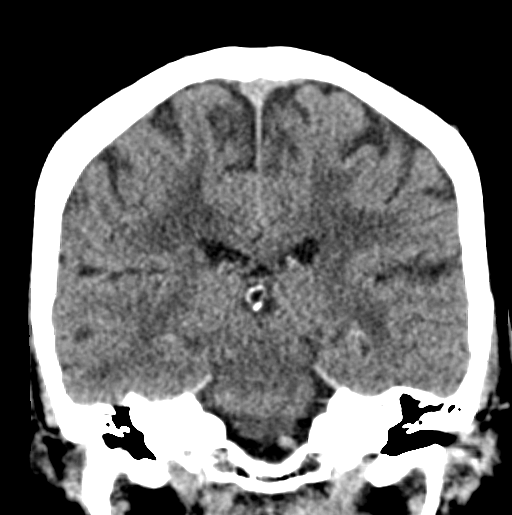
[im 39/71  brain]
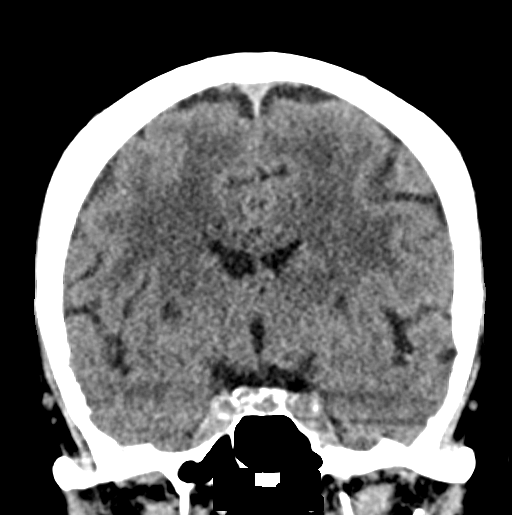

[Series 5: sagittal soft tissue · sagittal · 0.30mm/px · 3 of 51 slices shown]
[im 17/51  brain]
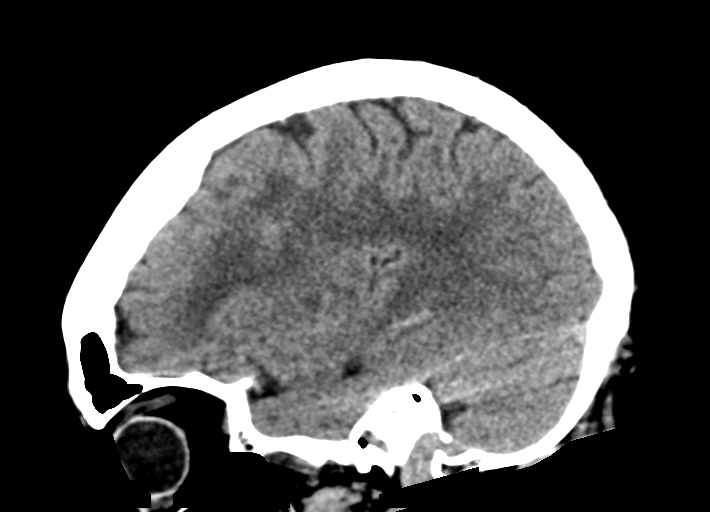
[im 26/51  brain]
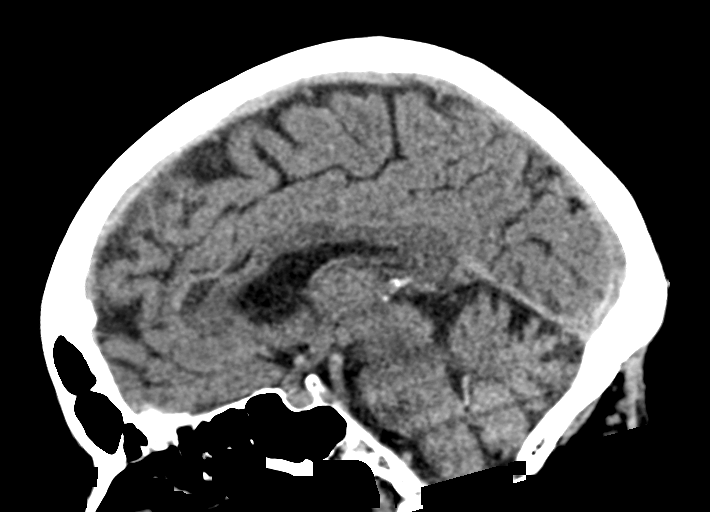
[im 34/51  brain]
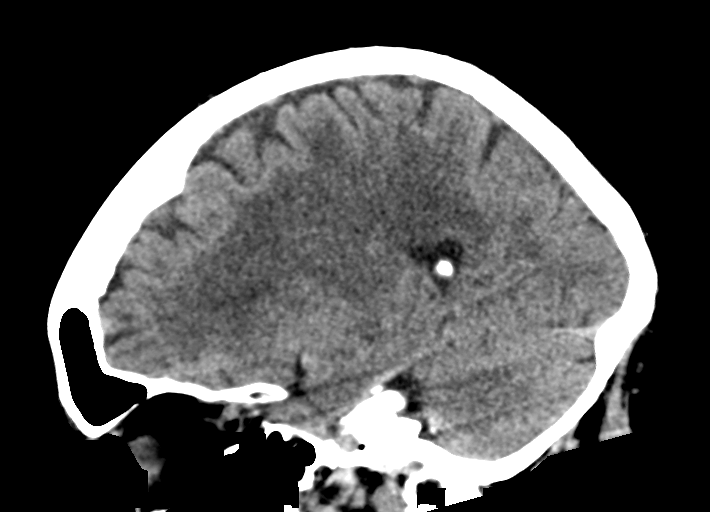

[15 of 47 positions shown; findings below may reference images not displayed]

FINDINGS: Brain: Mild chronic ischemic white matter disease is noted. Probable
old lacunar infarction seen in right basal ganglia. No mass effect
or midline shift is noted. Ventricular size is within normal limits.
There is no evidence of mass lesion, hemorrhage or acute infarction.

Vascular: No hyperdense vessel or unexpected calcification.

Skull: Normal. Negative for fracture or focal lesion.

Sinuses/Orbits: No acute finding.

Other: None.
IMPRESSION: Mild chronic ischemic white matter disease. No acute intracranial
abnormality seen.

## 2019-11-05 IMAGING — DX DG CHEST 1V PORT
1 series · 1 of 1 positions shown · non-contrast
Comparison: Radiograph March 16, 2017.

CLINICAL DATA: Back pain.  Hypertension.

EXAM:
PORTABLE CHEST 1 VIEW

[chest ap]
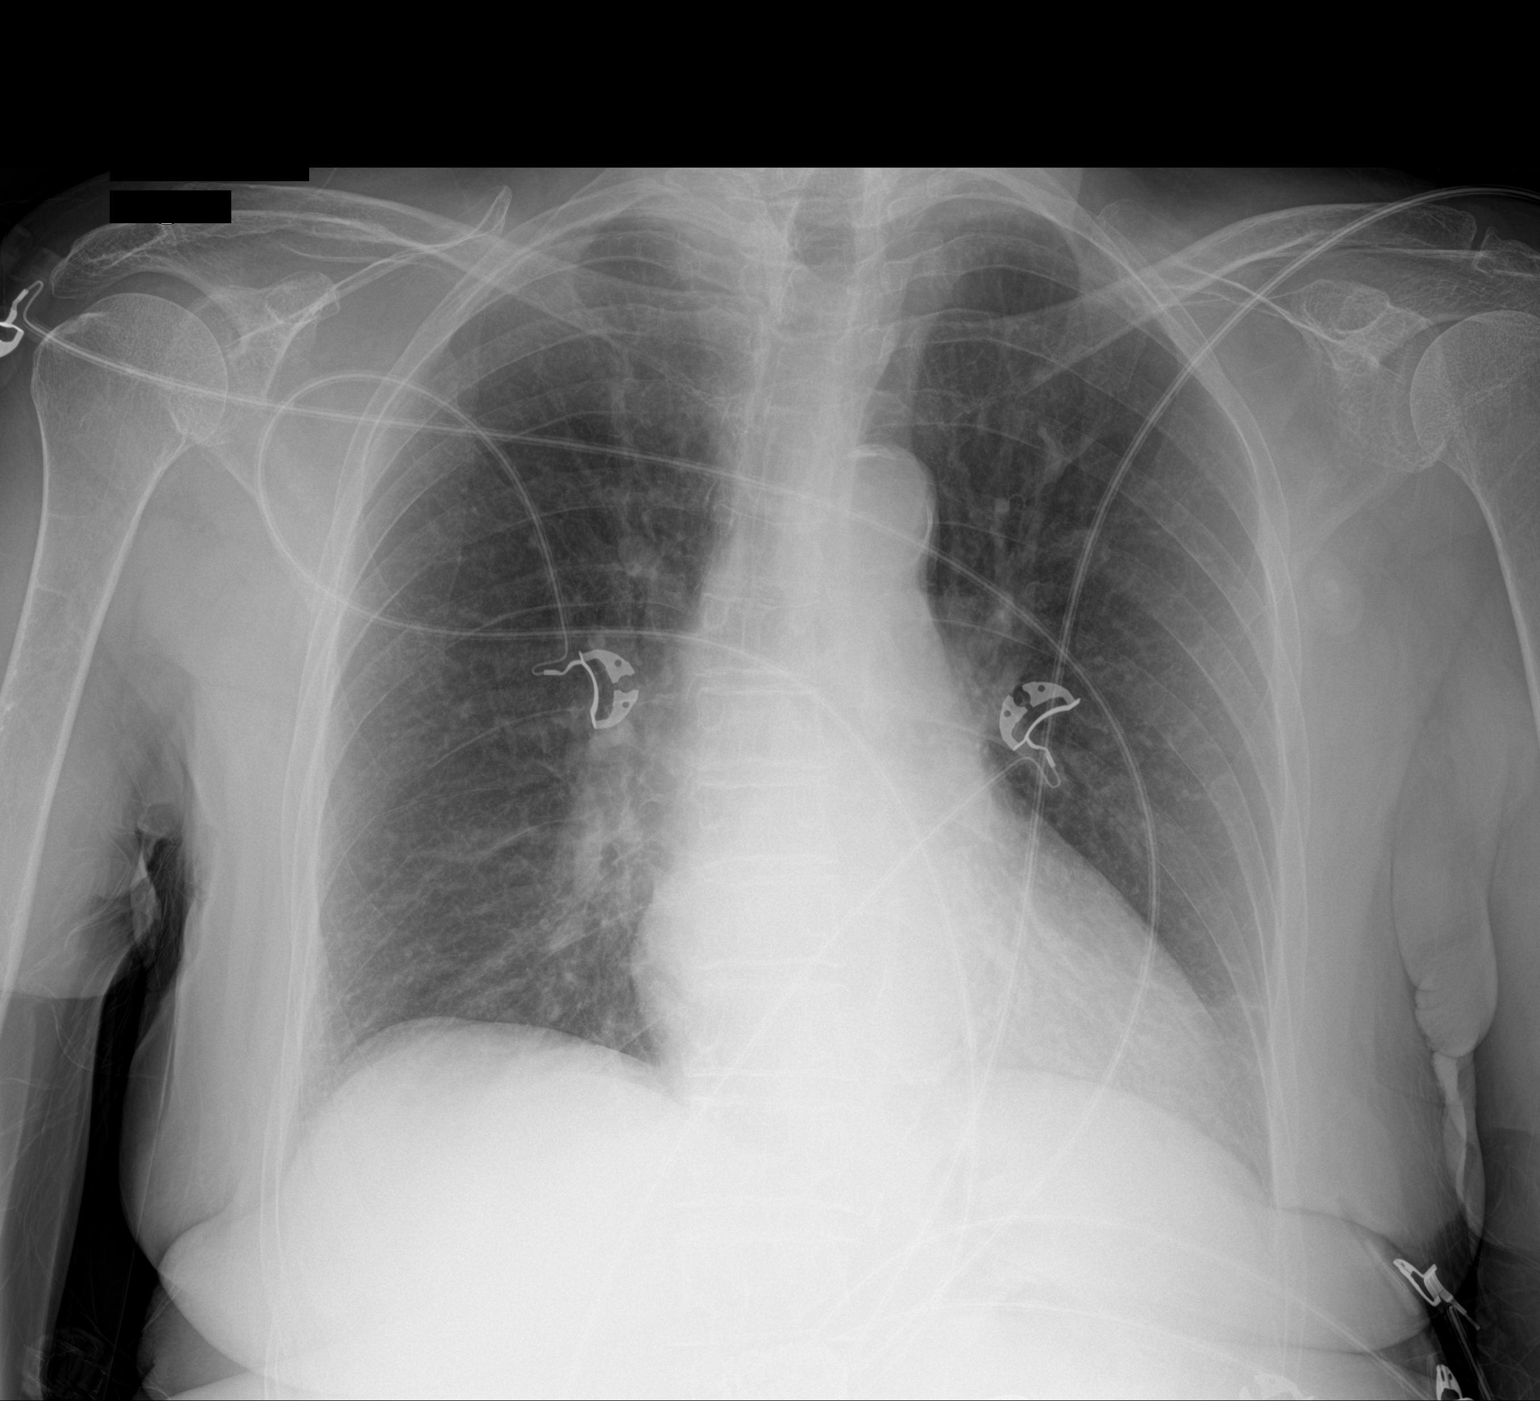

[1 of 1 positions shown; findings below may reference images not displayed]

FINDINGS: The heart size and mediastinal contours are within normal limits.
Both lungs are clear. No pneumothorax or pleural effusion is noted.
The visualized skeletal structures are unremarkable.
IMPRESSION: No active disease.

## 2019-11-05 IMAGING — CR DG CERVICAL SPINE COMPLETE 4+V
6 series · 7 of 7 positions shown · non-contrast
Comparison: None.

CLINICAL DATA: Acute onset of posterior neck pain.

EXAM:
CERVICAL SPINE - COMPLETE 4+ VIEW

[c-spine lat]
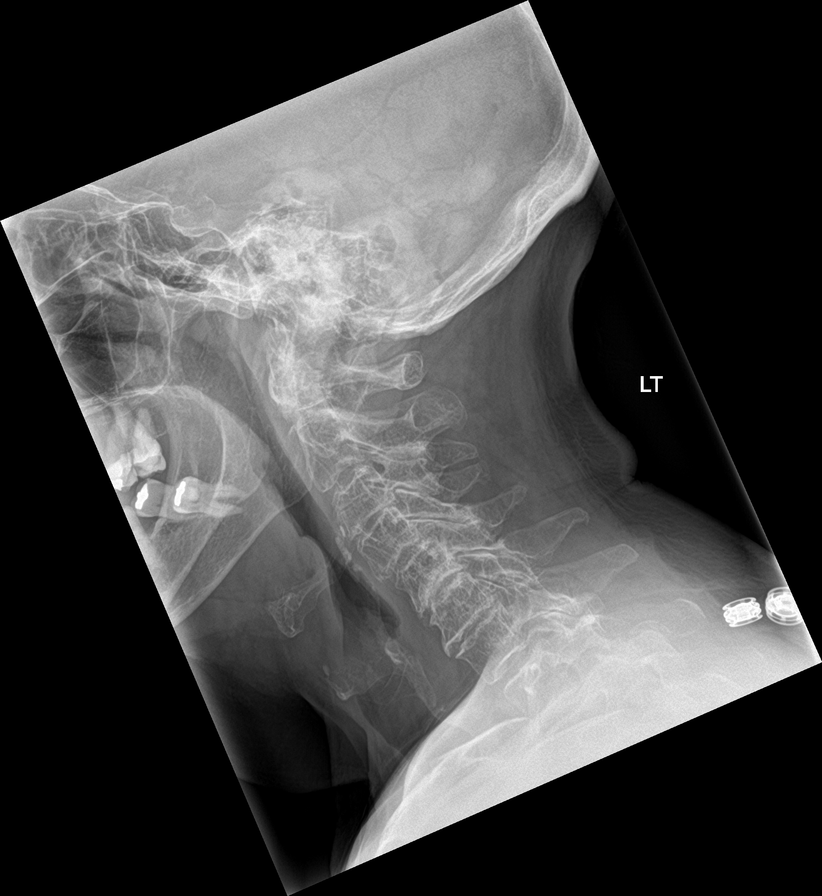

[c-spine obl (1 of 2)]
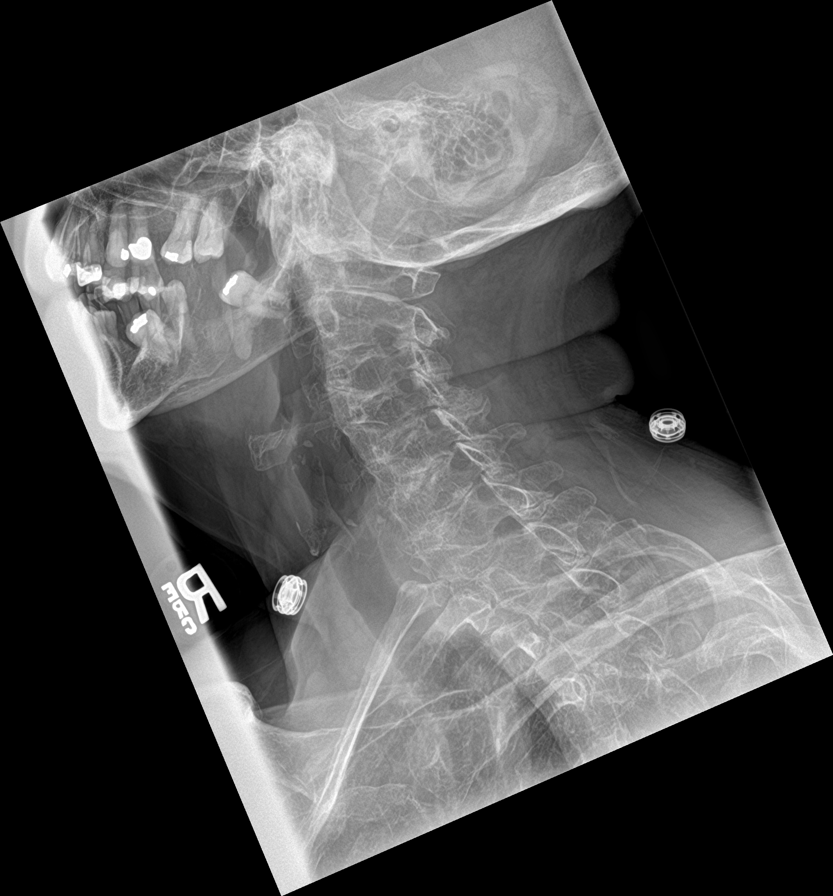

[Series 3: c-spine obl · 0.14mm/px · 2 of 2 slices shown (2 of 2)]
[im 1/2]
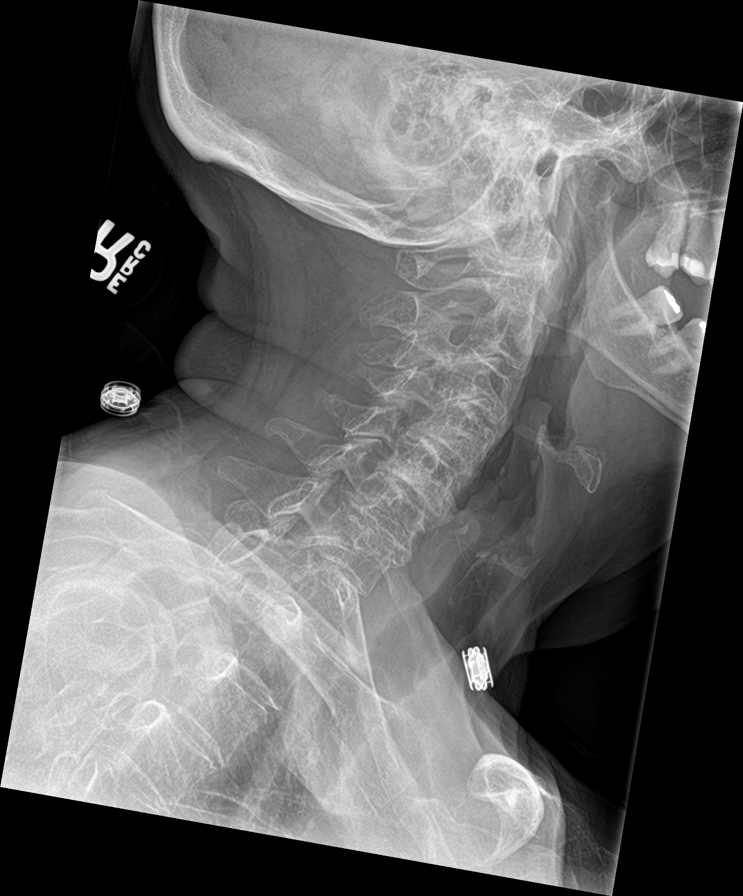
[im 2/2]
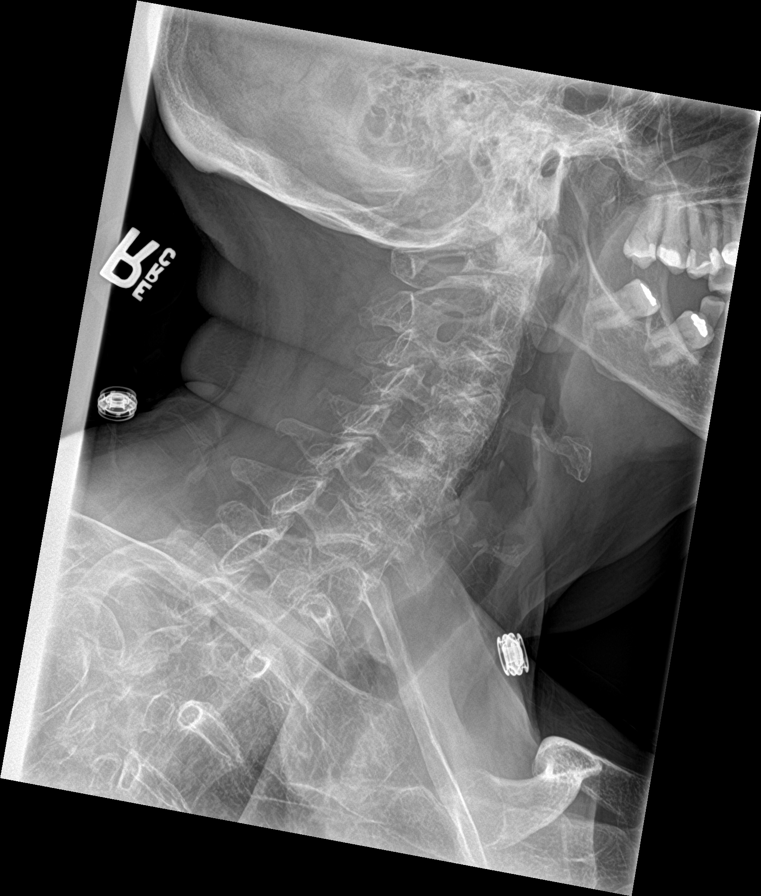

[c-spine ap]
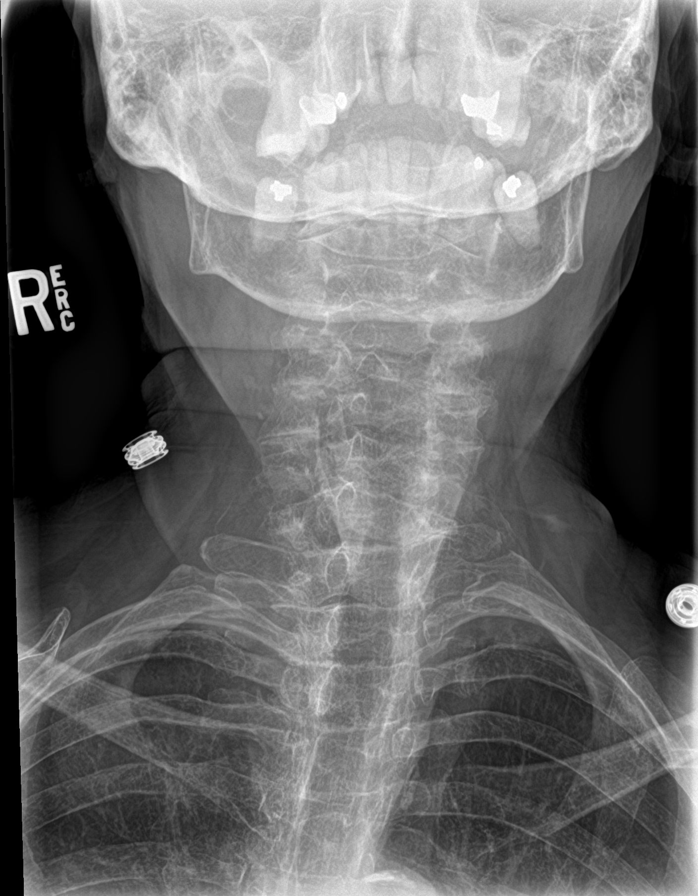

[c-spine open mouth]
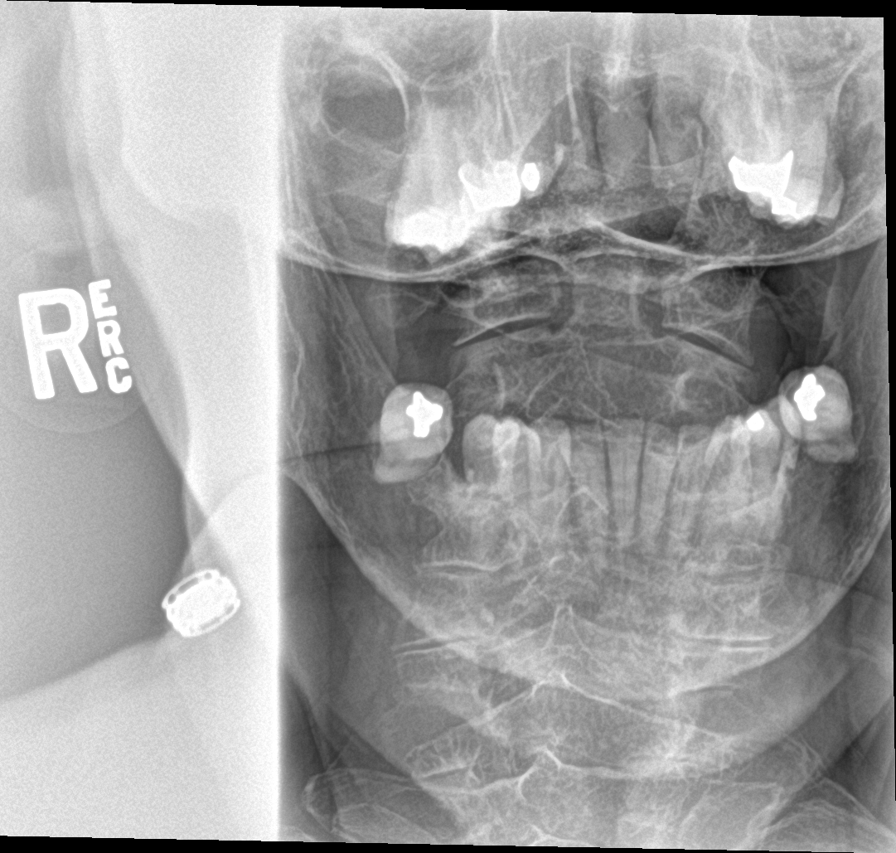

[[person_name]]
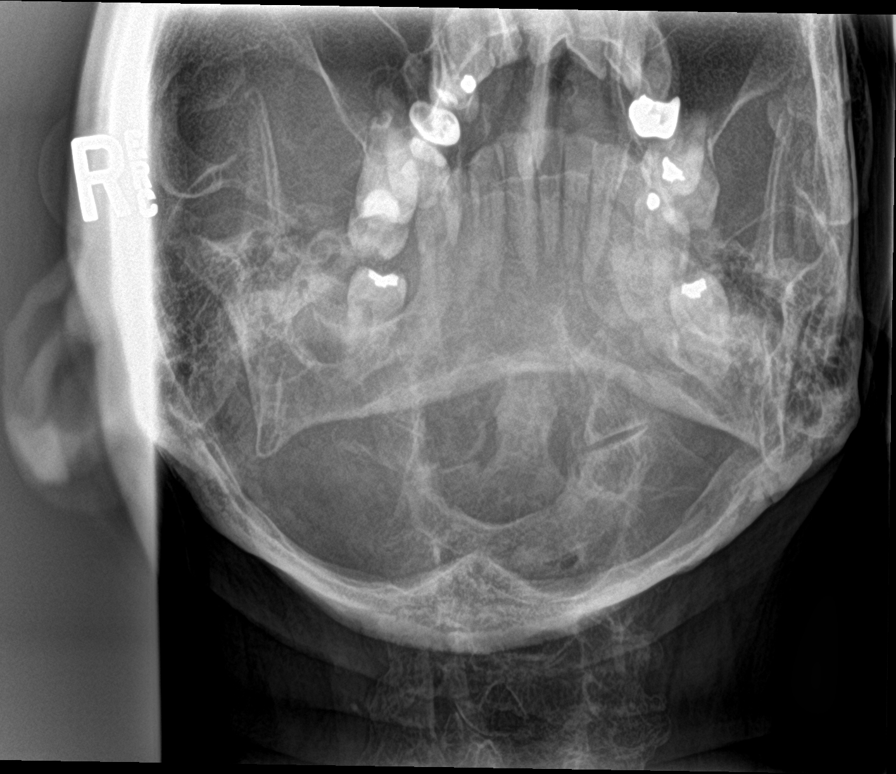

[7 of 7 positions shown; findings below may reference images not displayed]

FINDINGS: There is no evidence of fracture. There is mild grade 1
retrolisthesis of C3 on C4, and grade 1 anterolisthesis of C4 on C5,
reflecting underlying facet disease. Mild endplate degenerative
change is noted along the lower cervical spine, with small anterior
and posterior disc osteophyte complexes.

Vertebral bodies demonstrate normal height. Prevertebral soft
tissues are within normal limits. The provided odontoid view
demonstrates no significant abnormality.

The visualized lung apices are clear.
IMPRESSION: 1. No evidence of fracture or subluxation along the cervical spine.
2. Mild degenerative change along the cervical spine.

## 2019-11-06 IMAGING — US US CAROTID DUPLEX BILAT
1 series · 13 of 24 positions shown · non-contrast
Comparison: Brain MRI obtained earlier today

CLINICAL DATA: 86-year-old female with left arm weakness

EXAM:
BILATERAL CAROTID DUPLEX ULTRASOUND
TECHNIQUE: Gray scale imaging, color Doppler and duplex ultrasound were
performed of bilateral carotid and vertebral arteries in the neck.

[Series 1: us carotid duplex bilat · 13 of 66 slices shown]
[im 1/66]
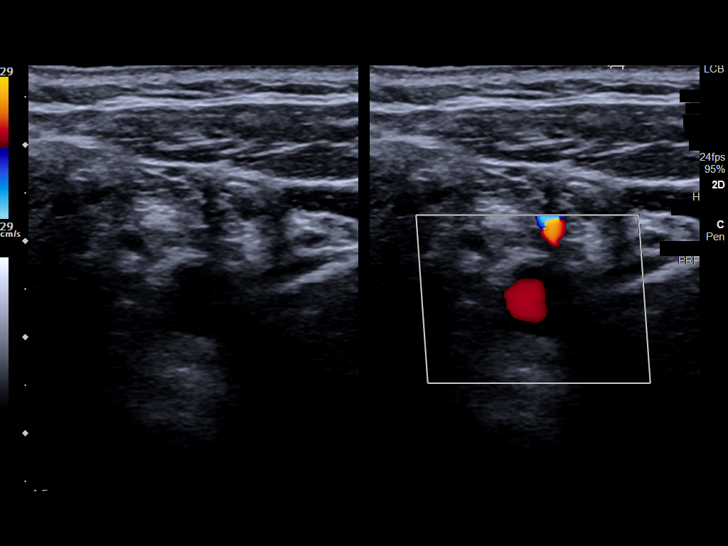
[im 6/66]
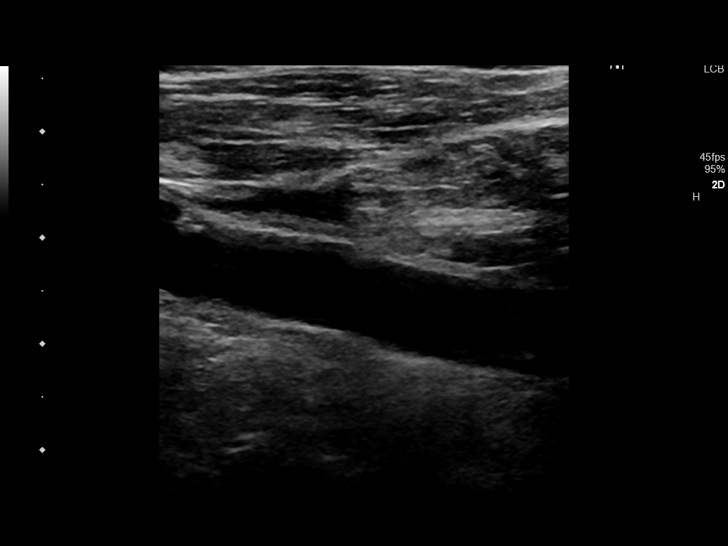
[im 12/66]
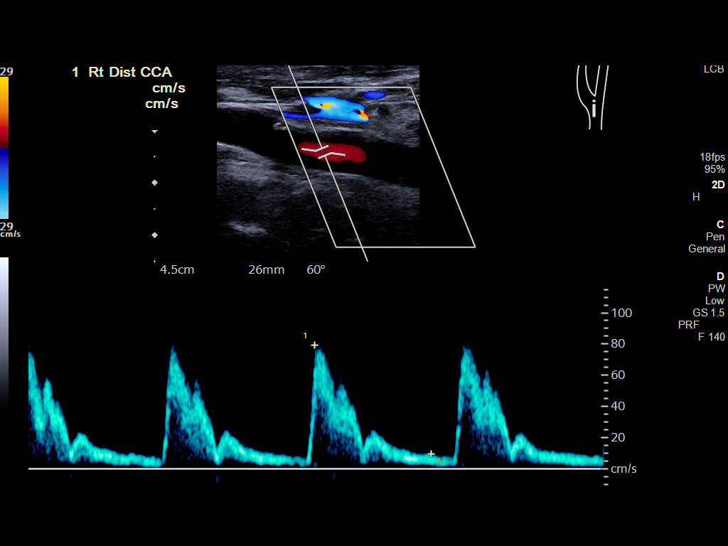
[im 17/66]
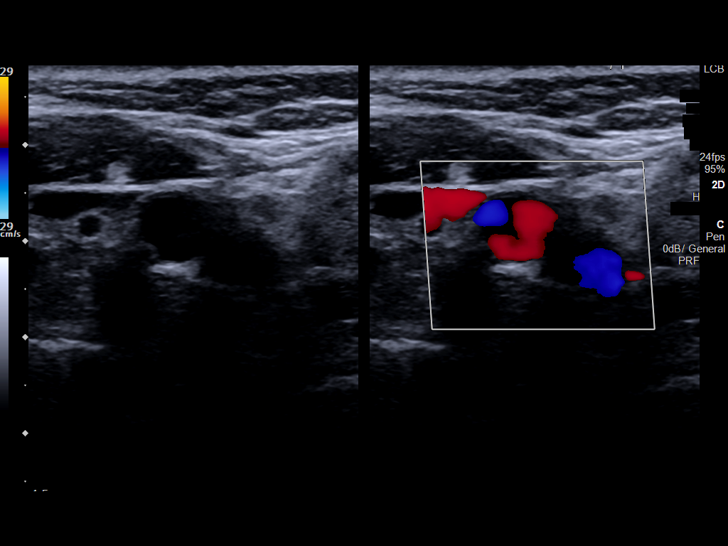
[im 23/66]
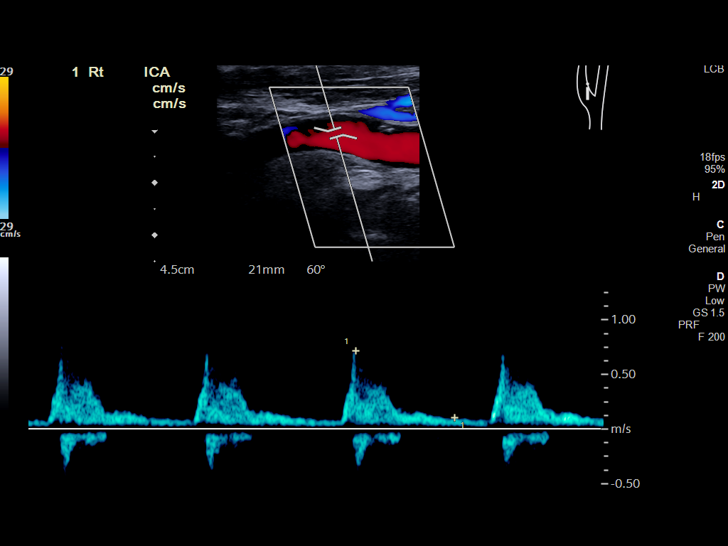
[im 29/66]
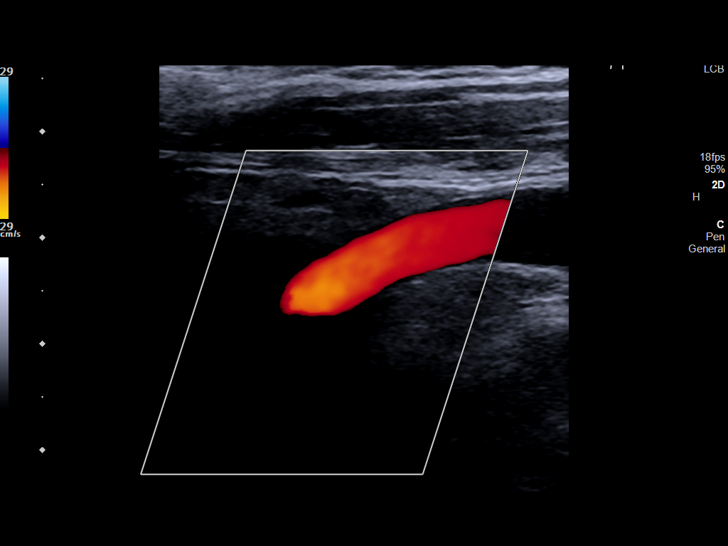
[im 34/66]
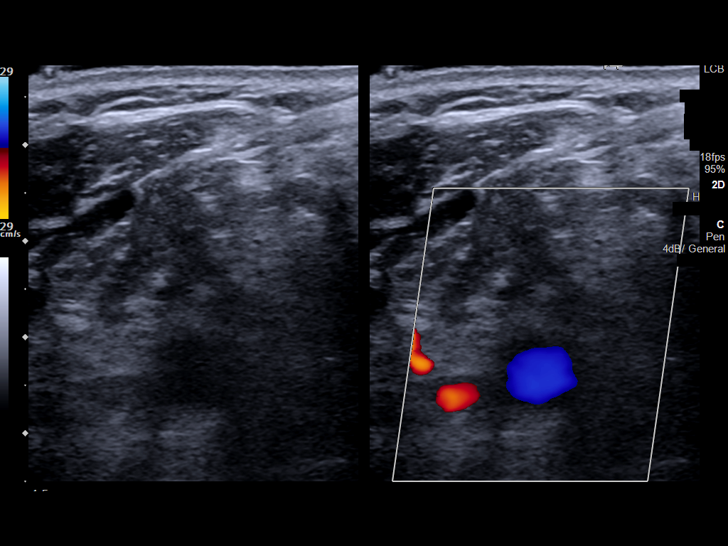
[im 37/66]
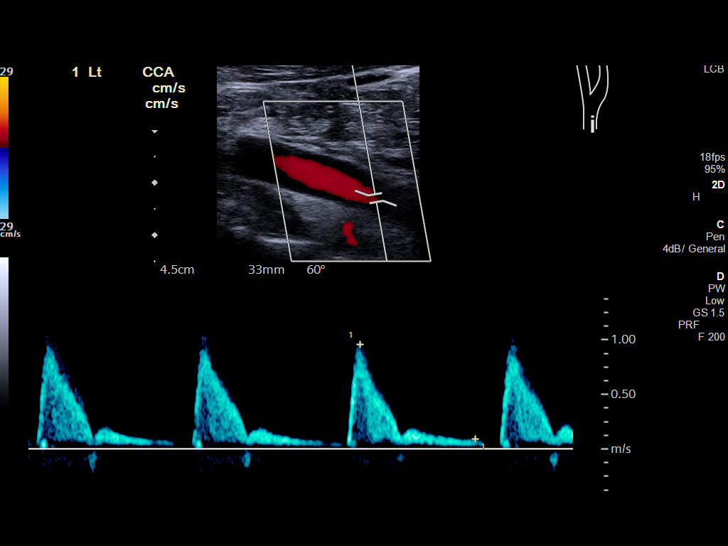
[im 43/66]
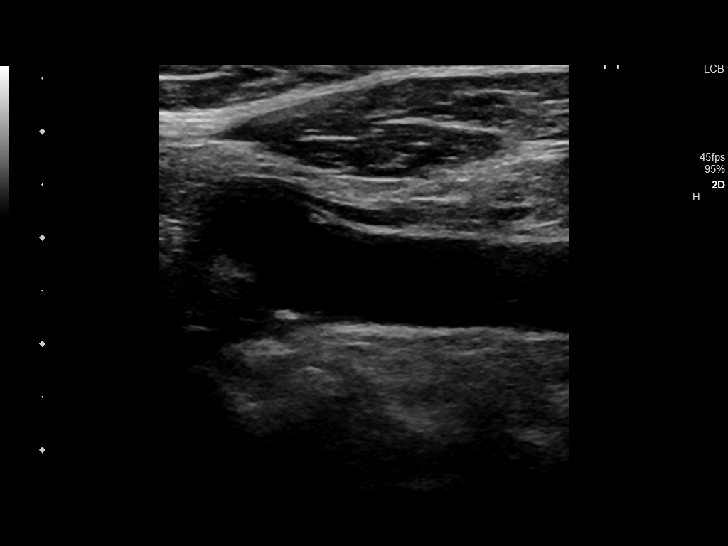
[im 49/66]
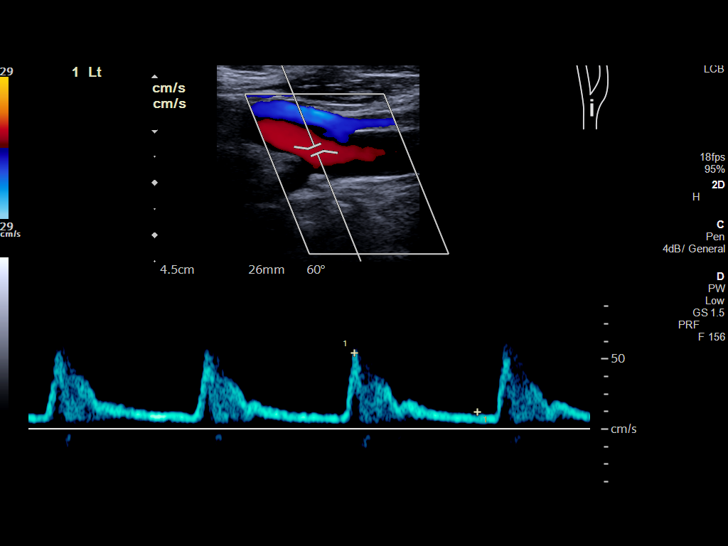
[im 54/66]
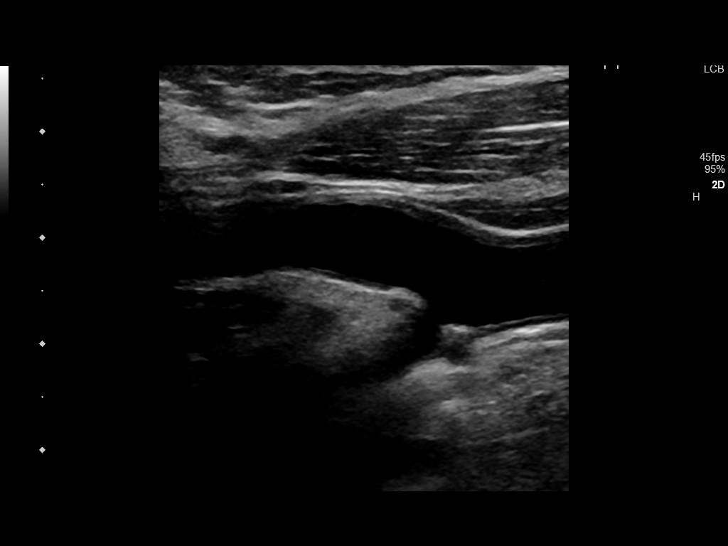
[im 60/66]
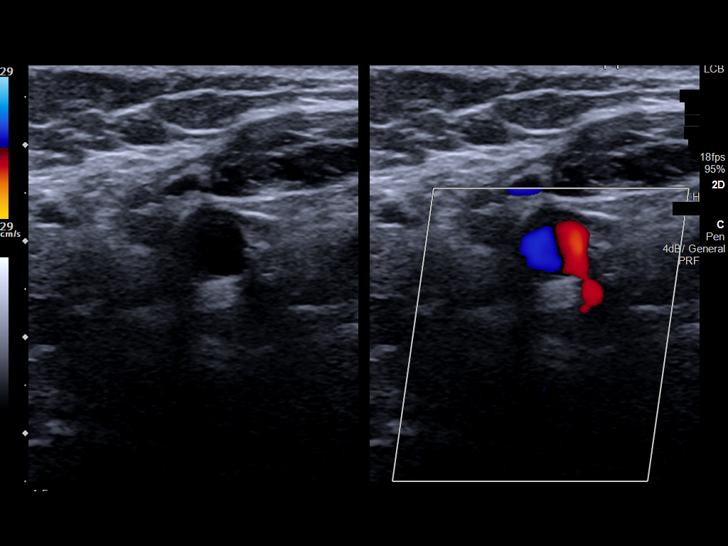
[im 66/66]
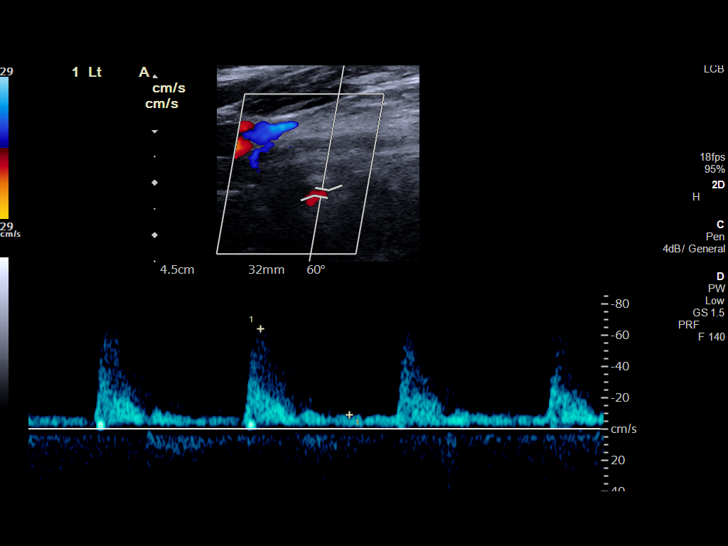

[13 of 24 positions shown; findings below may reference images not displayed]

FINDINGS: Criteria: Quantification of carotid stenosis is based on velocity
parameters that correlate the residual internal carotid diameter
with NASCET-based stenosis levels, using the diameter of the distal
internal carotid lumen as the denominator for stenosis measurement.

The following velocity measurements were obtained:

RIGHT
ICA: 86/14 cm/sec
CCA: 79/8 cm/sec

SYSTOLIC ICA/CCA RATIO:

ECA:  114 cm/sec

LEFT

ICA: 97/14 cm/sec

CCA: 100/10 cm/sec

SYSTOLIC ICA/CCA RATIO:

ECA:  111 cm/sec

RIGHT CAROTID ARTERY: Trace focal atherosclerotic plaque in the
origin of the internal carotid artery. By peak systolic velocity
criteria, the estimated stenosis remains less than 50%.

RIGHT VERTEBRAL ARTERY:  Patent with normal antegrade flow.

LEFT CAROTID ARTERY: Trace heterogeneous atherosclerotic plaque in
the carotid bifurcation. Plaque is relatively smooth. By peak
systolic velocity criteria, the estimated stenosis remains less than
50%.

LEFT VERTEBRAL ARTERY:  Patent with normal antegrade flow.
IMPRESSION: 1. Mild (1-49%) stenosis proximal right internal carotid artery
secondary to trace focal heterogeneous atherosclerotic plaque.
2. Mild (1-49%) stenosis proximal left internal carotid artery
secondary to trace focal heterogeneous but smooth atherosclerotic
plaque.
3. Vertebral arteries are patent with normal antegrade flow.

## 2019-11-22 ENCOUNTER — Other Ambulatory Visit: Payer: Medicare HMO

## 2019-11-24 ENCOUNTER — Telehealth: Payer: Self-pay | Admitting: Oncology

## 2019-11-24 NOTE — Telephone Encounter (Signed)
  I think you all were helping with the monoclonal antibody infusions.  M

## 2019-11-24 NOTE — Telephone Encounter (Signed)
Pt called to report that she is tested positive for COVID. "symptoms are not bad". She asks who and where to call for monoclonal antibody infusion. Advise patient that if she develops shortness of breath, go to ER. Advise her to call PCP's office for further advise of evaluation and antibody infusion.

## 2019-11-25 ENCOUNTER — Telehealth: Payer: Self-pay | Admitting: Oncology

## 2019-11-25 ENCOUNTER — Ambulatory Visit: Payer: Medicare HMO | Admitting: Hematology and Oncology

## 2019-11-25 ENCOUNTER — Encounter: Payer: Self-pay | Admitting: Oncology

## 2019-11-25 NOTE — Telephone Encounter (Signed)
  Thanks  M 

## 2019-11-25 NOTE — Telephone Encounter (Signed)
I connected by phone with   Janet Duffy on 11/25/2019 at noon to discuss the potential use of an new treatment for mild to moderate COVID-19 viral infection in non-hospitalized patients.   This patient is a age/sex that meets the FDA criteria for Emergency Use Authorization of casirivimab\imdevimab.  Has a (+) direct SARS-CoV-2 viral test result 1. Has mild or moderate COVID-19  2. Is ? 86 years of age and weighs ? 40 kg 3. Is NOT hospitalized due to COVID-19 4. Is NOT requiring oxygen therapy or requiring an increase in baseline oxygen flow rate due to COVID-19 5. Is within 10 days of symptom onset 6. Has at least one of the high risk factor(s) for progression to severe COVID-19 and/or hospitalization as defined in EUA. ? Specific high risk criteria : age/ MGUS   Symptom onset  11/19/19   I have spoken and communicated the following to the patient or parent/caregiver:   1. FDA has authorized the emergency use of casirivimab\imdevimab for the treatment of mild to moderate COVID-19 in adults and pediatric patients with positive results of direct SARS-CoV-2 viral testing who are 83 years of age and older weighing at least 40 kg, and who are at high risk for progressing to severe COVID-19 and/or hospitalization.   2. The significant known and potential risks and benefits of casirivimab\imdevimab, and the extent to which such potential risks and benefits are unknown.   3. Information on available alternative treatments and the risks and benefits of those alternatives, including clinical trials.   4. Patients treated with casirivimab\imdevimab should continue to self-isolate and use infection control measures (e.g., wear mask, isolate, social distance, avoid sharing personal items, clean and disinfect "high touch" surfaces, and frequent handwashing) according to CDC guidelines.    5. The patient or parent/caregiver has the option to accept or refuse casirivimab\imdevimab .   Called and spoke with  patient.  Tentatively add her to the [facility] infusion clinic at 8:30 AM tomorrow.  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  Patient is able to drive but would prefer to stay in [place] if possible.  After reviewing this information with the patient, The patient agreed to proceed with receiving casirivimab\imdevimab infusion and will be provided a copy of the Fact sheet prior to receiving the infusion.Rulon Abide, AGNP-C [phone] ([place])

## 2019-11-25 NOTE — Telephone Encounter (Signed)
Yes I will take care of her. Thanks.  Faythe Casa, NP 11/25/2019 10:06 AM

## 2019-11-26 ENCOUNTER — Ambulatory Visit (HOSPITAL_COMMUNITY)
Admission: RE | Admit: 2019-11-26 | Discharge: 2019-11-26 | Disposition: A | Discharge status: Home / Self Care | Payer: Medicare Other | Source: Ambulatory Visit | Attending: Pulmonary Disease | Admitting: Pulmonary Disease

## 2019-11-26 ENCOUNTER — Other Ambulatory Visit (HOSPITAL_COMMUNITY): Payer: Self-pay | Admitting: Oncology

## 2019-11-26 ENCOUNTER — Other Ambulatory Visit (HOSPITAL_COMMUNITY): Payer: Self-pay | Admitting: Physician Assistant

## 2019-11-26 DIAGNOSIS — U071 COVID-19: Secondary | ICD-10-CM

## 2019-11-26 DIAGNOSIS — Z23 Encounter for immunization: Secondary | ICD-10-CM | POA: Diagnosis not present

## 2019-11-26 MED ORDER — SODIUM CHLORIDE 0.9 % IV SOLN
1200.0000 mg | Freq: Once | INTRAVENOUS | Status: AC
  Administered 2019-11-26: 1200 mg via INTRAVENOUS
  Filled 2019-11-26: qty 10

## 2019-11-26 MED ORDER — METHYLPREDNISOLONE SODIUM SUCC 125 MG IJ SOLR: 125.0000 mg | Freq: Once | INTRAMUSCULAR | Status: DC | PRN

## 2019-11-26 MED ORDER — FAMOTIDINE IN NACL 20-0.9 MG/50ML-% IV SOLN: 20.0000 mg | Freq: Once | INTRAVENOUS | Status: DC | PRN

## 2019-11-26 MED ORDER — DIPHENHYDRAMINE HCL 50 MG/ML IJ SOLN: 50.0000 mg | Freq: Once | INTRAMUSCULAR | Status: DC | PRN

## 2019-11-26 MED ORDER — EPINEPHRINE 0.3 MG/0.3ML IJ SOAJ: 0.3000 mg | Freq: Once | INTRAMUSCULAR | Status: DC | PRN

## 2019-11-26 MED ORDER — SODIUM CHLORIDE 0.9 % IV SOLN: INTRAVENOUS | Status: DC | PRN

## 2019-11-26 MED ORDER — ALBUTEROL SULFATE HFA 108 (90 BASE) MCG/ACT IN AERS: 2.0000 | INHALATION_SPRAY | Freq: Once | RESPIRATORY_TRACT | Status: DC | PRN

## 2019-11-26 NOTE — Discharge Instructions (Signed)

## 2019-11-26 NOTE — Progress Notes (Signed)
  Diagnosis: COVID-19  Physician: Dr. Joya Gaskins  Procedure: [facility] Med: casirivimab\imdevimab infusion - Provided patient with casirivimab\imdevimab fact sheet for patients, parents and caregivers prior to infusion.  Complications: No immediate complications noted.  Discharge: Discharged home   [place] 11/26/2019

## 2019-12-05 ENCOUNTER — Inpatient Hospital Stay: Payer: Medicare HMO | Attending: Hematology and Oncology

## 2019-12-05 ENCOUNTER — Other Ambulatory Visit: Payer: Self-pay

## 2019-12-05 DIAGNOSIS — D510 Vitamin B12 deficiency anemia due to intrinsic factor deficiency: Secondary | ICD-10-CM | POA: Diagnosis not present

## 2019-12-05 DIAGNOSIS — I10 Essential (primary) hypertension: Secondary | ICD-10-CM | POA: Insufficient documentation

## 2019-12-05 DIAGNOSIS — C9000 Multiple myeloma not having achieved remission: Secondary | ICD-10-CM | POA: Diagnosis not present

## 2019-12-05 DIAGNOSIS — Z79899 Other long term (current) drug therapy: Secondary | ICD-10-CM | POA: Insufficient documentation

## 2019-12-05 DIAGNOSIS — D72819 Decreased white blood cell count, unspecified: Secondary | ICD-10-CM | POA: Diagnosis not present

## 2019-12-05 DIAGNOSIS — D509 Iron deficiency anemia, unspecified: Secondary | ICD-10-CM | POA: Insufficient documentation

## 2019-12-05 LAB — CBC WITH DIFFERENTIAL/PLATELET
Abs Immature Granulocytes: 0.01 10*3/uL (ref 0.00–0.07)
Basophils Absolute: 0 10*3/uL (ref 0.0–0.1)
Basophils Relative: 1 %
Eosinophils Absolute: 0.2 10*3/uL (ref 0.0–0.5)
Eosinophils Relative: 5 %
HCT: 34.1 % — ABNORMAL LOW (ref 36.0–46.0)
Hemoglobin: 11.8 g/dL — ABNORMAL LOW (ref 12.0–15.0)
Immature Granulocytes: 0 %
Lymphocytes Relative: 24 %
Lymphs Abs: 0.9 10*3/uL (ref 0.7–4.0)
MCH: 32.2 pg (ref 26.0–34.0)
MCHC: 34.6 g/dL (ref 30.0–36.0)
MCV: 93.2 fL (ref 80.0–100.0)
Monocytes Absolute: 0.4 10*3/uL (ref 0.1–1.0)
Monocytes Relative: 11 %
Neutro Abs: 2.2 10*3/uL (ref 1.7–7.7)
Neutrophils Relative %: 59 %
Platelets: 205 10*3/uL (ref 150–400)
RBC: 3.66 MIL/uL — ABNORMAL LOW (ref 3.87–5.11)
RDW: 13.7 % (ref 11.5–15.5)
WBC: 3.6 10*3/uL — ABNORMAL LOW (ref 4.0–10.5)
nRBC: 0 % (ref 0.0–0.2)

## 2019-12-05 LAB — COMPREHENSIVE METABOLIC PANEL
ALT: 13 U/L (ref 0–44)
AST: 17 U/L (ref 15–41)
Albumin: 3.1 g/dL — ABNORMAL LOW (ref 3.5–5.0)
Alkaline Phosphatase: 42 U/L (ref 38–126)
Anion gap: 10 (ref 5–15)
BUN: 10 mg/dL (ref 8–23)
CO2: 27 mmol/L (ref 22–32)
Calcium: 9 mg/dL (ref 8.9–10.3)
Chloride: 98 mmol/L (ref 98–111)
Creatinine, Ser: 0.75 mg/dL (ref 0.44–1.00)
GFR calc Af Amer: >60 mL/min (ref >60)
GFR calc non Af Amer: >60 mL/min (ref >60)
Glucose, Bld: 85 mg/dL (ref 70–99)
Potassium: 4.2 mmol/L (ref 3.5–5.1)
Sodium: 135 mmol/L (ref 135–145)
Total Bilirubin: 0.5 mg/dL (ref 0.3–1.2)
Total Protein: 7.8 g/dL (ref 6.5–8.1)

## 2019-12-06 ENCOUNTER — Other Ambulatory Visit: Payer: Medicare HMO

## 2019-12-06 ENCOUNTER — Ambulatory Visit: Payer: Medicare HMO | Admitting: Hematology and Oncology

## 2019-12-06 LAB — PROTEIN ELECTROPHORESIS, SERUM
A/G Ratio: 0.7 (ref 0.7–1.7)
Albumin ELP: 3.2 g/dL (ref 2.9–4.4)
Alpha-1-Globulin: 0.2 g/dL (ref 0.0–0.4)
Alpha-2-Globulin: 0.6 g/dL (ref 0.4–1.0)
Beta Globulin: 3.4 g/dL — ABNORMAL HIGH (ref 0.7–1.3)
Gamma Globulin: 0.1 g/dL — ABNORMAL LOW (ref 0.4–1.8)
Globulin, Total: 4.3 g/dL — ABNORMAL HIGH (ref 2.2–3.9)
M-Spike, %: 1.4 g/dL — ABNORMAL HIGH
Total Protein ELP: 7.5 g/dL (ref 6.0–8.5)

## 2019-12-10 ENCOUNTER — Ambulatory Visit: Payer: Medicare HMO | Admitting: Hematology and Oncology

## 2019-12-17 ENCOUNTER — Other Ambulatory Visit: Payer: Self-pay

## 2019-12-17 DIAGNOSIS — D472 Monoclonal gammopathy: Secondary | ICD-10-CM

## 2019-12-17 DIAGNOSIS — D509 Iron deficiency anemia, unspecified: Secondary | ICD-10-CM

## 2019-12-17 NOTE — Progress Notes (Signed)
[facility]  [address] [place], [place] [address] Phone: [phone]  Fax: [phone]   Clinic Day:  12/18/2019  Referring physician: Barbaraann Boys, MD  Chief Complaint: Janet [place] is a 86 y.o. female with iron deficiency anemia, leukopenia, and an IgA monoclonal gammopathy who is seen for a 3 month assessment.   HPI: The patient was last seen in the hematology clinic on 09/05/2019. At that time, she was feeling ok. Her energy level was fair.  Exam was stable. Ferritin was 32. TSH was 4.190. Vitamin B12 was 1,336 and folate was 48.0. She continued oral iron and oral B12. Guaiac cards were negative.  Peripheral smear revealed adequate platelets. RBCs showed rouleaux formation, consistent with the patients known IgA lambda monoclonal gammopathy. RBCs were otherwise unremarkable. Leukocytes appeared normal, with adequate neutrophils. There were no immature cells or circulating plasma cells.   The patient tested positive for COVID-19 on 11/22/2019. She has a bad headache, congestion, and fatigue. She received a casirivimab/imdevimab infusion on 11/26/2019, which she states really helped. She felt much stronger a couple of days after the infusion.  Labs on 12/05/2019 revealed a hematocrit of 34.1, hemoglobin 11.8, MCV 93.2, platelets 205,000, WBC 3,600 (ANC 2,200). Albumin was 3.1. M-spike was 1.4.   During the interim, she has been "alright." She is on oral iron once daily and Vitamin B12. Her energy is "fair."  She denies shortness of breath and chest pain. Her joint pain and insomnia are stable. She takes an extra strength Tylenol and half of a Bendadryl to sleep. She is eating well.   Past Medical History:  Diagnosis Date  . Connective tissue disease ([place])   . DDD (degenerative disc disease), lumbar   . Diverticulitis   . Granuloma annulare   . HTN (hypertension)   . Osteoporosis     History reviewed. No pertinent surgical  history.  History reviewed. No pertinent family history.  Social History:  reports that she has never smoked. She has never used smokeless tobacco. She reports that she does not drink alcohol and does not use drugs. She lives in [place]. The patient is alone today.  Allergies:  Allergies  Allergen Reactions  . Clindamycin/Lincomycin Anaphylaxis  . Codeine Nausea Only  . Erythromycin Anaphylaxis  . Ivp Dye [Iodinated Diagnostic Agents] Anaphylaxis  . Metrizamide Anaphylaxis  . Monosodium Glutamate Shortness Of Breath and Swelling    Tongue swelling  . Moxifloxacin Anaphylaxis  . Penicillins Anaphylaxis and Rash    .Has patient had a PCN reaction causing immediate rash, facial/tongue/throat swelling, SOB or lightheadedness with hypotension: Yes Has patient had a PCN reaction causing severe rash involving mucus membranes or skin necrosis: No Has patient had a PCN reaction that required hospitalization: Unknown Has patient had a PCN reaction occurring within the last 10 years: Yes If all of the above answers are "NO", then may proceed with Cephalosporin use.   . Tramadol     Patient discloses she "went crazy"  . Amlodipine Swelling  . Avelox [Moxifloxacin Hcl In Nacl]   . Shellfish Allergy Hives    seafood  . Zoster Vaccine Live Rash    Current Medications: Current Outpatient Medications  Medication Sig Dispense Refill  . acetaminophen (TYLENOL) 325 MG tablet Take 2 tablets (650 mg total) by mouth every 6 (six) hours as needed for mild pain (or Fever >/= 101).    Marland Kitchen aspirin EC 81 MG EC tablet Take 1 tablet (81 mg total) by mouth daily. Jones  tablet 0  . carvedilol (COREG) 25 MG tablet Take 25 mg by mouth 2 (two) times daily.    . Cholecalciferol (VITAMIN D3) 1000 units CAPS Take 1,000 Units by mouth daily.    . ferrous sulfate 325 (65 FE) MG tablet Take 325 mg by mouth daily with breakfast.    . hydrALAZINE (APRESOLINE) 50 MG tablet Take 1 tablet (50 mg total) by mouth 2 (two)  times daily. (Patient taking differently: Take 100 mg by mouth 2 (two) times daily. 50 mg mid day) 30 tablet 0  . lisinopril (PRINIVIL,ZESTRIL) 40 MG tablet Take 1 tablet (40 mg total) by mouth daily. 30 tablet 0  . vitamin B-12 (CYANOCOBALAMIN) 1000 MCG tablet Take 1,000 mcg by mouth daily.    . Wheat Dextrin (BENEFIBER) POWD Take 1 Dose by mouth daily.    . ondansetron (ZOFRAN ODT) 4 MG disintegrating tablet Take 1 tablet (4 mg total) by mouth every 8 (eight) hours as needed for nausea or vomiting. (Patient not taking: Reported on 12/18/2019) 20 tablet 0   No current facility-administered medications for this visit.    Review of Systems  Constitutional: Negative for chills, diaphoresis, fever, malaise/fatigue and weight loss (stable).       Feels "alright." Energy is "fair."  HENT: Negative.  Negative for congestion, ear discharge, ear pain, hearing loss, nosebleeds, sinus pain, sore throat and tinnitus.   Eyes: Negative.  Negative for blurred vision.  Respiratory: Negative.  Negative for cough, hemoptysis, sputum production and shortness of breath.   Cardiovascular: Negative.  Negative for chest pain, palpitations and leg swelling.  Gastrointestinal: Negative.  Negative for abdominal pain, blood in stool, constipation, diarrhea, heartburn, melena, nausea and vomiting.       Eating well.  Genitourinary: Negative.  Negative for dysuria, frequency, hematuria and urgency.  Musculoskeletal: Positive for joint pain (arthritis, hands and knee; pain radiates from left hip to toes). Negative for back pain, myalgias and neck pain.       Sciatica.  Skin: Negative.  Negative for itching and rash.  Neurological: Negative.  Negative for dizziness, tingling, sensory change, weakness and headaches.  Endo/Heme/Allergies: Negative.  Does not bruise/bleed easily.       Raynaud's.  Psychiatric/Behavioral: Negative for depression, memory loss and substance abuse. The patient has insomnia (ongoing). The patient  is not nervous/anxious.   All other systems reviewed and are negative.  Performance status (ECOG): 1  Vitals Blood pressure (!) 166/79, pulse 75, temperature 97.8 F (36.6 C), temperature source Tympanic, resp. rate 18, weight 120 lb 2.4 oz (54.5 kg), SpO2 97 %.   Physical Exam Vitals and nursing note reviewed.  Constitutional:      General: She is not in acute distress.    Appearance: She is well-developed. She is not diaphoretic.  HENT:     Head: Normocephalic and atraumatic.     Comments: Short gray hair.    Mouth/Throat:     Pharynx: No oropharyngeal exudate.  Eyes:     General: No scleral icterus.    Conjunctiva/sclera: Conjunctivae normal.     Pupils: Pupils are equal, round, and reactive to light.     Comments: Blue eyes s/p cataract surgery.  Neck:     Vascular: No JVD.  Cardiovascular:     Rate and Rhythm: Normal rate and regular rhythm.     Heart sounds: Normal heart sounds. No murmur heard.   Pulmonary:     Effort: Pulmonary effort is normal. No respiratory distress.  Breath sounds: Normal breath sounds. No wheezing or rales.  Abdominal:     General: Bowel sounds are normal. There is no distension.     Palpations: Abdomen is soft. There is no hepatomegaly, splenomegaly or mass.     Tenderness: There is no abdominal tenderness. There is no guarding or rebound.  Musculoskeletal:        General: Normal range of motion.     Cervical back: Normal range of motion and neck supple.  Lymphadenopathy:     Head:     Right side of head: No preauricular, posterior auricular or occipital adenopathy.     Left side of head: No preauricular, posterior auricular or occipital adenopathy.     Cervical: No cervical adenopathy.     Upper Body:     Right upper body: No supraclavicular or axillary adenopathy.     Left upper body: No supraclavicular or axillary adenopathy.     Lower Body: No right inguinal adenopathy. No left inguinal adenopathy.  Skin:    General: Skin is warm  and dry.     Coloration: Skin is not pale.     Findings: No erythema or rash.  Neurological:     Mental Status: She is alert and oriented to person, place, and time.  Psychiatric:        Behavior: Behavior normal.        Thought Content: Thought content normal.        Judgment: Judgment normal.     No visits with results within 3 Day(s) from this visit.  Latest known visit with results is:  Appointment on 12/05/2019  Component Date Value Ref Range Status  . Total Protein ELP 12/05/2019 7.5  6.0 - 8.5 g/dL Final  . Albumin ELP 12/05/2019 3.2  2.9 - 4.4 g/dL Final  . Alpha-1-Globulin 12/05/2019 0.2  0.0 - 0.4 g/dL Final  . Alpha-2-Globulin 12/05/2019 0.6  0.4 - 1.0 g/dL Final  . Beta Globulin 12/05/2019 3.4* 0.7 - 1.3 g/dL Final  . Gamma Globulin 12/05/2019 0.1* 0.4 - 1.8 g/dL Final  . M-Spike, % 12/05/2019 1.4* Not Observed g/dL Final  . SPE Interp. 12/05/2019 Comment   Final   Comment: (NOTE) The SPE pattern demonstrates a single peak (M-spike) in the beta region which may represent monoclonal protein. This peak may also be caused by the presence of fibrinogen or circulating immune complexes. If clinically indicated, the presence of a monoclonal gammopathy may be confirmed by serum immunofixation. Performed At: [facility] [place], [place] [id number] Rush Farmer MD GE:[id number]   . Comment 12/05/2019 Comment   Final   Comment: (NOTE) Protein electrophoresis scan will follow via computer, mail, or courier delivery.   . Globulin, Total 12/05/2019 4.3* 2.2 - 3.9 g/dL Corrected  . A/G Ratio 12/05/2019 0.7  0.7 - 1.7 Corrected  . Sodium 12/05/2019 135  135 - 145 mmol/L Final  . Potassium 12/05/2019 4.2  3.5 - 5.1 mmol/L Final  . Chloride 12/05/2019 98  98 - 111 mmol/L Final  . CO2 12/05/2019 27  22 - 32 mmol/L Final  . Glucose, Bld 12/05/2019 85  70 - 99 mg/dL Final   Glucose reference range applies only to samples taken after fasting for at least  8 hours.  . BUN 12/05/2019 10  8 - 23 mg/dL Final  . Creatinine, Ser 12/05/2019 0.75  0.44 - 1.00 mg/dL Final  . Calcium 12/05/2019 9.0  8.9 - 10.3 mg/dL Final  . Total Protein 12/05/2019 7.8  6.5 - 8.1 g/dL Final  . Albumin 12/05/2019 3.1* 3.5 - 5.0 g/dL Final  . AST 12/05/2019 17  15 - 41 U/L Final  . ALT 12/05/2019 13  0 - 44 U/L Final  . Alkaline Phosphatase 12/05/2019 42  38 - 126 U/L Final  . Total Bilirubin 12/05/2019 0.5  0.3 - 1.2 mg/dL Final  . GFR calc non Af Amer 12/05/2019 >60  >60 mL/min Final  . GFR calc Af Amer 12/05/2019 >60  >60 mL/min Final  . Anion gap 12/05/2019 10  5 - 15 Final   Performed at [facility], [address] [place], [place] [address]  . WBC 12/05/2019 3.6* 4.0 - 10.5 K/uL Final  . RBC 12/05/2019 3.66* 3.87 - 5.11 MIL/uL Final  . Hemoglobin 12/05/2019 11.8* 12.0 - 15.0 g/dL Final  . HCT 12/05/2019 34.1* 36 - 46 % Final  . MCV 12/05/2019 93.2  80.0 - 100.0 fL Final  . MCH 12/05/2019 32.2  26.0 - 34.0 pg Final  . MCHC 12/05/2019 34.6  30.0 - 36.0 g/dL Final  . RDW 12/05/2019 13.7  11.5 - 15.5 % Final  . Platelets 12/05/2019 205  150 - 400 K/uL Final  . nRBC 12/05/2019 0.0  0.0 - 0.2 % Final  . Neutrophils Relative % 12/05/2019 59  % Final  . Neutro Abs 12/05/2019 2.2  1.7 - 7.7 K/uL Final  . Lymphocytes Relative 12/05/2019 24  % Final  . Lymphs Abs 12/05/2019 0.9  0.7 - 4.0 K/uL Final  . Monocytes Relative 12/05/2019 11  % Final  . Monocytes Absolute 12/05/2019 0.4  0 - 1 K/uL Final  . Eosinophils Relative 12/05/2019 5  % Final  . Eosinophils Absolute 12/05/2019 0.2  0 - 0 K/uL Final  . Basophils Relative 12/05/2019 1  % Final  . Basophils Absolute 12/05/2019 0.0  0 - 0 K/uL Final  . Immature Granulocytes 12/05/2019 0  % Final  . Abs Immature Granulocytes 12/05/2019 0.01  0.00 - 0.07 K/uL Final   Performed at [facility], [address] [place], [place] [address]    Assessment:  Ellory Khurana is a 86 y.o. female withiron  deficiency anemia, leukopenia, and an IgA monoclonal gammopathy.WBChas ranged from3,900 - 5,600 from 2007-2020.WBC was2,700 on 09/24/2018.  She has anundifferentiated connective tissue disease. She isfollowed by Dr [facility]meta [facility] at [facility].She has a history of digital vascular insufficiency, embolus by arteriogram, positive rheumatoid factor (1:1280), elevated TPA inhibitor, flare of symmetrical polyarthritis 2002, elevated sed rate/CRP, monoclonal IgA lambda,and episodes of morning stiffness,andpolyarthralgiasimproved with steroids  She has a history of colitis/diverticulitsx 3. She does not have ulcerative colitis or Crohn's disease. She required 1 unit of PRBCs4 years ago for GI bleed.  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 Shehas had an elevated IgAsince 2007. IgAhas been followed: 2170 on 01/23/2006, 2340 on 03/02/2006,2470 on 10/14/2015, and 2491 on 10/26/2018. In10/16/2014,she was noted to have a biclonal gammopathy with an IgA lambda and a less intense IgG kappa monoclonal protein.  M spikehas been followed (gm/dL): 1.57 on02/09/2015,1.56on 03/02/2006,1.52 on 03/08/2007, 1.59 on 04/10/2008, 1.91 on 04/16/2009, 1.20on 07/19/2012,1.36 on 01/17/2013, 1.59 on07/08/2014, 1.65 on05/02/2016,2.15 on07/05/2018, 2.4 on 10/26/2018, 2.4 on 02/18/2019, 2.3 on 05/22/2019, 2.5 on 08/26/2019, and 1.4 on 12/05/2019.Kappa free light chains were 1.56, lambda free light chains 1.98,and ratio 0.79 on07/03/2016.  Work-up on 07/24/2020revealed: hematocrit 33.2, hemoglobin 10.9, MCV 92.5, platelets 172,000, WBC 4,300 with an ANC of 1900. Differential was unremarkable. Protein was8.5,  albumin 3.0, and calcium 8.9. Retic countwas0.6%. Iron saturationwas29%with aTIBC 244.B12was79(low) and folatenormal.IgA2491 (high). M-proteinwas2.4gm/dL IgA monoclonal protein with lambda light chain specificity. Kappa light  chain were21.5, lambda light chain 20.8, ratio 1.03(normal).24 hour UPEPrevealed 167 mg/24 hours of protein. There was no monoclonal protein with kappa free light chains 7.70, lambda free light chains <0.65 and ratio >11.85 (1.03-31.76). Flow cytometryshowed 23% of T cells with loss of CD7 (seen in both reactive and neoplastic processes). There was nomonoclonal B cell population.  PET scanon 11/15/2018 revealed no evidence of myeloma or plasmacytoma.  Bone marrow biopsyon 11/19/2018 revealed a hypercellular marrow for age (40%). Myeloid and erythroid elementswerepresent in normal proportions. Megakaryocytes demonstrateda spectrum of maturation without clustering. Therewereno atypical lymphoid aggregates (CD20, CD3) or granulomas. CD138 highlightedan increased plasma cell population(~10%) which were lambda restricted. A subset of the plasma cells also expressedCD56. Differential included anon-IgM monoclonal gammopathy of undetermined significance (MGUS)and plasma cell myeloma.  She has iron deficiency.Ferritinhas been followed: 38 on 04/16/2009, 11 on 05/13/2010, 3 on 10/07/2014, 8 on 09/24/2018, 21 on 02/18/2019, and 31 on 05/22/2019.  Labs on 09/24/2018:hematocrit 35.9, hemoglobin 12.0, MCV 90, platelets 152,000, WBC 2,700(ANC 1,400).Differential included 53% segs, 29% lymphs, 12% monocytes and 5% eosinophils. Creatinine 0.8.Calcium 9.3. Protein 8.8 (high)and albumin 2.9 (low).TSHwas 2.91.HIV, hepatitis B (surface antigen, core antibody total, surface antibody) and hepatitis C were negative on07/03/2016.  Copper was 63 (low) and anti-neutrophil antibodies were negative on 02/21/2019.  She has B12 deficiency. She has pernicious anemia.  Anti-parietal cell antibodies and intrinsic factor antibodies were elevated on 11/29/2018.  B12 was 79 on 10/26/2018.She is on oral B12. B12 was 405 on 11/29/2018.  She has received both of her COVID-19 vaccines.   She tested positive for COVID-19 on 11/22/2019. She received a casirivimab/imdevimab infusion on 11/26/2019.  Symptomatically, her energy level is fair.  She is taking oral iron and B12 daily.  Plan: 1.   Review labs from 12/05/2019. 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    Exam is stable.  She has no CRAB criteria  PET scan on 11/15/2018 revealed no lytic bone lesions. Bone marrow on 11/19/2018 revealed 10% plasma cells which were lambda restricted. M spike reveals a 2.4gm/dLIgA monoclonal protein with lambda light chain specific specificity. 24-hour UPEP revealed no M spike but excess free lambda light chains. M-spike was 1.4 gm/dL on 12/05/2019 (improved).             Criteria for non IgM MGUS: M-spike <3 gm/dL. <10% plasma cells in the marrow. No CRAB criteria. Criteria for asymptomatic smoldering myeloma M spike >3 gm/dL. >= 10% to <60% plasma cells in the marrow. No CRAB criteria Discuss plans for ongoing surveillance every 3 months 3. Iron deficiency anemia Hematocrit34.1. Hemoglobin 11.8. MCV 93.2 on 12/05/2019.   Ferritin32 on 09/05/2019.  She denies any bleeding. Continue oral iron. 4.Leukopenia [id number] with an [place] of2200 on 12/05/2019. Patient has an undifferentiated connective tissue disorder. She is on no new medications or herbal products.  She has had an interval COVID-19 infection.    Copper level was 72 (slightly low).  Anti-neutrophil antibodies were negative.             Continue to monitor with overall improvement in WBC. 5.B12 deficiency B12 was 79  on07/24/2020and 1336 on 09/05/2019. Shebegan oral B12 on07/28/2020.             She has pernicious anemia. Folate was 48 on 09/05/2019.  Monitor B12 and folate annually.  6.   Copper deficiency  Copper was 63 on 02/21/2019 and 72 (low) on 08/26/2019.  She is on a MVI with copper.  Continue to monitor. 7.   RTC in 6 weeks early in [place] for labs (CBC with diff, copper level, IgA, ferritin, iron studies, lipid panel). 8.   RTC in 3 months for MD assessment and labs (CBC with diff, CMP, SPEP).  I discussed the assessment and treatment plan with the patient.  The patient was provided an opportunity to ask questions and all were answered.  The patient agreed with the plan and demonstrated an understanding of the instructions.  The patient was advised to call back if the symptoms worsen or if the condition fails to improve as anticipated.  I provided 18 minutes of face-to-face time during this this encounter and > 50% was spent counseling as documented under my assessment and plan.  An additional 5 minutes were spent reviewing her chart (Epic and Care Everywhere) including notes, labs, and imaging studies.    Lequita Asal, MD, PhD    12/18/2019, 10:16 AM  I, Mirian Mo Tufford, am acting as Education administrator for [employer]. Mike Gip, MD, PhD.  I, Matasha Smigelski C. Mike Gip, MD, have reviewed the above documentation for accuracy and completeness, and I agree with the above.

## 2019-12-18 ENCOUNTER — Encounter: Payer: Self-pay | Admitting: Hematology and Oncology

## 2019-12-18 ENCOUNTER — Other Ambulatory Visit: Payer: Self-pay | Admitting: Hematology and Oncology

## 2019-12-18 ENCOUNTER — Inpatient Hospital Stay: Payer: Medicare HMO | Admitting: Hematology and Oncology

## 2019-12-18 ENCOUNTER — Other Ambulatory Visit: Payer: Self-pay

## 2019-12-18 VITALS — BP 166/79 | HR 75 | Temp 97.8°F | Resp 18 | Wt 120.2 lb

## 2019-12-18 DIAGNOSIS — D472 Monoclonal gammopathy: Secondary | ICD-10-CM

## 2019-12-18 DIAGNOSIS — E538 Deficiency of other specified B group vitamins: Secondary | ICD-10-CM

## 2019-12-18 DIAGNOSIS — D509 Iron deficiency anemia, unspecified: Secondary | ICD-10-CM

## 2019-12-18 DIAGNOSIS — D72819 Decreased white blood cell count, unspecified: Secondary | ICD-10-CM | POA: Diagnosis not present

## 2019-12-18 DIAGNOSIS — E7800 Pure hypercholesterolemia, unspecified: Secondary | ICD-10-CM

## 2019-12-18 DIAGNOSIS — E610 Copper deficiency: Secondary | ICD-10-CM

## 2019-12-18 NOTE — Progress Notes (Signed)
No new changes noted today 

## 2020-01-29 ENCOUNTER — Other Ambulatory Visit: Payer: Self-pay

## 2020-01-29 ENCOUNTER — Inpatient Hospital Stay: Payer: Medicare HMO | Attending: Hematology and Oncology

## 2020-01-29 DIAGNOSIS — K5792 Diverticulitis of intestine, part unspecified, without perforation or abscess without bleeding: Secondary | ICD-10-CM | POA: Diagnosis not present

## 2020-01-29 DIAGNOSIS — D472 Monoclonal gammopathy: Secondary | ICD-10-CM

## 2020-01-29 DIAGNOSIS — E610 Copper deficiency: Secondary | ICD-10-CM

## 2020-01-29 DIAGNOSIS — Z79899 Other long term (current) drug therapy: Secondary | ICD-10-CM | POA: Insufficient documentation

## 2020-01-29 DIAGNOSIS — E7800 Pure hypercholesterolemia, unspecified: Secondary | ICD-10-CM

## 2020-01-29 DIAGNOSIS — D509 Iron deficiency anemia, unspecified: Secondary | ICD-10-CM | POA: Diagnosis not present

## 2020-01-29 LAB — IRON AND TIBC
Iron: 83 ug/dL (ref 28–170)
Saturation Ratios: 37 % — ABNORMAL HIGH (ref 10.4–31.8)
TIBC: 224 ug/dL — ABNORMAL LOW (ref 250–450)
UIBC: 141 ug/dL

## 2020-01-29 LAB — LIPID PANEL
Cholesterol: 135 mg/dL (ref 0–200)
HDL: 49 mg/dL (ref >40)
LDL Cholesterol: 74 mg/dL (ref 0–99)
Total CHOL/HDL Ratio: 2.8 RATIO
Triglycerides: 59 mg/dL (ref <150)
VLDL: 12 mg/dL (ref 0–40)

## 2020-01-29 LAB — CBC WITH DIFFERENTIAL/PLATELET
Abs Immature Granulocytes: 0.02 10*3/uL (ref 0.00–0.07)
Basophils Absolute: 0 10*3/uL (ref 0.0–0.1)
Basophils Relative: 1 %
Eosinophils Absolute: 0.2 10*3/uL (ref 0.0–0.5)
Eosinophils Relative: 5 %
HCT: 34.5 % — ABNORMAL LOW (ref 36.0–46.0)
Hemoglobin: 11.8 g/dL — ABNORMAL LOW (ref 12.0–15.0)
Immature Granulocytes: 1 %
Lymphocytes Relative: 27 %
Lymphs Abs: 0.9 10*3/uL (ref 0.7–4.0)
MCH: 32.5 pg (ref 26.0–34.0)
MCHC: 34.2 g/dL (ref 30.0–36.0)
MCV: 95 fL (ref 80.0–100.0)
Monocytes Absolute: 0.4 10*3/uL (ref 0.1–1.0)
Monocytes Relative: 12 %
Neutro Abs: 1.9 10*3/uL (ref 1.7–7.7)
Neutrophils Relative %: 54 %
Platelets: 180 10*3/uL (ref 150–400)
RBC: 3.63 MIL/uL — ABNORMAL LOW (ref 3.87–5.11)
RDW: 13.9 % (ref 11.5–15.5)
WBC: 3.4 10*3/uL — ABNORMAL LOW (ref 4.0–10.5)
nRBC: 0 % (ref 0.0–0.2)

## 2020-01-29 LAB — FERRITIN: Ferritin: 35 ng/mL (ref 11–307)

## 2020-01-30 LAB — IGA: IgA: 2480 mg/dL — ABNORMAL HIGH (ref 64–422)

## 2020-02-02 LAB — COPPER, SERUM: Copper: 65 ug/dL — ABNORMAL LOW (ref 80–158)

## 2020-02-04 ENCOUNTER — Telehealth: Payer: Self-pay

## 2020-02-04 NOTE — Telephone Encounter (Signed)
Patient aware of low copper level. She is taking 0.5mg  in multivitamin. She states she has gotten a bottle of copper supplement 2mg  but she was going to check with you before she started taking it. Please advise.

## 2020-03-10 NOTE — Progress Notes (Signed)
[facility]  [address], [address] [place], [place] [address] Phone: [phone]  Fax: [phone]   Clinic Day:  03/11/2020  Referring physician: Barbaraann Boys, MD  Chief Complaint: Janet Duffy is a 86 y.o. female with iron deficiency anemia, leukopenia, and an IgA monoclonal gammopathy who is seen for 3 month assessment.   HPI: The patient was last seen in the hematology clinic on 12/18/2019. At that time, her energy level is fair.  She continued oral iron and B12 daily. Hematocrit was 34.1, hemoglobin 11.8, MCV 93.2, platelets 205,000, WBC 3,600 (ANC 2,200). Albumin was 3.1. M spike was 1.4 gm/dL.  The patient established care with Dr. Nehemiah Massed on 01/06/2020 and follow-up on 02/10/2020. She underwent testing (holter monitor, echo, stress test) She has paroxysmal atrial fibrillation and rate control with carvedilol.  Decision was made to not pursue anticoagulation (medication side effects or wishes).  Labs on 01/29/2020 revealed a hematocrit of 34.5, hemoglobin 11.8, MCV 95.0, platelets 180,000, WBC 3,400 (ANC 1,900).  Ferritin was 35 with an iron saturation of 37% and a TIBC of 224. Copper was 65 (80-158).  IgA was 2,480.  During the interim, she has been "making it." Her energy is "partly sunny." She gets shoulder pain and upper back pain if she stands for too long. If she sits down for a few minutes, she feels better. 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 If she lays down on her left hip, she has pain and her toes fall asleep, but this is normal for her. She denies any concerns with infections or diarrhea. Her Raynaud's is stable.  The patient still has trouble sleeping; she takes half of a Benadryl and a Tylenol which help her fall asleep for about 3 hours. She then wakes up and struggles to fall asleep again. She gets up around 6 am.  The patient still takes oral iron and vitamin B12. Her multivitamin contains copper  0.5 mg (56% of RDA). Her copper supplement has 2 mg per serving (1 mg per pill). She is not taking the copper supplement at this time.  The patient is on the fence about taking a blood thinner because her husband had a brain bleed.   She agrees to a bone survey.   Past Medical History:  Diagnosis Date  . Connective tissue disease ([place])   . DDD (degenerative disc disease), lumbar   . Diverticulitis   . Granuloma annulare   . HTN (hypertension)   . Osteoporosis     History reviewed. No pertinent surgical history.  History reviewed. No pertinent family history.  Social History:  reports that she has never smoked. She has never used smokeless tobacco. She reports that she does not drink alcohol and does not use drugs. She lives in [place]. The patient is alone today.  Allergies:  Allergies  Allergen Reactions  . Clindamycin/Lincomycin Anaphylaxis  . Codeine Nausea Only  . Erythromycin Anaphylaxis  . Ivp Dye [Iodinated Diagnostic Agents] Anaphylaxis  . Metrizamide Anaphylaxis  . Monosodium Glutamate Shortness Of Breath and Swelling    Tongue swelling  . Moxifloxacin Anaphylaxis  . Penicillins Anaphylaxis and Rash    .Has patient had a PCN reaction causing immediate rash, facial/tongue/throat swelling, SOB or lightheadedness with hypotension: Yes Has patient had a PCN reaction causing severe rash involving mucus membranes or skin necrosis: No Has patient had a PCN reaction that required hospitalization: Unknown Has patient had a PCN reaction occurring within the last  10 years: Yes If all of the above answers are "NO", then may proceed with Cephalosporin use.   . Tramadol     Patient discloses she "went crazy"  . Amlodipine Swelling  . Avelox [Moxifloxacin Hcl In Nacl]   . Shellfish Allergy Hives    seafood  . Zoster Vaccine Live Rash    Current Medications: Current Outpatient Medications  Medication Sig Dispense Refill  . acetaminophen (TYLENOL) 325 MG tablet Take 2  tablets (650 mg total) by mouth every 6 (six) hours as needed for mild pain (or Fever >/= 101).    Marland Kitchen aspirin EC 81 MG EC tablet Take 1 tablet (81 mg total) by mouth daily. 30 tablet 0  . carvedilol (COREG) 25 MG tablet Take 25 mg by mouth 2 (two) times daily.    . Cholecalciferol (VITAMIN D3) 1000 units CAPS Take 1,000 Units by mouth daily.    . ferrous sulfate 325 (65 FE) MG tablet Take 325 mg by mouth daily with breakfast.    . hydrALAZINE (APRESOLINE) 50 MG tablet Take 1 tablet (50 mg total) by mouth 2 (two) times daily. (Patient taking differently: Take 100 mg by mouth 2 (two) times daily. 50 mg mid day) 30 tablet 0  . lisinopril (PRINIVIL,ZESTRIL) 40 MG tablet Take 1 tablet (40 mg total) by mouth daily. 30 tablet 0  . vitamin B-12 (CYANOCOBALAMIN) 1000 MCG tablet Take 1,000 mcg by mouth daily.    . Wheat Dextrin (BENEFIBER) POWD Take 1 Dose by mouth daily.    . ondansetron (ZOFRAN ODT) 4 MG disintegrating tablet Take 1 tablet (4 mg total) by mouth every 8 (eight) hours as needed for nausea or vomiting. (Patient not taking: Reported on 12/18/2019) 20 tablet 0   No current facility-administered medications for this visit.    Review of Systems  Constitutional: Negative for chills, diaphoresis, fever, malaise/fatigue and weight loss (up 2 lbs).       Feels good. Energy is "partly sunny."  HENT: Negative.  Negative for congestion, ear discharge, ear pain, hearing loss, nosebleeds, sinus pain, sore throat and tinnitus.   Eyes: Negative.  Negative for blurred vision.  Respiratory: Negative.  Negative for cough, hemoptysis, sputum production and shortness of breath.   Cardiovascular: Negative.  Negative for chest pain, palpitations and leg swelling.  Gastrointestinal: Negative.  Negative for abdominal pain, blood in stool, constipation, diarrhea, heartburn, melena, nausea and vomiting.       Eating well.  Genitourinary: Negative.  Negative for dysuria, frequency, hematuria and urgency.   Musculoskeletal: Positive for joint pain (shoulder pain, arthritis in fingers). Negative for back pain, myalgias and neck pain.       Sciatica. Toe tingling and hip pain if she lays on left hip  Skin: Negative.  Negative for itching and rash.  Neurological: Negative.  Negative for dizziness, tingling, sensory change, weakness and headaches.  Endo/Heme/Allergies: Negative.  Does not bruise/bleed easily.       Raynaud's.  Psychiatric/Behavioral: Negative for depression, memory loss and substance abuse. The patient has insomnia (ongoing). The patient is not nervous/anxious.   All other systems reviewed and are negative.  Performance status (ECOG): 1  Vitals Blood pressure (!) 143/77, pulse 62, temperature 98 F (36.7 C), resp. rate 20, weight 122 lb 3.9 oz (55.4 kg), SpO2 100 %.   Physical Exam Vitals and nursing note reviewed.  Constitutional:      General: She is not in acute distress.    Appearance: She is well-developed. She is not diaphoretic.  HENT:     Head: Normocephalic and atraumatic.     Comments: Short gray hair.    Mouth/Throat:     Pharynx: No oropharyngeal exudate.  Eyes:     General: No scleral icterus.    Conjunctiva/sclera: Conjunctivae normal.     Pupils: Pupils are equal, round, and reactive to light.     Comments: Blue eyes s/p cataract surgery.  Neck:     Vascular: No JVD.  Cardiovascular:     Rate and Rhythm: Normal rate and regular rhythm.     Heart sounds: Normal heart sounds. No murmur heard.   Pulmonary:     Effort: Pulmonary effort is normal. No respiratory distress.     Breath sounds: Normal breath sounds. No wheezing or rales.  Abdominal:     General: Bowel sounds are normal. There is no distension.     Palpations: Abdomen is soft. There is no hepatomegaly, splenomegaly or mass.     Tenderness: There is no abdominal tenderness. There is no guarding or rebound.  Musculoskeletal:        General: Normal range of motion.     Cervical back: Normal  range of motion and neck supple.  Lymphadenopathy:     Head:     Right side of head: No preauricular, posterior auricular or occipital adenopathy.     Left side of head: No preauricular, posterior auricular or occipital adenopathy.     Cervical: No cervical adenopathy.     Upper Body:     Right upper body: No supraclavicular or axillary adenopathy.     Left upper body: No supraclavicular or axillary adenopathy.     Lower Body: No right inguinal adenopathy. No left inguinal adenopathy.  Skin:    General: Skin is warm and dry.     Coloration: Skin is not pale.     Findings: No erythema or rash.  Neurological:     Mental Status: She is alert and oriented to person, place, and time.  Psychiatric:        Behavior: Behavior normal.        Thought Content: Thought content normal.        Judgment: Judgment normal.     Appointment on 03/11/2020  Component Date Value Ref Range Status  . WBC 03/11/2020 2.9* 4.0 - 10.5 K/uL Final  . RBC 03/11/2020 3.68* 3.87 - 5.11 MIL/uL Final  . Hemoglobin 03/11/2020 11.7* 12.0 - 15.0 g/dL Final  . HCT 03/11/2020 35.5* 36 - 46 % Final  . MCV 03/11/2020 96.5  80.0 - 100.0 fL Final  . MCH 03/11/2020 31.8  26.0 - 34.0 pg Final  . MCHC 03/11/2020 33.0  30.0 - 36.0 g/dL Final  . RDW 03/11/2020 14.5  11.5 - 15.5 % Final  . Platelets 03/11/2020 170  150 - 400 K/uL Final  . nRBC 03/11/2020 0.0  0.0 - 0.2 % Final  . Neutrophils Relative % 03/11/2020 48  % Final  . Neutro Abs 03/11/2020 1.4* 1.7 - 7.7 K/uL Final  . Lymphocytes Relative 03/11/2020 34  % Final  . Lymphs Abs 03/11/2020 1.0  0.7 - 4.0 K/uL Final  . Monocytes Relative 03/11/2020 12  % Final  . Monocytes Absolute 03/11/2020 0.4  0.1 - 1.0 K/uL Final  . Eosinophils Relative 03/11/2020 5  % Final  . Eosinophils Absolute 03/11/2020 0.2  0.0 - 0.5 K/uL Final  . Basophils Relative 03/11/2020 1  % Final  . Basophils Absolute 03/11/2020 0.0  0.0 - 0.1 K/uL Final  .  Immature Granulocytes 03/11/2020 0  %  Final  . Abs Immature Granulocytes 03/11/2020 0.01  0.00 - 0.07 K/uL Final   Performed at [facility], [address], [place], [place] [address]  . Sodium 03/11/2020 135  135 - 145 mmol/L Final  . Potassium 03/11/2020 3.4* 3.5 - 5.1 mmol/L Final  . Chloride 03/11/2020 96* 98 - 111 mmol/L Final  . CO2 03/11/2020 31  22 - 32 mmol/L Final  . Glucose, Bld 03/11/2020 119* 70 - 99 mg/dL Final   Glucose reference range applies only to samples taken after fasting for at least 8 hours.  . BUN 03/11/2020 15  8 - 23 mg/dL Final  . Creatinine, Ser 03/11/2020 0.86  0.44 - 1.00 mg/dL Final  . Calcium 41/58/3094 9.2  8.9 - 10.3 mg/dL Final  . Total Protein 03/11/2020 8.1  6.5 - 8.1 g/dL Final  . Albumin 07/68/0881 3.2* 3.5 - 5.0 g/dL Final  . AST 02/02/5944 18  15 - 41 U/L Final  . ALT 03/11/2020 13  0 - 44 U/L Final  . Alkaline Phosphatase 03/11/2020 43  38 - 126 U/L Final  . Total Bilirubin 03/11/2020 0.6  0.3 - 1.2 mg/dL Final  . GFR, Estimated 03/11/2020 >60  >60 mL/min Final   Comment: (NOTE) Calculated using the CKD-EPI Creatinine Equation (2021)   . Anion gap 03/11/2020 8  5 - 15 Final   Performed at [facility], [address], [place], [place] [address]    Assessment:  Kellen Hover is a 86 y.o. female withiron deficiency anemia, leukopenia, and an IgA monoclonal gammopathy.WBChas ranged from3,900 - 5,600 from 2007-2020.WBC was2,700 on 09/24/2018.  She has anundifferentiated connective tissue disease. She isfollowed by Dr Willette Pa at [facility].She has a history of digital vascular insufficiency, embolus by arteriogram, positive rheumatoid factor (1:1280), elevated TPA inhibitor, flare of symmetrical polyarthritis 2002, elevated sed rate/CRP, monoclonal IgA lambda,and episodes of morning stiffness,andpolyarthralgiasimproved with steroids  She has a history of colitis/diverticulitsx 3. She does not have ulcerative colitis or Crohn's  disease. She required 1 unit of PRBCs4 years ago for GI bleed.  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 Shehas had an elevated IgAsince 2007. IgAhas been followed: 2170 on 01/23/2006, 2340 on 03/02/2006,2470 on 10/14/2015, 2491 on 10/26/2018, and 2480 on 01/29/2020. In10/16/2014,she was noted to have a biclonal gammopathy with an IgA lambda and a less intense IgG kappa monoclonal protein.  M spikehas been followed (gm/dL): 2.44 QK86/38/1771,1.65BX 03/02/2006,1.52 on 03/08/2007, 1.59 on 04/10/2008, 1.91 on 04/16/2009, 1.20on 07/19/2012,1.36 on 01/17/2013, 1.59 on07/08/2014, 1.65 on05/02/2016,2.15 on07/05/2018, 2.4 on 10/26/2018, 2.4 on 02/18/2019, 2.3 on 05/22/2019, 2.5 on 08/26/2019, 1.4 on 12/05/2019, and 2.4 on 03/11/2020.Kappa free light chains were 1.56, lambda free light chains 1.98,and ratio 0.79 on07/03/2016.  Work-up on 07/24/2020revealed: hematocrit 33.2, hemoglobin 10.9, MCV 92.5, platelets 172,000, WBC 4,300 with an ANC of 1900. Differential was unremarkable. Protein was8.5, albumin 3.0, and calcium 8.9. Retic countwas0.6%. Iron saturationwas29%with aTIBC 244.B12was79(low) and folatenormal.IgA2491 (high). M-proteinwas2.4gm/dL IgA monoclonal protein with lambda light chain specificity. Kappa light chain were21.5, lambda light chain 20.8, ratio 1.03(normal).24 hour UPEPrevealed 167 mg/24 hours of protein. There was no monoclonal protein with kappa free light chains 7.70, lambda free light chains <0.65 and ratio >11.85 (1.03-31.76). Flow cytometryshowed 23% of T cells with loss of CD7 (seen in both reactive and neoplastic processes). There was nomonoclonal B cell population.  PET scanon 11/15/2018 revealed no evidence of myeloma or plasmacytoma.  Bone marrow  biopsyon 11/19/2018 revealed a hypercellular marrow for age (40%). Myeloid and erythroid elementswerepresent in normal proportions.  Megakaryocytes demonstrateda spectrum of maturation without clustering. Therewereno atypical lymphoid aggregates (CD20, CD3) or granulomas. CD138 highlightedan increased plasma cell population(~10%) which were lambda restricted. A subset of the plasma cells also expressedCD56. Differential included anon-IgM monoclonal gammopathy of undetermined significance (MGUS)and plasma cell myeloma.  She has iron deficiency.Ferritinhas been followed: 38 on 04/16/2009, 11 on 05/13/2010, 3 on 10/07/2014, 8 on 09/24/2018, 21 on 02/18/2019, and 31 on 05/22/2019.  Labs on 09/24/2018:hematocrit 35.9, hemoglobin 12.0, MCV 90, platelets 152,000, WBC 2,700(ANC 1,400).Differential included 53% segs, 29% lymphs, 12% monocytes and 5% eosinophils. Creatinine 0.8.Calcium 9.3. Protein 8.8 (high)and albumin 2.9 (low).TSHwas 2.91.HIV, hepatitis B (surface antigen, core antibody total, surface antibody) and hepatitis C were negative on07/03/2016.  Copper was 63 (low) and anti-neutrophil antibodies were negative on 02/21/2019.  She has B12 deficiency. She has pernicious anemia.  Anti-parietal cell antibodies and intrinsic factor antibodies were elevated on 11/29/2018.  B12 was 79 on 10/26/2018.She is on oral B12. B12 was 405 on 11/29/2018.  She has received both of her COVID-19 vaccines.  She tested positive for COVID-19 on 11/22/2019. She received a casirivimab/imdevimab infusion on 11/26/2019. She is planning to get her Booster shot. She received the flu vaccine.  Symptomatically, she has been "making it." Her energy is "partly sunny." She gets shoulder pain and upper back pain if she stands for too long. If she sits down for a few minutes, she feels better. If she lays down on her left hip, she has pain and her toes feel numb (no change). She denies any concerns with infections or diarrhea. Her Raynaud's is stable.  She takes oral iron and vitamin B12. Her multivitamin contains copper 0.5 mg  (56% of RDA). Her copper supplement has 2 mg per serving (1 mg per pill); she is not taking.  Exam is stable.  Plan: 1.   Labs today: CBC with diff, CMP, SPEP. 2.IgA monoclonal gammopathy with lambda light chain specificity Clinically, she appears to be doing well  Exam is stable.   She has no CRAB criteria.   Calcium is 9.2.  Creatinine is 0.86.  Hemoglobin is 11.7.  No known bone lesions.  PET scan on 11/15/2018 revealed no lytic bone lesions. Bone marrow on 11/19/2018 revealed 10% plasma cells which were lambda restricted. M spike is an IgA monoclonal protein with lambda light chain specific specificity. 24-hour UPEP revealed no M spike but excess free lambda light chains. M-spike is 2.4 today.             Criteria for non IgM MGUS: M-spike <3 gm/dL. <10% plasma cells in the marrow. No CRAB criteria. Criteria for asymptomatic smoldering myeloma M spike >3 gm/dL. >= 10% to <60% plasma cells in the marrow. No CRAB criteria.  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  Bone survey.  Continue to monitor every 3 months. 3. Iron deficiency anemia Hematocrit34.1. Hemoglobin 11.8. MCV 93.2 on 12/05/2019.   Ferritin32 on 09/05/2019.  She denies any bleeding. Continue oral iron. 4.Leukopenia [id number] with an Abernathy of1400 today. Patient has an undifferentiated connective tissue disorder. She is on no new medications or herbal products.  She has had an interval COVID-19 infection.    Copper level was 65 (slightly low) on 01/29/2020.  Anti-neutrophil antibodies were negative.  Review neutropenic precautions.  Continue to monitor. 5.B12 deficiency B12 was 1336 on  09/05/2019. She is on oral B12.  She has pernicious anemia. Folate was 48 on 09/05/2019.  Monitor B12 and folate annually. 6.   Copper deficiency  Copper was 63 on 02/21/2019 and 65 (low) on 01/29/2020.  She is on a MVI with copper.  Begin copper 1 mg a day. 7.   Bone survey. 8.   RTC in 1 month for labs (CBC, copper level). 9.   RTC in 3 months for MD assessment, labs (CBC with diff, CMP, SPEP, FLCA), and review of bone survey.  I discussed the assessment and treatment plan with the patient.  The patient was provided an opportunity to ask questions and all were answered.  The patient agreed with the plan and demonstrated an understanding of the instructions.  The patient was advised to call back if the symptoms worsen or if the condition fails to improve as anticipated.  I provided 24 minutes of face-to-face time during this this encounter and > 50% was spent counseling as documented under my assessment and plan.  An additional 6-7 minutes were spent reviewing her chart (Epic and Care Everywhere) including notes, labs, and imaging studies.    Lequita Asal, MD, PhD    03/11/2020, 10:38 AM  I, Mirian Mo Tufford, am acting as Education administrator for [employer]. Mike Gip, MD, PhD.  I, Sumiya Mamaril C. Mike Gip, MD, have reviewed the above documentation for accuracy and completeness, and I agree with the above.

## 2020-03-11 ENCOUNTER — Inpatient Hospital Stay: Payer: Medicare HMO

## 2020-03-11 ENCOUNTER — Ambulatory Visit
Admission: RE | Admit: 2020-03-11 | Discharge: 2020-03-11 | Disposition: A | Discharge status: Home / Self Care | Payer: Medicare HMO | Source: Ambulatory Visit | Attending: Hematology and Oncology | Admitting: Hematology and Oncology

## 2020-03-11 ENCOUNTER — Other Ambulatory Visit: Payer: Self-pay

## 2020-03-11 ENCOUNTER — Ambulatory Visit
Admission: RE | Admit: 2020-03-11 | Discharge: 2020-03-11 | Disposition: A | Discharge status: Home / Self Care | Payer: Medicare HMO | Attending: Hematology and Oncology | Admitting: Hematology and Oncology

## 2020-03-11 ENCOUNTER — Encounter: Payer: Self-pay | Admitting: Hematology and Oncology

## 2020-03-11 ENCOUNTER — Inpatient Hospital Stay: Payer: Medicare HMO | Attending: Hematology and Oncology | Admitting: Hematology and Oncology

## 2020-03-11 VITALS — BP 143/77 | HR 62 | Temp 98.0°F | Resp 20 | Wt 122.2 lb

## 2020-03-11 DIAGNOSIS — I10 Essential (primary) hypertension: Secondary | ICD-10-CM | POA: Diagnosis not present

## 2020-03-11 DIAGNOSIS — E610 Copper deficiency: Secondary | ICD-10-CM

## 2020-03-11 DIAGNOSIS — Z79899 Other long term (current) drug therapy: Secondary | ICD-10-CM | POA: Diagnosis not present

## 2020-03-11 DIAGNOSIS — D472 Monoclonal gammopathy: Secondary | ICD-10-CM | POA: Insufficient documentation

## 2020-03-11 DIAGNOSIS — D509 Iron deficiency anemia, unspecified: Secondary | ICD-10-CM | POA: Diagnosis not present

## 2020-03-11 DIAGNOSIS — I7300 Raynaud's syndrome without gangrene: Secondary | ICD-10-CM | POA: Diagnosis not present

## 2020-03-11 DIAGNOSIS — E538 Deficiency of other specified B group vitamins: Secondary | ICD-10-CM

## 2020-03-11 DIAGNOSIS — D72819 Decreased white blood cell count, unspecified: Secondary | ICD-10-CM

## 2020-03-11 DIAGNOSIS — D510 Vitamin B12 deficiency anemia due to intrinsic factor deficiency: Secondary | ICD-10-CM | POA: Diagnosis not present

## 2020-03-11 LAB — COMPREHENSIVE METABOLIC PANEL
ALT: 13 U/L (ref 0–44)
AST: 18 U/L (ref 15–41)
Albumin: 3.2 g/dL — ABNORMAL LOW (ref 3.5–5.0)
Alkaline Phosphatase: 43 U/L (ref 38–126)
Anion gap: 8 (ref 5–15)
BUN: 15 mg/dL (ref 8–23)
CO2: 31 mmol/L (ref 22–32)
Calcium: 9.2 mg/dL (ref 8.9–10.3)
Chloride: 96 mmol/L — ABNORMAL LOW (ref 98–111)
Creatinine, Ser: 0.86 mg/dL (ref 0.44–1.00)
GFR, Estimated: >60 mL/min (ref >60)
Glucose, Bld: 119 mg/dL — ABNORMAL HIGH (ref 70–99)
Potassium: 3.4 mmol/L — ABNORMAL LOW (ref 3.5–5.1)
Sodium: 135 mmol/L (ref 135–145)
Total Bilirubin: 0.6 mg/dL (ref 0.3–1.2)
Total Protein: 8.1 g/dL (ref 6.5–8.1)

## 2020-03-11 LAB — CBC WITH DIFFERENTIAL/PLATELET
Abs Immature Granulocytes: 0.01 10*3/uL (ref 0.00–0.07)
Basophils Absolute: 0 10*3/uL (ref 0.0–0.1)
Basophils Relative: 1 %
Eosinophils Absolute: 0.2 10*3/uL (ref 0.0–0.5)
Eosinophils Relative: 5 %
HCT: 35.5 % — ABNORMAL LOW (ref 36.0–46.0)
Hemoglobin: 11.7 g/dL — ABNORMAL LOW (ref 12.0–15.0)
Immature Granulocytes: 0 %
Lymphocytes Relative: 34 %
Lymphs Abs: 1 10*3/uL (ref 0.7–4.0)
MCH: 31.8 pg (ref 26.0–34.0)
MCHC: 33 g/dL (ref 30.0–36.0)
MCV: 96.5 fL (ref 80.0–100.0)
Monocytes Absolute: 0.4 10*3/uL (ref 0.1–1.0)
Monocytes Relative: 12 %
Neutro Abs: 1.4 10*3/uL — ABNORMAL LOW (ref 1.7–7.7)
Neutrophils Relative %: 48 %
Platelets: 170 10*3/uL (ref 150–400)
RBC: 3.68 MIL/uL — ABNORMAL LOW (ref 3.87–5.11)
RDW: 14.5 % (ref 11.5–15.5)
WBC: 2.9 10*3/uL — ABNORMAL LOW (ref 4.0–10.5)
nRBC: 0 % (ref 0.0–0.2)

## 2020-03-13 LAB — PROTEIN ELECTROPHORESIS, SERUM
A/G Ratio: 0.8 (ref 0.7–1.7)
Albumin ELP: 3.4 g/dL (ref 2.9–4.4)
Alpha-1-Globulin: 0.2 g/dL (ref 0.0–0.4)
Alpha-2-Globulin: 0.6 g/dL (ref 0.4–1.0)
Beta Globulin: 3.5 g/dL — ABNORMAL HIGH (ref 0.7–1.3)
Gamma Globulin: 0.1 g/dL — ABNORMAL LOW (ref 0.4–1.8)
Globulin, Total: 4.4 g/dL — ABNORMAL HIGH (ref 2.2–3.9)
M-Spike, %: 2.4 g/dL — ABNORMAL HIGH
Total Protein ELP: 7.8 g/dL (ref 6.0–8.5)

## 2020-03-23 ENCOUNTER — Other Ambulatory Visit: Payer: Self-pay

## 2020-03-23 ENCOUNTER — Emergency Department: Payer: Medicare HMO

## 2020-03-23 ENCOUNTER — Observation Stay: Payer: Medicare HMO

## 2020-03-23 ENCOUNTER — Observation Stay
Admission: EM | Admit: 2020-03-23 | Discharge: 2020-03-24 | Disposition: A | Discharge status: Home / Self Care | Payer: Medicare HMO | Attending: Internal Medicine | Admitting: Internal Medicine

## 2020-03-23 ENCOUNTER — Encounter: Payer: Self-pay | Admitting: Emergency Medicine

## 2020-03-23 DIAGNOSIS — N289 Disorder of kidney and ureter, unspecified: Secondary | ICD-10-CM | POA: Diagnosis not present

## 2020-03-23 DIAGNOSIS — R55 Syncope and collapse: Secondary | ICD-10-CM | POA: Insufficient documentation

## 2020-03-23 DIAGNOSIS — D472 Monoclonal gammopathy: Secondary | ICD-10-CM | POA: Diagnosis not present

## 2020-03-23 DIAGNOSIS — D509 Iron deficiency anemia, unspecified: Secondary | ICD-10-CM | POA: Diagnosis not present

## 2020-03-23 DIAGNOSIS — E876 Hypokalemia: Secondary | ICD-10-CM | POA: Insufficient documentation

## 2020-03-23 DIAGNOSIS — Z7982 Long term (current) use of aspirin: Secondary | ICD-10-CM | POA: Insufficient documentation

## 2020-03-23 DIAGNOSIS — E860 Dehydration: Secondary | ICD-10-CM

## 2020-03-23 DIAGNOSIS — Z880 Allergy status to penicillin: Secondary | ICD-10-CM

## 2020-03-23 DIAGNOSIS — K5792 Diverticulitis of intestine, part unspecified, without perforation or abscess without bleeding: Principal | ICD-10-CM

## 2020-03-23 DIAGNOSIS — E871 Hypo-osmolality and hyponatremia: Secondary | ICD-10-CM | POA: Diagnosis not present

## 2020-03-23 DIAGNOSIS — Z79899 Other long term (current) drug therapy: Secondary | ICD-10-CM | POA: Diagnosis not present

## 2020-03-23 DIAGNOSIS — Z20822 Contact with and (suspected) exposure to covid-19: Secondary | ICD-10-CM | POA: Insufficient documentation

## 2020-03-23 DIAGNOSIS — K5732 Diverticulitis of large intestine without perforation or abscess without bleeding: Secondary | ICD-10-CM

## 2020-03-23 DIAGNOSIS — I10 Essential (primary) hypertension: Secondary | ICD-10-CM | POA: Diagnosis not present

## 2020-03-23 DIAGNOSIS — L719 Rosacea, unspecified: Secondary | ICD-10-CM | POA: Diagnosis not present

## 2020-03-23 DIAGNOSIS — D510 Vitamin B12 deficiency anemia due to intrinsic factor deficiency: Secondary | ICD-10-CM | POA: Diagnosis present

## 2020-03-23 DIAGNOSIS — N2889 Other specified disorders of kidney and ureter: Secondary | ICD-10-CM | POA: Diagnosis present

## 2020-03-23 DIAGNOSIS — R1032 Left lower quadrant pain: Secondary | ICD-10-CM | POA: Diagnosis present

## 2020-03-23 LAB — BASIC METABOLIC PANEL
Anion gap: 12 (ref 5–15)
Anion gap: 12 (ref 5–15)
BUN: 13 mg/dL (ref 8–23)
BUN: 11 mg/dL (ref 8–23)
CO2: 25 mmol/L (ref 22–32)
CO2: 28 mmol/L (ref 22–32)
Calcium: 9.5 mg/dL (ref 8.9–10.3)
Calcium: 9.1 mg/dL (ref 8.9–10.3)
Chloride: 92 mmol/L — ABNORMAL LOW (ref 98–111)
Chloride: 90 mmol/L — ABNORMAL LOW (ref 98–111)
Creatinine, Ser: 0.8 mg/dL (ref 0.44–1.00)
Creatinine, Ser: 0.76 mg/dL (ref 0.44–1.00)
GFR, Estimated: >60 mL/min (ref >60)
GFR, Estimated: >60 mL/min (ref >60)
Glucose, Bld: 138 mg/dL — ABNORMAL HIGH (ref 70–99)
Glucose, Bld: 112 mg/dL — ABNORMAL HIGH (ref 70–99)
Potassium: 3.4 mmol/L — ABNORMAL LOW (ref 3.5–5.1)
Potassium: 3 mmol/L — ABNORMAL LOW (ref 3.5–5.1)
Sodium: 129 mmol/L — ABNORMAL LOW (ref 135–145)
Sodium: 130 mmol/L — ABNORMAL LOW (ref 135–145)

## 2020-03-23 LAB — SODIUM, URINE, RANDOM: Sodium, Ur: 67 mmol/L

## 2020-03-23 LAB — HEPATIC FUNCTION PANEL
ALT: 15 U/L (ref 0–44)
AST: 19 U/L (ref 15–41)
Albumin: 3.2 g/dL — ABNORMAL LOW (ref 3.5–5.0)
Alkaline Phosphatase: 41 U/L (ref 38–126)
Bilirubin, Direct: <0.1 mg/dL (ref 0.0–0.2)
Total Bilirubin: 0.5 mg/dL (ref 0.3–1.2)
Total Protein: 8.3 g/dL — ABNORMAL HIGH (ref 6.5–8.1)

## 2020-03-23 LAB — TSH: TSH: 2.938 u[IU]/mL (ref 0.350–4.500)

## 2020-03-23 LAB — BRAIN NATRIURETIC PEPTIDE: B Natriuretic Peptide: 105.1 pg/mL — ABNORMAL HIGH (ref 0.0–100.0)

## 2020-03-23 LAB — RESP PANEL BY RT-PCR (FLU A&B, COVID) ARPGX2
Influenza A by PCR: NEGATIVE
Influenza B by PCR: NEGATIVE
SARS Coronavirus 2 by RT PCR: NEGATIVE

## 2020-03-23 LAB — URINALYSIS, COMPLETE (UACMP) WITH MICROSCOPIC
Bacteria, UA: NONE SEEN
Bilirubin Urine: NEGATIVE
Glucose, UA: NEGATIVE mg/dL
Hgb urine dipstick: NEGATIVE
Ketones, ur: NEGATIVE mg/dL
Leukocytes,Ua: NEGATIVE
Nitrite: NEGATIVE
Protein, ur: NEGATIVE mg/dL
Specific Gravity, Urine: 1.011 (ref 1.005–1.030)
pH: 7 (ref 5.0–8.0)

## 2020-03-23 LAB — CBC
HCT: 36.2 % (ref 36.0–46.0)
Hemoglobin: 12.2 g/dL (ref 12.0–15.0)
MCH: 31.9 pg (ref 26.0–34.0)
MCHC: 33.7 g/dL (ref 30.0–36.0)
MCV: 94.5 fL (ref 80.0–100.0)
Platelets: 197 10*3/uL (ref 150–400)
RBC: 3.83 MIL/uL — ABNORMAL LOW (ref 3.87–5.11)
RDW: 13.8 % (ref 11.5–15.5)
WBC: 6.9 10*3/uL (ref 4.0–10.5)
nRBC: 0 % (ref 0.0–0.2)

## 2020-03-23 LAB — LIPASE, BLOOD: Lipase: 41 U/L (ref 11–51)

## 2020-03-23 LAB — OSMOLALITY: Osmolality: 283 mOsm/kg (ref 275–295)

## 2020-03-23 LAB — OSMOLALITY, URINE: Osmolality, Ur: 382 mOsm/kg (ref 300–900)

## 2020-03-23 LAB — MAGNESIUM: Magnesium: 1.8 mg/dL (ref 1.7–2.4)

## 2020-03-23 MED ORDER — METRONIDAZOLE 500 MG PO TABS
500.0000 mg | ORAL_TABLET | Freq: Three times a day (TID) | ORAL | Status: DC
  Administered 2020-03-23 – 2020-03-24 (×3): 500 mg via ORAL
  Filled 2020-03-23 (×7): qty 1

## 2020-03-23 MED ORDER — ASPIRIN EC 81 MG PO TBEC
81.0000 mg | DELAYED_RELEASE_TABLET | Freq: Every day | ORAL | Status: DC
  Administered 2020-03-24: 08:00:00 81 mg via ORAL
  Filled 2020-03-23: qty 1

## 2020-03-23 MED ORDER — MORPHINE SULFATE (PF) 2 MG/ML IV SOLN
2.0000 mg | Freq: Once | INTRAVENOUS | Status: AC
  Administered 2020-03-23: 12:00:00 2 mg via INTRAVENOUS
  Filled 2020-03-23 (×2): qty 1

## 2020-03-23 MED ORDER — VITAMIN B-12 1000 MCG PO TABS
1000.0000 ug | ORAL_TABLET | Freq: Every day | ORAL | Status: DC
  Administered 2020-03-24: 08:00:00 1000 ug via ORAL
  Filled 2020-03-23 (×2): qty 1

## 2020-03-23 MED ORDER — ONDANSETRON HCL 4 MG/2ML IJ SOLN: 4.0000 mg | Freq: Three times a day (TID) | INTRAMUSCULAR | Status: DC | PRN

## 2020-03-23 MED ORDER — VITAMIN D 25 MCG (1000 UNIT) PO TABS
1000.0000 [IU] | ORAL_TABLET | Freq: Every day | ORAL | Status: DC
  Administered 2020-03-23 – 2020-03-24 (×2): 1000 [IU] via ORAL
  Filled 2020-03-23 (×2): qty 1

## 2020-03-23 MED ORDER — SODIUM CHLORIDE 0.9 % IV SOLN: INTRAVENOUS | Status: DC

## 2020-03-23 MED ORDER — MORPHINE SULFATE (PF) 2 MG/ML IV SOLN
2.0000 mg | Freq: Once | INTRAVENOUS | Status: AC
  Administered 2020-03-23: 10:00:00 2 mg via INTRAVENOUS
  Filled 2020-03-23: qty 1

## 2020-03-23 MED ORDER — ONDANSETRON HCL 4 MG/2ML IJ SOLN
4.0000 mg | Freq: Once | INTRAMUSCULAR | Status: AC
  Administered 2020-03-23: 10:00:00 4 mg via INTRAVENOUS
  Filled 2020-03-23: qty 2

## 2020-03-23 MED ORDER — POTASSIUM CHLORIDE CRYS ER 20 MEQ PO TBCR
40.0000 meq | EXTENDED_RELEASE_TABLET | Freq: Once | ORAL | Status: AC
  Administered 2020-03-23: 12:00:00 40 meq via ORAL
  Filled 2020-03-23: qty 2

## 2020-03-23 MED ORDER — LISINOPRIL 20 MG PO TABS
40.0000 mg | ORAL_TABLET | Freq: Every day | ORAL | Status: DC
  Administered 2020-03-23 – 2020-03-24 (×2): 40 mg via ORAL
  Filled 2020-03-23: qty 2
  Filled 2020-03-23: qty 4

## 2020-03-23 MED ORDER — SODIUM CHLORIDE 0.9 % IV SOLN: 2.0000 g | Freq: Three times a day (TID) | INTRAVENOUS | Status: DC

## 2020-03-23 MED ORDER — PSYLLIUM 95 % PO PACK
1.0000 | PACK | Freq: Every day | ORAL | Status: DC
  Filled 2020-03-23: qty 1

## 2020-03-23 MED ORDER — HYDRALAZINE HCL 20 MG/ML IJ SOLN: 5.0000 mg | INTRAMUSCULAR | Status: DC | PRN

## 2020-03-23 MED ORDER — CARVEDILOL 25 MG PO TABS
25.0000 mg | ORAL_TABLET | Freq: Two times a day (BID) | ORAL | Status: DC
  Administered 2020-03-23 – 2020-03-24 (×3): 25 mg via ORAL
  Filled 2020-03-23 (×2): qty 1
  Filled 2020-03-23: qty 4

## 2020-03-23 MED ORDER — HYDRALAZINE HCL 50 MG PO TABS
50.0000 mg | ORAL_TABLET | Freq: Two times a day (BID) | ORAL | Status: DC
  Administered 2020-03-23 – 2020-03-24 (×3): 50 mg via ORAL
  Filled 2020-03-23 (×3): qty 1

## 2020-03-23 MED ORDER — SULFAMETHOXAZOLE-TRIMETHOPRIM 400-80 MG/5ML IV SOLN
160.0000 mg | Freq: Two times a day (BID) | INTRAVENOUS | Status: DC
  Filled 2020-03-23 (×2): qty 10

## 2020-03-23 MED ORDER — SODIUM CHLORIDE 0.9 % IV SOLN
2.0000 g | INTRAVENOUS | Status: DC
  Administered 2020-03-23: 15:00:00 2 g via INTRAVENOUS
  Filled 2020-03-23 (×2): qty 20

## 2020-03-23 MED ORDER — ACETAMINOPHEN 325 MG PO TABS
650.0000 mg | ORAL_TABLET | Freq: Four times a day (QID) | ORAL | Status: DC | PRN
  Administered 2020-03-23 – 2020-03-24 (×2): 650 mg via ORAL
  Filled 2020-03-23 (×2): qty 2

## 2020-03-23 MED ORDER — LACTATED RINGERS IV BOLUS
1000.0000 mL | Freq: Once | INTRAVENOUS | Status: AC
  Administered 2020-03-23: 10:00:00 1000 mL via INTRAVENOUS

## 2020-03-23 MED ORDER — FERROUS SULFATE 325 (65 FE) MG PO TABS
325.0000 mg | ORAL_TABLET | Freq: Every day | ORAL | Status: DC
  Administered 2020-03-24: 08:00:00 325 mg via ORAL
  Filled 2020-03-23 (×2): qty 1

## 2020-03-23 MED ORDER — MORPHINE SULFATE (PF) 2 MG/ML IV SOLN
1.0000 mg | INTRAVENOUS | Status: DC | PRN
  Administered 2020-03-23: 1 mg via INTRAVENOUS
  Filled 2020-03-23: qty 1

## 2020-03-23 NOTE — Consult Note (Signed)
NAMETaiwan Duffy  DOB: 1931-09-29  MRN: [id number]  Date/Time: 03/23/2020 11:56 AM  REQUESTING PROVIDER: Dr.Niu Subjective:  REASON FOR CONSULT: Diverticulitis with  allergies to antibiotics ?History obtained from patient- chart reviewed Janet Duffy is a 86 y.o. female with a history of IgA monoclonal gammopathy, leucopenia, IDA , HTN, diverticulitis presents with left lower abdominal pain, nausea of 2 days duration. She passed out this morning when she went to the bathroom but did not hurt herself. Says she is constipated for the past 2 days No fever, no dysuria Vitals in the ED Temp 99.3, BP 172/69, HR 64 WBC 6.9 ( baseline 2.9-3.6) , HB 12.2, Cr 0.80 Past Medical History:  Diagnosis Date  . Connective tissue disease ([place])   . DDD (degenerative disc disease), lumbar   . Diverticulitis   . Granuloma annulare   . HTN (hypertension)   . Osteoporosis     History reviewed. No pertinent surgical history.  Social History   Socioeconomic History  . Marital status: Married    Spouse name: Not on file  . Number of children: Not on file  . Years of education: Not on file  . Highest education level: Not on file  Occupational History  . Not on file  Tobacco Use  . Smoking status: Never Smoker  . Smokeless tobacco: Never Used  Vaping Use  . Vaping Use: Never used  Substance and Sexual Activity  . Alcohol use: No  . Drug use: No  . Sexual activity: Not on file  Other Topics Concern  . Not on file  Social History Narrative  . Not on file   Social Determinants of Health   Financial Resource Strain: Not on file  Food Insecurity: Not on file  Transportation Needs: Not on file  Physical Activity: Not on file  Stress: Not on file  Social Connections: Not on file  Intimate Partner Violence: Not on file    No family history on file. Allergies  Allergen Reactions  . Clindamycin/Lincomycin Anaphylaxis  . Codeine Nausea Only  . Erythromycin Anaphylaxis  . Ivp Dye [Iodinated  Diagnostic Agents] Anaphylaxis  . Metrizamide Anaphylaxis  . Monosodium Glutamate Shortness Of Breath and Swelling    Tongue swelling  . Moxifloxacin Anaphylaxis  . Penicillins Anaphylaxis and Rash    .Has patient had a PCN reaction causing immediate rash, facial/tongue/throat swelling, SOB or lightheadedness with hypotension: Yes Has patient had a PCN reaction causing severe rash involving mucus membranes or skin necrosis: No Has patient had a PCN reaction that required hospitalization: Unknown Has patient had a PCN reaction occurring within the last 10 years: Yes If all of the above answers are "NO", then may proceed with Cephalosporin use.   . Tramadol     Patient discloses she "went crazy"  . Amlodipine Swelling  . Avelox [Moxifloxacin Hcl In Nacl]   . Shellfish Allergy Hives    seafood  . Zoster Vaccine Live Rash   ? Current Facility-Administered Medications  Medication Dose Route Frequency Provider Last Rate Last Admin  . 0.9 %  sodium chloride infusion   Intravenous Continuous Ivor Costa, MD      . acetaminophen (TYLENOL) tablet 650 mg  650 mg Oral Q6H PRN Ivor Costa, MD      . cefTRIAXone (ROCEPHIN) 2 g in sodium chloride 0.9 % 100 mL IVPB  2 g Intravenous Q24H Sharron Petruska, MD      . hydrALAZINE (APRESOLINE) injection 5 mg  5 mg Intravenous Q2H PRN Ivor Costa, MD      .  metroNIDAZOLE (FLAGYL) tablet 500 mg  500 mg Oral Q8H Carrie Mew, MD   500 mg at 03/23/20 1041  . morphine 2 MG/ML injection 2 mg  2 mg Intravenous Once Carrie Mew, MD      . ondansetron [facility]) injection 4 mg  4 mg Intravenous Q8H PRN Ivor Costa, MD      . potassium chloride SA (KLOR-CON) CR tablet 40 mEq  40 mEq Oral Once Ivor Costa, MD       Current Outpatient Medications  Medication Sig Dispense Refill  . acetaminophen (TYLENOL) 325 MG tablet Take 2 tablets (650 mg total) by mouth every 6 (six) hours as needed for mild pain (or Fever >/= 101).    Marland Kitchen aspirin EC 81 MG EC tablet  Take 1 tablet (81 mg total) by mouth daily. 30 tablet 0  . carvedilol (COREG) 25 MG tablet Take 25 mg by mouth 2 (two) times daily.    . chlorthalidone (HYGROTON) 25 MG tablet Take 25 mg by mouth daily as needed.    . Cholecalciferol (VITAMIN D3) 1000 units CAPS Take 1,000 Units by mouth daily.    . ferrous sulfate 325 (65 FE) MG tablet Take 325 mg by mouth daily with breakfast.    . hydrALAZINE (APRESOLINE) 50 MG tablet Take 1 tablet (50 mg total) by mouth 2 (two) times daily. (Patient taking differently: Take 50 mg by mouth See admin instructions. Take 1 tablet (50mg ) by mouth twice daily - may take 1 tablet (50mg ) at lunchtime if needed) 30 tablet 0  . lisinopril (PRINIVIL,ZESTRIL) 40 MG tablet Take 1 tablet (40 mg total) by mouth daily. 30 tablet 0  . vitamin B-12 (CYANOCOBALAMIN) 1000 MCG tablet Take 1,000 mcg by mouth daily.    . Wheat Dextrin (BENEFIBER) POWD Take 1 Dose by mouth daily.       Abtx:  Anti-infectives (From admission, onward)   Start     Dose/Rate Route Frequency Ordered Stop   03/23/20 1400  ceFEPIme (MAXIPIME) 2 g in sodium chloride 0.9 % 100 mL IVPB  Status:  Discontinued        2 g 200 mL/hr over 30 Minutes Intravenous Every 8 hours 03/23/20 1032 03/23/20 1105   03/23/20 1200  cefTRIAXone (ROCEPHIN) 2 g in sodium chloride 0.9 % 100 mL IVPB        2 g 200 mL/hr over 30 Minutes Intravenous Every 24 hours 03/23/20 1156     03/23/20 1030  sulfamethoxazole-trimethoprim (BACTRIM) 160 mg in dextrose 5 % 250 mL IVPB  Status:  Discontinued        160 mg 260 mL/hr over 60 Minutes Intravenous Every 12 hours 03/23/20 1012 03/23/20 1032   03/23/20 1015  metroNIDAZOLE (FLAGYL) tablet 500 mg        500 mg Oral Every 8 hours 03/23/20 1012        REVIEW OF SYSTEMS:  Const: negative fever, negative chills, negative weight loss Eyes: negative diplopia or visual changes, negative eye pain ENT: negative coryza, negative sore throat Resp: negative cough, hemoptysis, dyspnea Cards:  negative for chest pain, palpitations, lower extremity edema GU: negative for frequency, dysuria and hematuria GI: as above Skin:has rosacea and eczema and sun damaged skin Heme: negative for easy bruising and gum/nose bleeding MS: negative for myalgias, arthralgias, back pain and muscle weakness Neurolo:negative for headaches, dizziness, vertigo, memory problems  Psych: negative for feelings of anxiety, depression  Endocrine: negative for thyroid, diabetes Allergy/Immunology- negative for any medication or food allergies ?  Objective:  VITALS:  BP (!) 186/75   Pulse 64   Temp 98.5 F (36.9 C) (Oral)   Resp 18   Ht 5\' 3"  (1.6 m)   Wt 54.4 kg   SpO2 96%   BMI 21.26 kg/m  PHYSICAL EXAM:  General: Alert, cooperative, no distress, appears stated age.  Head: Normocephalic, without obvious abnormality, atraumatic. Eyes: Conjunctivae clear, anicteric sclerae. Pupils are equal ENT Nares normal. No drainage or sinus tenderness. Lips, mucosa, and tongue normal. No Thrush Neck: Supple, symmetrical, no adenopathy, thyroid: non tender no carotid bruit and no JVD. Back: No CVA tenderness. Lungs: Clear to auscultation bilaterally. No Wheezing or Rhonchi. No rales. Heart: Regular rate and rhythm, no murmur, rub or gallop. Abdomen: Soft, non-tender,not distended. Bowel sounds normal. No masses Extremities: atraumatic, no cyanosis. No edema. No clubbing Skin: erythematous rash over forehead Lymph: Cervical, supraclavicular normal. Neurologic: Grossly non-focal Pertinent Labs Lab Results CBC    Component Value Date/Time   WBC 6.9 03/23/2020 0638   RBC 3.83 (L) 03/23/2020 0638   HGB 12.2 03/23/2020 0638   HCT 36.2 03/23/2020 0638   PLT 197 03/23/2020 0638   MCV 94.5 03/23/2020 0638   MCH 31.9 03/23/2020 0638   MCHC 33.7 03/23/2020 0638   RDW 13.8 03/23/2020 0638   LYMPHSABS 1.0 03/11/2020 0907   MONOABS 0.4 03/11/2020 0907   EOSABS 0.2 03/11/2020 0907   BASOSABS 0.0 03/11/2020  0907    CMP Latest Ref Rng & Units 03/23/2020 03/11/2020 12/05/2019  Glucose 70 - 99 mg/dL 138(H) 119(H) 85  BUN 8 - 23 mg/dL 13 15 10   Creatinine 0.44 - 1.00 mg/dL 0.80 0.86 0.75  Sodium 135 - 145 mmol/L 129(L) 135 135  Potassium 3.5 - 5.1 mmol/L 3.4(L) 3.4(L) 4.2  Chloride 98 - 111 mmol/L 92(L) 96(L) 98  CO2 22 - 32 mmol/L 25 31 27   Calcium 8.9 - 10.3 mg/dL 9.5 9.2 9.0  Total Protein 6.5 - 8.1 g/dL 8.3(H) 8.1 7.8  Total Bilirubin 0.3 - 1.2 mg/dL 0.5 0.6 0.5  Alkaline Phos 38 - 126 U/L 41 43 42  AST 15 - 41 U/L 19 18 17   ALT 0 - 44 U/L 15 13 13       Microbiology: Recent Results (from the past 240 hour(s))  Resp Panel by RT-PCR (Flu A&B, Covid) Nasopharyngeal Swab     Status: None   Collection Time: 03/23/20 10:44 AM   Specimen: Nasopharyngeal Swab; Nasopharyngeal(NP) swabs in vial transport medium  Result Value Ref Range Status   SARS Coronavirus 2 by RT PCR NEGATIVE NEGATIVE Final    Comment: (NOTE) SARS-CoV-2 target nucleic acids are NOT DETECTED.  The SARS-CoV-2 RNA is generally detectable in upper respiratory specimens during the acute phase of infection. The lowest concentration of SARS-CoV-2 viral copies this assay can detect is 138 copies/mL. A negative result does not preclude SARS-Cov-2 infection and should not be used as the sole basis for treatment or other patient management decisions. A negative result may occur with  improper specimen collection/handling, submission of specimen other than nasopharyngeal swab, presence of viral mutation(s) within the areas targeted by this assay, and inadequate number of viral copies(<138 copies/mL). A negative result must be combined with clinical observations, patient history, and epidemiological information. The expected result is Negative.  Fact Sheet for Patients:  EntrepreneurPulse.com.au  Fact Sheet for Healthcare Providers:  IncredibleEmployment.be  This test is no t yet approved  or cleared by the [place] FDA and  has been authorized for detection  and/or diagnosis of SARS-CoV-2 by FDA under an Emergency Use Authorization (EUA). This EUA will remain  in effect (meaning this test can be used) for the duration of the COVID-19 declaration under Section 564(b)(1) of the Act, 21 U.S.C.section 360bbb-3(b)(1), unless the authorization is terminated  or revoked sooner.       Influenza A by PCR NEGATIVE NEGATIVE Final   Influenza B by PCR NEGATIVE NEGATIVE Final    Comment: (NOTE) The Xpert Xpress SARS-CoV-2/FLU/RSV plus assay is intended as an aid in the diagnosis of influenza from Nasopharyngeal swab specimens and should not be used as a sole basis for treatment. Nasal washings and aspirates are unacceptable for Xpert Xpress SARS-CoV-2/FLU/RSV testing.  Fact Sheet for Patients: EntrepreneurPulse.com.au  Fact Sheet for Healthcare Providers: IncredibleEmployment.be  This test is not yet approved or cleared by the [place] FDA and has been authorized for detection and/or diagnosis of SARS-CoV-2 by FDA under an Emergency Use Authorization (EUA). This EUA will remain in effect (meaning this test can be used) for the duration of the COVID-19 declaration under Section 564(b)(1) of the Act, 21 U.S.C. section 360bbb-3(b)(1), unless the authorization is terminated or revoked.  Performed at [facility], [address] [place], [place] [address]     IMAGING RESULTS: Scattered slightly prominent stool throughout colon.Appendix not visualized, but no pericecal inflammatory process seen.Diverticulosis of descending and sigmoid colon with wall thickening and pericolic inflammatory changes at distal descending colon consistent with acute diverticulitis. No evidence of extraluminal gas or abscess to suggest perforationI have personally reviewed the films ? Impression/Recommendation ? ?Acute diverticulitis-of  descending /sigmoid  No perf  pt stable  . Will give ceftriaxone + flagyl  PCN allergy 35 yrs ago- says she had swelling , sob Has taken keflex at the dentist with no issues Takes benadryl everyday 1/2 tablet for sleep- no prophylactic benadryl needed  IgA monoclonal gammopathy- followed by Heme/onc- observation  H/o positive rheumatoid factor ? Eczema /rosacea face ___________________________________________________ Discussed with patient, requesting provider Note:  This document was prepared using Dragon voice recognition software and may include unintentional dictation errors.

## 2020-03-23 NOTE — ED Triage Notes (Signed)
Patient with complaint of lower abdominal pain that started Saturday. Patient states that she has a history of diverticulitis and the pain feels the same. Patient states that she got up to go to the bathroom this morning and had a syncopal episode. Patient denies any pain from the fall.

## 2020-03-23 NOTE — H&P (Addendum)
History and Physical    Janet Duffy OBS:[id number] DOB: January 25, 1932 DOA: 03/23/2020  Referring MD/NP/PA:   PCP: Barbaraann Boys, MD   Patient coming from:  The patient is coming from home.  At baseline, pt is independent for most of ADL.        Chief Complaint: abdominal pain  HPI: Janet Duffy is a 86 y.o. female with medical history significant of hypertension, diverticulitis, IgA monoclonal, uptake, anemia, hard of hearing, hypertension, who presents with abdominal pain.  Patient states that she has been having abdominal pain for almost 3 days, which is located in lower abdomen, constant, moderate, sharp, nonradiating. Patient has nausea, but no vomiting, diarrhea. No fever or chills. Patient does not have chest pain, cough, shortness breath. No symptoms of UTI.  Patient states that she got up to go to the bathroom this morning and had syncopal episode. Patient states that she felt very nauseated, then passed out and fell. She denies significant injury. Denies headache or neck pain. He does not have unilateral numbness or tingling in extremities, facial droop or slurred speech.    ED Course: pt was found to have WBC 6.9, pending COVID-19 PCR, lipase 41, sodium 129, potassium 3.4, renal function okay, temperature normal, blood pressure 186/75, heart rate 64, RR 18, oxygen saturation 96% on room air.  CT abdomen/pelvis that showed distal colon diverticulitis without evidence of perforation or abscess formation. Patient is placed on MedSurg bed for observation.  CT-abd/pelvis: Acute diverticulitis of the distal descending colon without evidence of perforation or abscess.  Small hiatal hernia and probable small posterior diverticulum of the upper stomach.  Tiny nonobstructing RIGHT renal calculus.  10 x 6 mm exophytic nodule at upper to mid LEFT kidney posteriorly, indeterminate in character and slightly increased in size since 2018; follow-up non emergent characterization by  MR imaging with and without contrast recommended.  Chronic L2 compression fracture and prior vertebroplasty.  Aortic Atherosclerosis (ICD10-I70.0).   Review of Systems:   General: no fevers, chills, no body weight gain, has poor appetite, has fatigue HEENT: no blurry vision, or sore throat Respiratory: no dyspnea, coughing, wheezing CV: no chest pain, no palpitations GI: has nausea, no vomiting, has abdominal pain, no diarrhea, constipation GU: no dysuria, burning on urination, increased urinary frequency, hematuria  Ext: no leg edema Neuro: no unilateral weakness, numbness, or tingling, no vision change or hearing loss. Had syncope Skin: no rash, no skin tear. MSK: No muscle spasm, no deformity, no limitation of range of movement in spin Heme: No easy bruising.  Travel history: No recent long distant travel.  Allergy:  Allergies  Allergen Reactions  . Clindamycin/Lincomycin Anaphylaxis  . Codeine Nausea Only  . Erythromycin Anaphylaxis  . Ivp Dye [Iodinated Diagnostic Agents] Anaphylaxis  . Metrizamide Anaphylaxis  . Monosodium Glutamate Shortness Of Breath and Swelling    Tongue swelling  . Moxifloxacin Anaphylaxis  . Penicillins Anaphylaxis and Rash    .Has patient had a PCN reaction causing immediate rash, facial/tongue/throat swelling, SOB or lightheadedness with hypotension: Yes Has patient had a PCN reaction causing severe rash involving mucus membranes or skin necrosis: No Has patient had a PCN reaction that required hospitalization: Unknown Has patient had a PCN reaction occurring within the last 10 years: Yes If all of the above answers are "NO", then may proceed with Cephalosporin use.   . Tramadol     Patient discloses she "went crazy"  . Amlodipine Swelling  . Avelox [Moxifloxacin Hcl In Nacl]   .  Shellfish Allergy Hives    seafood  . Zoster Vaccine Live Rash    Past Medical History:  Diagnosis Date  . Connective tissue disease ([place])   . DDD  (degenerative disc disease), lumbar   . Diverticulitis   . Granuloma annulare   . HTN (hypertension)   . Osteoporosis     Past Surgical History:  Procedure Laterality Date  . ABDOMINAL HYSTERECTOMY    . BACK SURGERY      Social History:  reports that she has never smoked. She has never used smokeless tobacco. She reports that she does not drink alcohol and does not use drugs.  Family History:  Family History  Problem Relation Age of Onset  . Ovarian cancer Mother   . Breast cancer Sister   . Leukemia Brother   . Colon cancer Brother      Prior to Admission medications   Medication Sig Start Date End Date Taking? Authorizing Provider  acetaminophen (TYLENOL) 325 MG tablet Take 2 tablets (650 mg total) by mouth every 6 (six) hours as needed for mild pain (or Fever >/= 101). 09/21/16   Dustin Flock, MD  aspirin EC 81 MG EC tablet Take 1 tablet (81 mg total) by mouth daily. 02/13/18   Epifanio Lesches, MD  carvedilol (COREG) 25 MG tablet Take 25 mg by mouth 2 (two) times daily. 05/18/16   [provider]  Cholecalciferol (VITAMIN D3) 1000 units CAPS Take 1,000 Units by mouth daily.    [provider]  ferrous sulfate 325 (65 FE) MG tablet Take 325 mg by mouth daily with breakfast.    [provider]  hydrALAZINE (APRESOLINE) 50 MG tablet Take 1 tablet (50 mg total) by mouth 2 (two) times daily. Patient taking differently: Take 100 mg by mouth 2 (two) times daily. 50 mg mid day 02/12/18   Epifanio Lesches, MD  lisinopril (PRINIVIL,ZESTRIL) 40 MG tablet Take 1 tablet (40 mg total) by mouth daily. 02/13/18   Epifanio Lesches, MD  ondansetron (ZOFRAN ODT) 4 MG disintegrating tablet Take 1 tablet (4 mg total) by mouth every 8 (eight) hours as needed for nausea or vomiting. Patient not taking: Reported on 12/18/2019 07/21/16   Rudene Re, MD  vitamin B-12 (CYANOCOBALAMIN) 1000 MCG tablet Take 1,000 mcg by mouth daily.    [provider]   Wheat Dextrin (BENEFIBER) POWD Take 1 Dose by mouth daily.    [provider]    Physical Exam: Vitals:   03/23/20 0956 03/23/20 1300 03/23/20 1400 03/23/20 1733  BP: (!) 186/75 (!) 178/72 (!) 172/69 (!) 192/84  Pulse: 64  64 61  Resp: 18  13 19   Temp:  99.3 F (37.4 C)  98.5 F (36.9 C)  TempSrc:    Oral  SpO2: 96%  94% 98%  Weight:      Height:       General: Not in acute distress HEENT:       Eyes: PERRL, EOMI, no scleral icterus.       ENT: No discharge from the ears and nose, no pharynx injection, no tonsillar enlargement.        Neck: No JVD, no bruit, no mass felt. Heme: No neck lymph node enlargement. Cardiac: S1/S2, RRR, No murmurs, No gallops or rubs. Respiratory: No rales, wheezing, rhonchi or rubs. GI: Soft, nondistended, has tenderness in lower abdomen, no rebound pain, no organomegaly, BS present. GU: No hematuria Ext: No pitting leg edema bilaterally. 2+DP/PT pulse bilaterally. Musculoskeletal: No joint deformities, No joint  redness or warmth, no limitation of ROM in spin. Skin: No rashes.  Neuro: Alert, oriented X3, cranial nerves II-XII grossly intact, moves all extremities normally.  Psych: Patient is not psychotic, no suicidal or hemocidal ideation.  Labs on Admission: I have personally reviewed following labs and imaging studies  CBC: Recent Labs  Lab 03/23/20 0638  WBC 6.9  HGB 12.2  HCT 36.2  MCV 94.5  PLT 572   Basic Metabolic Panel: Recent Labs  Lab 03/23/20 0638 03/23/20 1157  NA 129* PENDING  K 3.4* PENDING  CL 92* PENDING  CO2 25 PENDING  GLUCOSE 138* PENDING  BUN 13 PENDING  CREATININE 0.80 PENDING  CALCIUM 9.5 PENDING  MG 1.8  --    GFR: CrCl cannot be calculated (This lab value cannot be used to calculate CrCl because it is not a number: PENDING). Liver Function Tests: Recent Labs  Lab 03/23/20 0638  AST 19  ALT 15  ALKPHOS 41  BILITOT 0.5  PROT 8.3*  ALBUMIN 3.2*   Recent Labs  Lab 03/23/20 0638   LIPASE 41   No results for input(s): AMMONIA in the last 168 hours. Coagulation Profile: No results for input(s): INR, PROTIME in the last 168 hours. Cardiac Enzymes: No results for input(s): CKTOTAL, CKMB, CKMBINDEX, TROPONINI in the last 168 hours. BNP (last 3 results) No results for input(s): PROBNP in the last 8760 hours. HbA1C: No results for input(s): HGBA1C in the last 72 hours. CBG: No results for input(s): GLUCAP in the last 168 hours. Lipid Profile: No results for input(s): CHOL, HDL, LDLCALC, TRIG, CHOLHDL, LDLDIRECT in the last 72 hours. Thyroid Function Tests: Recent Labs    03/23/20 1157  TSH 2.938   Anemia Panel: No results for input(s): VITAMINB12, FOLATE, FERRITIN, TIBC, IRON, RETICCTPCT in the last 72 hours. Urine analysis:    Component Value Date/Time   COLORURINE YELLOW (A) 03/23/2020 1046   APPEARANCEUR CLOUDY (A) 03/23/2020 1046   LABSPEC 1.011 03/23/2020 1046   PHURINE 7.0 03/23/2020 1046   GLUCOSEU NEGATIVE 03/23/2020 1046   HGBUR NEGATIVE 03/23/2020 1046   BILIRUBINUR NEGATIVE 03/23/2020 1046   KETONESUR NEGATIVE 03/23/2020 1046   PROTEINUR NEGATIVE 03/23/2020 1046   NITRITE NEGATIVE 03/23/2020 1046   LEUKOCYTESUR NEGATIVE 03/23/2020 1046   Sepsis Labs: @LABRCNTIP (procalcitonin:4,lacticidven:4) ) Recent Results (from the past 240 hour(s))  Resp Panel by RT-PCR (Flu A&B, Covid) Nasopharyngeal Swab     Status: None   Collection Time: 03/23/20 10:44 AM   Specimen: Nasopharyngeal Swab; Nasopharyngeal(NP) swabs in vial transport medium  Result Value Ref Range Status   SARS Coronavirus 2 by RT PCR NEGATIVE NEGATIVE Final    Comment: (NOTE) SARS-CoV-2 target nucleic acids are NOT DETECTED.  The SARS-CoV-2 RNA is generally detectable in upper respiratory specimens during the acute phase of infection. The lowest concentration of SARS-CoV-2 viral copies this assay can detect is 138 copies/mL. A negative result does not preclude  SARS-Cov-2 infection and should not be used as the sole basis for treatment or other patient management decisions. A negative result may occur with  improper specimen collection/handling, submission of specimen other than nasopharyngeal swab, presence of viral mutation(s) within the areas targeted by this assay, and inadequate number of viral copies(<138 copies/mL). A negative result must be combined with clinical observations, patient history, and epidemiological information. The expected result is Negative.  Fact Sheet for Patients:  EntrepreneurPulse.com.au  Fact Sheet for Healthcare Providers:  IncredibleEmployment.be  This test is no t yet approved or cleared by the  [place] [employer] and  has been authorized for detection and/or diagnosis of SARS-CoV-2 by FDA under an Print production planner (EUA). This EUA will remain  in effect (meaning this test can be used) for the duration of the COVID-19 declaration under Section 564(b)(1) of the Act, 21 U.S.C.section 360bbb-3(b)(1), unless the authorization is terminated  or revoked sooner.       Influenza A by PCR NEGATIVE NEGATIVE Final   Influenza B by PCR NEGATIVE NEGATIVE Final    Comment: (NOTE) The Xpert Xpress SARS-CoV-2/FLU/RSV plus assay is intended as an aid in the diagnosis of influenza from Nasopharyngeal swab specimens and should not be used as a sole basis for treatment. Nasal washings and aspirates are unacceptable for Xpert Xpress SARS-CoV-2/FLU/RSV testing.  Fact Sheet for Patients: EntrepreneurPulse.com.au  Fact Sheet for Healthcare Providers: IncredibleEmployment.be  This test is not yet approved or cleared by the [place] FDA and has been authorized for detection and/or diagnosis of SARS-CoV-2 by FDA under an Emergency Use Authorization (EUA). This EUA will remain in effect (meaning this test can be used) for the duration of  the COVID-19 declaration under Section 564(b)(1) of the Act, 21 U.S.C. section 360bbb-3(b)(1), unless the authorization is terminated or revoked.  Performed at [facility], [address], [place], [place] [address]      Radiological Exams on Admission: CT ABDOMEN PELVIS WO CONTRAST  Result Date: 03/23/2020 CLINICAL DATA:  Acute abdominal pain, lower abdominal pain since Sunday, history of diverticulitis, states pain feels the same, syncopal episode this morning, history hypertension EXAM: CT ABDOMEN AND PELVIS WITHOUT CONTRAST TECHNIQUE: Multidetector CT imaging of the abdomen and pelvis was performed following the standard protocol without IV contrast. Sagittal and coronal MPR images reconstructed from axial data set. IV contrast not administered due to history of contrast allergy. No oral contrast was administered. COMPARISON:  09/19/2016 FINDINGS: Lower chest: Mild bibasilar atelectasis Hepatobiliary: Gallbladder and liver normal appearance Pancreas: Few scattered pancreatic calcifications without mass Spleen: Normal appearance Adrenals/Urinary Tract: BILATERAL adrenal thickening without focal mass. Tiny nonobstructing RIGHT renal calculus image 29. Exophytic 10 x 6 mm nodule at upper to mid LEFT kidney posteriorly, indeterminate. Probable small cyst more inferiorly within LEFT kidney laterally 11 x 10 mm image 24. No hydronephrosis, ureteral calcification or ureteral dilatation. Bladder unremarkable. Stomach/Bowel: Scattered slightly prominent stool throughout colon. Appendix not visualized, but no pericecal inflammatory process seen. Diverticulosis of descending and sigmoid colon with wall thickening and pericolic inflammatory changes at distal descending colon consistent with acute diverticulitis. No evidence of extraluminal gas or abscess to suggest perforation. Stomach is under distended, unable to accurately assess wall thickness. Small hiatal hernia. Focal gas and fluid collection  identified posterior to the upper stomach, suspect gastric diverticulum, unchanged. Small bowel loops unremarkable. Vascular/Lymphatic: Extensive atherosclerotic calcifications aorta iliac arteries. Aorta normal caliber. Scattered pelvic phleboliths. No adenopathy. Reproductive: Uterus surgically absent. Nonvisualization of ovaries. Other: No free air or free fluid.  No additional hernia. Musculoskeletal: Marked osseous demineralization. Chronic compression fracture and vertebroplasty of L2. IMPRESSION: Acute diverticulitis of the distal descending colon without evidence of perforation or abscess. Small hiatal hernia and probable small posterior diverticulum of the upper stomach. Tiny nonobstructing RIGHT renal calculus. 10 x 6 mm exophytic nodule at upper to mid LEFT kidney posteriorly, indeterminate in character and slightly increased in size since 2018; follow-up non emergent characterization by MR imaging with and without contrast recommended. Chronic L2 compression fracture and prior vertebroplasty. Aortic Atherosclerosis (ICD10-I70.0). Electronically Signed   By:  Lavonia Dana M.D.   On: 03/23/2020 08:50   CT HEAD WO CONTRAST  Result Date: 03/23/2020 CLINICAL DATA:  Syncope. EXAM: CT HEAD WITHOUT CONTRAST TECHNIQUE: Contiguous axial images were obtained from the base of the skull through the vertex without intravenous contrast. COMPARISON:  CT head February 10, 2018.  MRI February 11, 2018. FINDINGS: Brain: No evidence of acute large vascular territory infarction, hemorrhage, hydrocephalus, extra-axial collection or mass lesion/mass effect. Similar appearance of an old lacunar infarct versus dilated perivascular space in the right basal ganglia. Similar moderate to advanced patchy white matter matter hypoattenuation, likely related to chronic microvascular ischemic disease. Patchy areas of hypoattenuation within the basal ganglia bilaterally likely is in part secondary to dilated perivascular spaces that  were better characterized on prior MRI. Vascular: Calcific atherosclerosis. Skull: No acute fracture. Sinuses/Orbits: Scattered ethmoid air cell mucosal thickening and partially imaged right maxillary sinus mucosal thickening. No air-fluid levels. Unremarkable orbits. Other: Small bilateral mastoid effusions. IMPRESSION: 1. No evidence of acute intracranial abnormality. 2. Moderate to advanced chronic microvascular ischemic disease. Electronically Signed   By: Margaretha Sheffield MD   On: 03/23/2020 13:11     EKG: I have personally reviewed.  Sinus rhythm, QTC 418, nonspecific T wave change.  Assessment/Plan Principal Problem:   Acute diverticulitis Active Problems:   Iron deficiency anemia   IgA monoclonal gammopathy   Pernicious anemia   Hyponatremia   Hypokalemia   Nodule of kidney   Syncope   Acute diverticulitis: CT scan showed distal colon diverticulitis without evidence of perforation or abscess formation.  This is a recurrent issue.  Patient does not have fever or leukocytosis.  Not septic clinically.  Hemodynamically stable currently.  Patient has multiple severe allergy to antibiotics including penicillin and fluoroquinolone.  Consulted pharmacist and ID, Dr. Steva Ready, since patient used Keflex before, Dr. Steva Ready recommended to start Rocephin.  -Placed on MedSurg bed for observation -IV Flagyl and rocephin -Blood culture -IV fluid: 1 L LR, followed by 75 cc/h of normal saline -As needed Zofran  Iron deficiency anemia and pernicious anemia: Hgb 12.2 -Continue iron supplement and vitamin B12 supplement  IgA monoclonal gammopathy:  -pt is following up with oncology  Hyponatremia: Most likely due to poor oral intake and continuation of diuretics, hypotension  - Will check urine sodium, urine osmolality, serum osmolality. - check TSH - IVF: 1L LR in ED, will continue with IV normal saline at 75 mL/h - f/u by BMP q8h - avoid over correction too fast due to risk of  central pontine myelinolysis -Hold Hygroton  Hypokalemia: K= 3.4  on admission. - Repleted  K - Check Mg level -->1.8  Nodule of kidney: This is an incidental finding by CT scan, showed a left kidney nodule. -Need to be followed up with urology and PCP  Syncope: CT of head is negative for acute intracranial abnormalities.  No focal deficit on physical examination.  Possibly due to dehydration and orthostatic status or vasovagal syncope. -PT/OT -IV fluid as above -Check orthostatic vital sign         DVT ppx: SQ Lovenox Code Status: Partial code (I discussed with the patient, and explained the meaning of CODE STATUS, patient wants to be partial code, OK for CPR, but no intubation).  Family Communication: not done, no family member is at bed side.   Disposition Plan:  Anticipate discharge back to previous environment Consults called:  Dr. Steva Ready of infectious disease Admission status: Med-surg bed for obs  Status is: Observation  The patient remains OBS appropriate and will d/c before 2 midnights.  Dispo: The patient is from: Home              Anticipated d/c is to: Home              Anticipated d/c date is: 1 day              Patient currently is not medically stable to d/c.         Date of Service 03/23/2020    [facility]   If 7PM-7AM, please contact night-coverage [email or web address] 03/23/2020, 6:47 PM

## 2020-03-23 NOTE — ED Provider Notes (Signed)
[facility] Emergency Department Provider Note  ____________________________________________  Time seen: Approximately 10:32 AM  I have reviewed the triage vital signs and the nursing notes.   HISTORY  Chief Complaint Loss of Consciousness    HPI Janet Duffy is a 86 y.o. female with a history of diverticulitis, hypertension who comes ED complaining of left lower quadrant abdominal pain for the past 2 days, gradual onset, worsening, now severe, no aggravating or alleviating factors, nonradiating.  Associated with nausea, no vomiting or diarrhea or bloody stool.      Past Medical History:  Diagnosis Date  . Connective tissue disease ([place])   . DDD (degenerative disc disease), lumbar   . Diverticulitis   . Granuloma annulare   . HTN (hypertension)   . Osteoporosis      Patient Active Problem List   Diagnosis Date Noted  . Hyponatremia 03/23/2020  . Hypokalemia 03/23/2020  . Nodule of kidney 03/23/2020  . Syncope 03/23/2020  . Copper deficiency 05/26/2019  . Pernicious anemia 02/21/2019  . B12 deficiency 11/09/2018  . Leukopenia 10/26/2018  . Iron deficiency anemia 10/26/2018  . IgA monoclonal gammopathy 10/26/2018  . Left arm weakness 02/10/2018  . Acute diverticulitis 06/18/2016     History reviewed. No pertinent surgical history.   Prior to Admission medications   Medication Sig Start Date End Date Taking? Authorizing Provider  acetaminophen (TYLENOL) 325 MG tablet Take 2 tablets (650 mg total) by mouth every 6 (six) hours as needed for mild pain (or Fever >/= 101). 09/21/16  Yes Dustin Flock, MD  aspirin EC 81 MG EC tablet Take 1 tablet (81 mg total) by mouth daily. 02/13/18  Yes Epifanio Lesches, MD  carvedilol (COREG) 25 MG tablet Take 25 mg by mouth 2 (two) times daily. 05/18/16  Yes [provider]  chlorthalidone (HYGROTON) 25 MG tablet Take 25 mg by mouth daily as needed.   Yes [provider]   Cholecalciferol (VITAMIN D3) 1000 units CAPS Take 1,000 Units by mouth daily.   Yes [provider]  ferrous sulfate 325 (65 FE) MG tablet Take 325 mg by mouth daily with breakfast.   Yes [provider]  hydrALAZINE (APRESOLINE) 50 MG tablet Take 1 tablet (50 mg total) by mouth 2 (two) times daily. Patient taking differently: Take 50 mg by mouth See admin instructions. Take 1 tablet (50mg ) by mouth twice daily - may take 1 tablet (50mg ) at lunchtime if needed 02/12/18  Yes Epifanio Lesches, MD  lisinopril (PRINIVIL,ZESTRIL) 40 MG tablet Take 1 tablet (40 mg total) by mouth daily. 02/13/18  Yes Epifanio Lesches, MD  vitamin B-12 (CYANOCOBALAMIN) 1000 MCG tablet Take 1,000 mcg by mouth daily.   Yes [provider]  Wheat Dextrin (BENEFIBER) POWD Take 1 Dose by mouth daily.   Yes [provider]     Allergies Clindamycin/lincomycin, Codeine, Erythromycin, Ivp dye [iodinated diagnostic agents], Metrizamide, Monosodium glutamate, Moxifloxacin, Penicillins, Tramadol, Amlodipine, Avelox [moxifloxacin hcl in nacl], Shellfish allergy, and Zoster vaccine live   No family history on file.  Social History Social History   Tobacco Use  . Smoking status: Never Smoker  . Smokeless tobacco: Never Used  Vaping Use  . Vaping Use: Never used  Substance Use Topics  . Alcohol use: No  . Drug use: No    Review of Systems  Constitutional:   No fever or chills.  ENT:   No sore throat. No rhinorrhea. Cardiovascular:   No chest pain or syncope. Respiratory:   No  dyspnea or cough. Gastrointestinal:   Positive as above for left lower quadrant abdominal pain without vomiting and diarrhea.  Musculoskeletal:   Negative for focal pain or swelling All other systems reviewed and are negative except as documented above in ROS and HPI.  ____________________________________________   PHYSICAL EXAM:  VITAL SIGNS: ED Triage Vitals [03/23/20 0632]  Enc Vitals  Group     BP (!) 141/72     Pulse Rate 66     Resp 18     Temp 98.5 F (36.9 C)     Temp Source Oral     SpO2 96 %     Weight 120 lb (54.4 kg)     Height 5\' 3"  (1.6 m)     Head Circumference      Peak Flow      Pain Score 9     Pain Loc      Pain Edu?      Excl. in [place]?     Vital signs reviewed, nursing assessments reviewed.   Constitutional:   Alert and oriented. Non-toxic appearance. Eyes:   Conjunctivae are normal. EOMI. PERRL. ENT      Head:   Normocephalic and atraumatic.      Nose:   Wearing a mask.      Mouth/Throat:   Wearing a mask.      Neck:   No meningismus. Full ROM. Hematological/Lymphatic/Immunilogical:   No cervical lymphadenopathy. Cardiovascular:   RRR. Symmetric bilateral radial and DP pulses.  No murmurs. Cap refill less than 2 seconds. Respiratory:   Normal respiratory effort without tachypnea/retractions. Breath sounds are clear and equal bilaterally. No wheezes/rales/rhonchi. Gastrointestinal:   Soft with pronounced left-sided abdominal tenderness and suprapubic tenderness. Non distended. There is no CVA tenderness.  No rebound, rigidity, or guarding.  Musculoskeletal:   Normal range of motion in all extremities. No joint effusions.  No lower extremity tenderness.  No edema. Neurologic:   Normal speech and language.  Motor grossly intact. No acute focal neurologic deficits are appreciated.  Skin:    Skin is warm, dry and intact. No rash noted.  No petechiae, purpura, or bullae.  ____________________________________________    LABS (pertinent positives/negatives) (all labs ordered are listed, but only abnormal results are displayed) Labs Reviewed  BASIC METABOLIC PANEL - Abnormal; Notable for the following components:      Result Value   Sodium 129 (*)    Potassium 3.4 (*)    Chloride 92 (*)    Glucose, Bld 138 (*)    All other components within normal limits  CBC - Abnormal; Notable for the following components:   RBC 3.83 (*)    All other  components within normal limits  HEPATIC FUNCTION PANEL - Abnormal; Notable for the following components:   Total Protein 8.3 (*)    Albumin 3.2 (*)    All other components within normal limits  RESP PANEL BY RT-PCR (FLU A&B, COVID) ARPGX2  CULTURE, BLOOD (ROUTINE X 2)  CULTURE, BLOOD (ROUTINE X 2)  LIPASE, BLOOD  MAGNESIUM  URINALYSIS, COMPLETE (UACMP) WITH MICROSCOPIC  BRAIN NATRIURETIC PEPTIDE  OSMOLALITY, URINE  OSMOLALITY  SODIUM, URINE, RANDOM  BASIC METABOLIC PANEL  BASIC METABOLIC PANEL  TSH  CBG MONITORING, ED   ____________________________________________   EKG    ____________________________________________    RADIOLOGY  CT ABDOMEN PELVIS WO CONTRAST  Result Date: 03/23/2020 CLINICAL DATA:  Acute abdominal pain, lower abdominal pain since Sunday, history of diverticulitis, states pain feels the same, syncopal episode this  morning, history hypertension EXAM: CT ABDOMEN AND PELVIS WITHOUT CONTRAST TECHNIQUE: Multidetector CT imaging of the abdomen and pelvis was performed following the standard protocol without IV contrast. Sagittal and coronal MPR images reconstructed from axial data set. IV contrast not administered due to history of contrast allergy. No oral contrast was administered. COMPARISON:  09/19/2016 FINDINGS: Lower chest: Mild bibasilar atelectasis Hepatobiliary: Gallbladder and liver normal appearance Pancreas: Few scattered pancreatic calcifications without mass Spleen: Normal appearance Adrenals/Urinary Tract: BILATERAL adrenal thickening without focal mass. Tiny nonobstructing RIGHT renal calculus image 29. Exophytic 10 x 6 mm nodule at upper to mid LEFT kidney posteriorly, indeterminate. Probable small cyst more inferiorly within LEFT kidney laterally 11 x 10 mm image 24. No hydronephrosis, ureteral calcification or ureteral dilatation. Bladder unremarkable. Stomach/Bowel: Scattered slightly prominent stool throughout colon. Appendix not visualized, but no  pericecal inflammatory process seen. Diverticulosis of descending and sigmoid colon with wall thickening and pericolic inflammatory changes at distal descending colon consistent with acute diverticulitis. No evidence of extraluminal gas or abscess to suggest perforation. Stomach is under distended, unable to accurately assess wall thickness. Small hiatal hernia. Focal gas and fluid collection identified posterior to the upper stomach, suspect gastric diverticulum, unchanged. Small bowel loops unremarkable. Vascular/Lymphatic: Extensive atherosclerotic calcifications aorta iliac arteries. Aorta normal caliber. Scattered pelvic phleboliths. No adenopathy. Reproductive: Uterus surgically absent. Nonvisualization of ovaries. Other: No free air or free fluid.  No additional hernia. Musculoskeletal: Marked osseous demineralization. Chronic compression fracture and vertebroplasty of L2. IMPRESSION: Acute diverticulitis of the distal descending colon without evidence of perforation or abscess. Small hiatal hernia and probable small posterior diverticulum of the upper stomach. Tiny nonobstructing RIGHT renal calculus. 10 x 6 mm exophytic nodule at upper to mid LEFT kidney posteriorly, indeterminate in character and slightly increased in size since 2018; follow-up non emergent characterization by MR imaging with and without contrast recommended. Chronic L2 compression fracture and prior vertebroplasty. Aortic Atherosclerosis (ICD10-I70.0). Electronically Signed   By: Lavonia Dana M.D.   On: 03/23/2020 08:50    ____________________________________________   PROCEDURES Procedures  ____________________________________________  DIFFERENTIAL DIAGNOSIS   Diverticulitis, intra-abdominal abscess, bowel perforation, bowel obstruction  CLINICAL IMPRESSION / ASSESSMENT AND PLAN / ED COURSE  Medications ordered in the ED: Medications  metroNIDAZOLE (FLAGYL) tablet 500 mg (500 mg Oral Given 03/23/20 1041)  morphine 2  MG/ML injection 2 mg (has no administration in time range)  ondansetron (ZOFRAN) injection 4 mg (has no administration in time range)  acetaminophen (TYLENOL) tablet 650 mg (has no administration in time range)  hydrALAZINE (APRESOLINE) injection 5 mg (has no administration in time range)  0.9 %  sodium chloride infusion (has no administration in time range)  potassium chloride SA (KLOR-CON) CR tablet 40 mEq (has no administration in time range)  lactated ringers bolus 1,000 mL (1,000 mLs Intravenous New Bag/Given 03/23/20 1001)  ondansetron (ZOFRAN) injection 4 mg (4 mg Intravenous Given 03/23/20 0956)  morphine 2 MG/ML injection 2 mg (2 mg Intravenous Given 03/23/20 0956)    Pertinent labs & imaging results that were available during my care of the patient were reviewed by me and considered in my medical decision making (see chart for details).  Janet Duffy was evaluated in Emergency Department on 03/23/2020 for the symptoms described in the history of present illness. She was evaluated in the context of the global COVID-19 pandemic, which necessitated consideration that the patient might be at risk for infection with the SARS-CoV-2 virus that causes COVID-19. Institutional protocols and algorithms that pertain  to the evaluation of patients at risk for COVID-19 are in a state of rapid change based on information released by regulatory bodies including the CDC and federal and state organizations. These policies and algorithms were followed during the patient's care in the ED.   Patient presents with left lower quadrant abdominal pain with significant tenderness on exam.  Vital signs unremarkable.  Labs unremarkable except hyponatremia.  CT scan does show uncomplicated diverticulitis.  With patient's dehydration and syncope, persistent pain, will plan to admit.  Case discussed with hospitalist.      ____________________________________________   FINAL CLINICAL IMPRESSION(S) / ED  DIAGNOSES    Final diagnoses:  Acute diverticulitis  Dehydration     ED Discharge Orders    None      Portions of this note were generated with dragon dictation software. Dictation errors may occur despite best attempts at proofreading.   Carrie Mew, MD 03/23/20 1118

## 2020-03-24 DIAGNOSIS — K5792 Diverticulitis of intestine, part unspecified, without perforation or abscess without bleeding: Secondary | ICD-10-CM | POA: Diagnosis not present

## 2020-03-24 LAB — BASIC METABOLIC PANEL
Anion gap: 10 (ref 5–15)
BUN: 12 mg/dL (ref 8–23)
CO2: 26 mmol/L (ref 22–32)
Calcium: 8.6 mg/dL — ABNORMAL LOW (ref 8.9–10.3)
Chloride: 96 mmol/L — ABNORMAL LOW (ref 98–111)
Creatinine, Ser: 0.76 mg/dL (ref 0.44–1.00)
GFR, Estimated: >60 mL/min (ref >60)
Glucose, Bld: 104 mg/dL — ABNORMAL HIGH (ref 70–99)
Potassium: 3.1 mmol/L — ABNORMAL LOW (ref 3.5–5.1)
Sodium: 132 mmol/L — ABNORMAL LOW (ref 135–145)

## 2020-03-24 MED ORDER — METRONIDAZOLE 500 MG PO TABS: 500.0000 mg | ORAL_TABLET | Freq: Three times a day (TID) | ORAL | 0 refills | Status: AC

## 2020-03-24 MED ORDER — CEFDINIR 300 MG PO CAPS: 300.0000 mg | ORAL_CAPSULE | Freq: Two times a day (BID) | ORAL | 0 refills | Status: AC

## 2020-03-24 MED ORDER — CEFDINIR 300 MG PO CAPS: 300.0000 mg | ORAL_CAPSULE | Freq: Two times a day (BID) | ORAL | Status: DC

## 2020-03-24 MED ORDER — POTASSIUM CHLORIDE CRYS ER 20 MEQ PO TBCR
40.0000 meq | EXTENDED_RELEASE_TABLET | Freq: Once | ORAL | Status: AC
  Administered 2020-03-24: 40 meq via ORAL
  Filled 2020-03-24: qty 2

## 2020-03-24 NOTE — Progress Notes (Signed)
OT Cancellation Note  Patient Details Name: Janet Duffy MRN: [id number] DOB: [birth year]   Cancelled Treatment:    Reason Eval/Treat Not Completed: OT screened, no needs identified, will sign off. Order received, chart reviewed. Per conversation c PT, pt completed toileting and standing grooming c MOD I. Pt is near baseline functional independence. No skilled acute OT needs identified. Will sign off. Please re-consult if additional needs arise.   Dessie Coma, M.S. OTR/L  03/24/20, 9:59 AM  ascom [phone]

## 2020-03-24 NOTE — Evaluation (Addendum)
Physical Therapy Evaluation Patient Details Name: Janet Duffy MRN: [id number] DOB: [birth year] Today's Date: 03/24/2020   History of Present Illness  Patient is an 86 year old  female with a history of diverticulitis, hypertension who comes ED complaining of left lower quadrant abdominal pain and found to have active diverticulitis. Patient had a syncopal episode thought to be due to dehydration or orthostatic vs vasovagal issues.  Clinical Impression  PT evaluation completed. Patient required no physical assistance with mobility. Orthostatic vitals taken, supine- 146/73, sitting 141/67, standing 0 minute 118/63, standing 3 minutes 122/58. Patient is not symptomatic and reports no dizziness with activity. Patient complains of left knee pain of 8/10 with bruising noted (patient reports bruise is unrelated to current events) and mild swelling noted just proximal to knee. No tenderness to palpation and patient reports knee pain improved with mobility. Patient ambulated with and without rolling walker and has improved gait pattern with use of rolling walker. Recommend outpatient PT if left knee symptoms persist for continued gait training without assistive device.     Follow Up Recommendations Outpatient PT    Equipment Recommendations  Rolling walker with 5" wheels    Recommendations for Other Services       Precautions / Restrictions Precautions Precautions: Fall Restrictions Weight Bearing Restrictions: No      Mobility  Bed Mobility Overal bed mobility: Modified Independent                  Transfers Overall transfer level: Needs assistance Equipment used: None Transfers: Sit to/from Stand Sit to Stand: Supervision         General transfer comment: supervision for safety with sit to stand transfers  Ambulation/Gait Ambulation/Gait assistance: Supervision Gait Distance (Feet): 45 Feet Assistive device: Rolling walker (2 wheeled) (with and without rolling  walker) Gait Pattern/deviations: Decreased stance time - left;Decreased stride length;Antalgic Gait velocity: decreased   General Gait Details: patient ambulated 83ft without assistive device with decreased step length and pronounced antalgic gait. with rolling walker for support patient has improvement in gait pattern. recommend to use rolling walker with ambulation with left knee pain. minimal verbal cues required for technique using rolling walker.  Stairs            Wheelchair Mobility    Modified Rankin (Stroke Patients Only)       Balance Overall balance assessment: Needs assistance Sitting-balance support: Feet supported Sitting balance-Leahy Scale: Normal     Standing balance support: During functional activity;No upper extremity supported Standing balance-Leahy Scale: Good Standing balance comment: patient able to maintain standing balance while brushing teeth and washing hands at sink without loss of balance.                             Pertinent Vitals/Pain Pain Assessment: 0-10 Pain Score: 8  Pain Location: left knee Pain Descriptors / Indicators: Sore Pain Intervention(s): Monitored during session    Home Living Family/patient expects to be discharged to:: Private residence Living Arrangements: Spouse/significant other Available Help at Discharge: Family Type of Home: House Home Access: Level entry       Home Equipment: Kasandra Knudsen - single point      Prior Function Level of Independence: Independent               Hand Dominance   Dominant Hand: Right    Extremity/Trunk Assessment   Upper Extremity Assessment Upper Extremity Assessment: Overall WFL for tasks assessed  Lower Extremity Assessment Lower Extremity Assessment: LLE deficits/detail (RLE WNL observed with functional activity) LLE Deficits / Details: patient with mild proximal left knee swelling and bruising noted. patient complains of pain with active movement with  AROM WFL. patient reports pain decreased with active movement/ambulation. no knee buckling with weight bearing LLE Sensation: WNL       Communication   Communication: No difficulties  Cognition Arousal/Alertness: Awake/alert Behavior During Therapy: WFL for tasks assessed/performed Overall Cognitive Status: Within Functional Limits for tasks assessed                                        General Comments      Exercises     Assessment/Plan    PT Assessment Patient needs continued PT services  PT Problem List Decreased strength;Decreased activity tolerance;Decreased mobility;Decreased knowledge of use of DME;Pain       PT Treatment Interventions DME instruction;Gait training;Stair training;Functional mobility training;Therapeutic activities;Therapeutic exercise;Balance training    PT Goals (Current goals can be found in the Care Plan section)  Acute Rehab PT Goals Patient Stated Goal: to go home PT Goal Formulation: With patient Time For Goal Achievement: 04/07/20 Potential to Achieve Goals: Good    Frequency Min 2X/week   Barriers to discharge        Co-evaluation               AM-PAC PT "6 Clicks" Mobility  Outcome Measure Help needed turning from your back to your side while in a flat bed without using bedrails?: None Help needed moving from lying on your back to sitting on the side of a flat bed without using bedrails?: None Help needed moving to and from a bed to a chair (including a wheelchair)?: A Little Help needed standing up from a chair using your arms (e.g., wheelchair or bedside chair)?: A Little Help needed to walk in hospital room?: A Little Help needed climbing 3-5 steps with a railing? : A Little 6 Click Score: 20    End of Session Equipment Utilized During Treatment: Gait belt Activity Tolerance: Patient tolerated treatment well Patient left: in bed;with call bell/phone within reach;with bed alarm set Nurse Communication:  Mobility status PT Visit Diagnosis: Other abnormalities of gait and mobility (R26.89);Pain Pain - Right/Left: Left Pain - part of body: Knee    Time: [id number] PT Time Calculation (min) (ACUTE ONLY): 34 min   Charges:   PT Evaluation $PT Eval Moderate Complexity: 1 Mod PT Treatments $Gait Training: 23-37 mins       Minna Merritts, PT, MPT   Percell Locus 03/24/2020, 9:46 AM

## 2020-03-24 NOTE — Discharge Summary (Signed)
[facility] [place] [facility]   PATIENT NAME: Janet StackBonnie Duffy    MR#:  [id number]  DATE OF BIRTH:  1931/06/29  DATE OF ADMISSION:  03/23/2020 ADMITTING PHYSICIAN: Lorretta HarpXilin Niu, MD  DATE OF DISCHARGE: 03/24/2020  PRIMARY CARE PHYSICIAN: Orene DesanctisBehling, Karen, MD    ADMISSION DIAGNOSIS:  Dehydration [E86.0] Acute diverticulitis [K57.92]  DISCHARGE DIAGNOSIS:  acute left-sided diverticulitis.  SECONDARY DIAGNOSIS:   Past Medical History:  Diagnosis Date  . Connective tissue disease (HCC)   . DDD (degenerative disc disease), lumbar   . Diverticulitis   . Granuloma annulare   . HTN (hypertension)   . Osteoporosis     HOSPITAL COURSE:  Janet StackBonnie Castelli is a 86 y.o. female with medical history significant of hypertension, diverticulitis, IgA monoclonal, uptake, anemia, hard of hearing, hypertension, who presents with abdominal pain. Patient states that she has been having abdominal pain for almost 3 days, which is located in lower abdomen, constant, moderate, sharp, nonradiating.  Acute diverticulitis: - CT scan showed distal colon diverticulitis without evidence of perforation or abscess formation.  This is a recurrent issue.  Patient does not have fever or leukocytosis.  Not septic clinically.  Hemodynamically stable currently.  Patient has multiple severe allergy to antibiotics including penicillin and fluoroquinolone.  Consulted pharmacist and ID, Dr. Joylene Draftavisankar, since patient used Keflex before, Dr. Joylene Draftavisankar recommended to start Rocephin and flagyl-- patient tolerated well. She is tolerating soft diet. Had a regular BM today. She feels a lot better. Nurse have some discomfort in the left lower quadrant however much improved since yesterday. -As needed Zofran -- no fever. White count normal. Patient feels okay to go home. Switch to oral antibiotic for 27 days. Discussed with ID agrees with plan.  Iron deficiency anemia and pernicious anemia: Hgb 12.2 -Continue iron  supplement and vitamin B12 supplement  IgA monoclonal gammopathy:  -pt is following up with oncology  Hyponatremia: Most likely due to poor oral intake and continuation of diuretics, hypotension -- received IV fluids and improved.  Hypokalemia: K= 3.4  on admission. - Repleted  K -  Mg level -->1.8  Nodule of kidney: This is an incidental finding by CT scan, showed a left kidney nodule. -Need to be followed up with urology and PCP  Syncope:  suspected vasovagal in the setting of pain, dehydration and nausea. She remains in sinus rhythm. Blood pressure stable. Ambulated with physical therapy and work with OT. No PT OT needs at home.  --CT of head is negative for acute intracranial abnormalities.  No focal deficit on physical examination.     Overall stable. Will discharge to home. Patient says her husband is well informed by her. CONSULTS OBTAINED:    DRUG ALLERGIES:   Allergies  Allergen Reactions  . Clindamycin/Lincomycin Anaphylaxis  . Codeine Nausea Only  . Erythromycin Anaphylaxis  . Ivp Dye [Iodinated Diagnostic Agents] Anaphylaxis  . Metrizamide Anaphylaxis  . Monosodium Glutamate Shortness Of Breath and Swelling    Tongue swelling  . Moxifloxacin Anaphylaxis  . Penicillins Anaphylaxis and Rash    .Has patient had a PCN reaction causing immediate rash, facial/tongue/throat swelling, SOB or lightheadedness with hypotension: Yes Has patient had a PCN reaction causing severe rash involving mucus membranes or skin necrosis: No Has patient had a PCN reaction that required hospitalization: Unknown Has patient had a PCN reaction occurring within the last 10 years: Yes If all of the above answers are "NO", then may proceed with Cephalosporin use.   . Tramadol  Patient discloses she "went crazy"  . Amlodipine Swelling  . Avelox [Moxifloxacin Hcl In Nacl]   . Shellfish Allergy Hives    seafood  . Zoster Vaccine Live Rash    DISCHARGE MEDICATIONS:   Allergies  as of 03/24/2020      Reactions   Clindamycin/lincomycin Anaphylaxis   Codeine Nausea Only   Erythromycin Anaphylaxis   Ivp Dye [iodinated Diagnostic Agents] Anaphylaxis   Metrizamide Anaphylaxis   Monosodium Glutamate Shortness Of Breath, Swelling   Tongue swelling   Moxifloxacin Anaphylaxis   Penicillins Anaphylaxis, Rash   .Has patient had a PCN reaction causing immediate rash, facial/tongue/throat swelling, SOB or lightheadedness with hypotension: Yes Has patient had a PCN reaction causing severe rash involving mucus membranes or skin necrosis: No Has patient had a PCN reaction that required hospitalization: Unknown Has patient had a PCN reaction occurring within the last 10 years: Yes If all of the above answers are "NO", then may proceed with Cephalosporin use.   Tramadol    Patient discloses she "went crazy"   Amlodipine Swelling   Avelox [moxifloxacin Hcl In Nacl]    Shellfish Allergy Hives   seafood   Zoster Vaccine Live Rash      Medication List    TAKE these medications   acetaminophen 325 MG tablet Commonly known as: TYLENOL Take 2 tablets (650 mg total) by mouth every 6 (six) hours as needed for mild pain (or Fever >/= 101).   aspirin 81 MG EC tablet Take 1 tablet (81 mg total) by mouth daily.   Benefiber Powd Take 1 Dose by mouth daily.   carvedilol 25 MG tablet Commonly known as: COREG Take 25 mg by mouth 2 (two) times daily.   cefdinir 300 MG capsule Commonly known as: OMNICEF Take 1 capsule (300 mg total) by mouth every 12 (twelve) hours for 5 days. Start taking on: March 25, 2020   chlorthalidone 25 MG tablet Commonly known as: HYGROTON Take 25 mg by mouth daily as needed.   ferrous sulfate 325 (65 FE) MG tablet Take 325 mg by mouth daily with breakfast.   hydrALAZINE 50 MG tablet Commonly known as: APRESOLINE Take 1 tablet (50 mg total) by mouth 2 (two) times daily. What changed:  when to take this additional instructions   lisinopril  40 MG tablet Commonly known as: ZESTRIL Take 1 tablet (40 mg total) by mouth daily.   metroNIDAZOLE 500 MG tablet Commonly known as: FLAGYL Take 1 tablet (500 mg total) by mouth every 8 (eight) hours for 5 days.   vitamin B-12 1000 MCG tablet Commonly known as: CYANOCOBALAMIN Take 1,000 mcg by mouth daily.   Vitamin D3 25 MCG (1000 UT) Caps Take 1,000 Units by mouth daily.       If you experience worsening of your admission symptoms, develop shortness of breath, life threatening emergency, suicidal or homicidal thoughts you must seek medical attention immediately by calling 911 or calling your MD immediately  if symptoms less severe.  You Must read complete instructions/literature along with all the possible adverse reactions/side effects for all the Medicines you take and that have been prescribed to you. Take any new Medicines after you have completely understood and accept all the possible adverse reactions/side effects.   Please note  You were cared for by a hospitalist during your hospital stay. If you have any questions about your discharge medications or the care you received while you were in the hospital after you are discharged, you can call the  unit and asked to speak with the hospitalist on call if the hospitalist that took care of you is not available. Once you are discharged, your primary care physician will handle any further medical issues. Please note that NO REFILLS for any discharge medications will be authorized once you are discharged, as it is imperative that you return to your primary care physician (or establish a relationship with a primary care physician if you do not have one) for your aftercare needs so that they can reassess your need for medications and monitor your lab values. Today   SUBJECTIVE   Having a better day than yesterday. Mild left lower quadrant pain. Tolerated PO diet. Had a bowel movement without any blood.  VITAL SIGNS:  Blood pressure (!)  156/65, pulse 66, temperature 98.2 F (36.8 C), temperature source Oral, resp. rate 18, height 5\' 3"  (1.6 m), weight 54.4 kg, SpO2 97 %.  I/O:    Intake/Output Summary (Last 24 hours) at 03/24/2020 1322 Last data filed at 03/24/2020 0900 Gross per 24 hour  Intake 1580.23 ml  Output --  Net 1580.23 ml    PHYSICAL EXAMINATION:  GENERAL:  86 y.o.-year-old patient lying in the bed with no acute distress.  LUNGS: Normal breath sounds bilaterally, no wheezing, rales,rhonchi or crepitation. No use of accessory muscles of respiration.  CARDIOVASCULAR: S1, S2 normal. No murmurs, rubs, or gallops.  ABDOMEN: Soft, non-tender, non-distended. Bowel sounds present. No organomegaly or mass.  EXTREMITIES: No pedal edema, cyanosis, or clubbing.  NEUROLOGIC: Cranial nerves II through XII are intact. Muscle strength 5/5 in all extremities. Sensation intact. Gait not checked.  PSYCHIATRIC: The patient is alert and oriented x 3.  SKIN: No obvious rash, lesion, or ulcer.   DATA REVIEW:   CBC  Recent Labs  Lab 03/23/20 0638  WBC 6.9  HGB 12.2  HCT 36.2  PLT 197    Chemistries  Recent Labs  Lab 03/23/20 0638 03/23/20 1157 03/24/20 0227  NA 129*   < > 132*  K 3.4*   < > 3.1*  CL 92*   < > 96*  CO2 25   < > 26  GLUCOSE 138*   < > 104*  BUN 13   < > 12  CREATININE 0.80   < > 0.76  CALCIUM 9.5   < > 8.6*  MG 1.8  --   --   AST 19  --   --   ALT 15  --   --   ALKPHOS 41  --   --   BILITOT 0.5  --   --    < > = values in this interval not displayed.    Microbiology Results   Recent Results (from the past 240 hour(s))  Resp Panel by RT-PCR (Flu A&B, Covid) Nasopharyngeal Swab     Status: None   Collection Time: 03/23/20 10:44 AM   Specimen: Nasopharyngeal Swab; Nasopharyngeal(NP) swabs in vial transport medium  Result Value Ref Range Status   SARS Coronavirus 2 by RT PCR NEGATIVE NEGATIVE Final    Comment: (NOTE) SARS-CoV-2 target nucleic acids are NOT DETECTED.  The SARS-CoV-2  RNA is generally detectable in upper respiratory specimens during the acute phase of infection. The lowest concentration of SARS-CoV-2 viral copies this assay can detect is 138 copies/mL. A negative result does not preclude SARS-Cov-2 infection and should not be used as the sole basis for treatment or other patient management decisions. A negative result may occur with  improper specimen collection/handling, submission  of specimen other than nasopharyngeal swab, presence of viral mutation(s) within the areas targeted by this assay, and inadequate number of viral copies(<138 copies/mL). A negative result must be combined with clinical observations, patient history, and epidemiological information. The expected result is Negative.  Fact Sheet for Patients:  EntrepreneurPulse.com.au  Fact Sheet for Healthcare Providers:  IncredibleEmployment.be  This test is no t yet approved or cleared by the [place] FDA and  has been authorized for detection and/or diagnosis of SARS-CoV-2 by FDA under an Emergency Use Authorization (EUA). This EUA will remain  in effect (meaning this test can be used) for the duration of the COVID-19 declaration under Section 564(b)(1) of the Act, 21 U.S.C.section 360bbb-3(b)(1), unless the authorization is terminated  or revoked sooner.       Influenza A by PCR NEGATIVE NEGATIVE Final   Influenza B by PCR NEGATIVE NEGATIVE Final    Comment: (NOTE) The Xpert Xpress SARS-CoV-2/FLU/RSV plus assay is intended as an aid in the diagnosis of influenza from Nasopharyngeal swab specimens and should not be used as a sole basis for treatment. Nasal washings and aspirates are unacceptable for Xpert Xpress SARS-CoV-2/FLU/RSV testing.  Fact Sheet for Patients: EntrepreneurPulse.com.au  Fact Sheet for Healthcare Providers: IncredibleEmployment.be  This test is not yet approved or cleared by the  [place] FDA and has been authorized for detection and/or diagnosis of SARS-CoV-2 by FDA under an Emergency Use Authorization (EUA). This EUA will remain in effect (meaning this test can be used) for the duration of the COVID-19 declaration under Section 564(b)(1) of the Act, 21 U.S.C. section 360bbb-3(b)(1), unless the authorization is terminated or revoked.  Performed at [facility], [address] [place], [place] [address]   Culture, blood (Routine X 2) w Reflex to ID Panel     Status: None (Preliminary result)   Collection Time: 03/23/20 11:57 AM   Specimen: BLOOD  Result Value Ref Range Status   Specimen Description BLOOD RIGHT [facility]  Final   Special Requests   Final    BOTTLES DRAWN AEROBIC AND ANAEROBIC Blood Culture results may not be optimal due to an inadequate volume of blood received in culture bottles   Culture   Final    NO GROWTH < 24 HOURS Performed at [facility], [address], [place], [place] [address]    Report Status PENDING  Incomplete  Culture, blood (Routine X 2) w Reflex to ID Panel     Status: None (Preliminary result)   Collection Time: 03/23/20 11:57 AM   Specimen: BLOOD  Result Value Ref Range Status   Specimen Description BLOOD RIGHT HAND  Final   Special Requests   Final    BOTTLES DRAWN AEROBIC AND ANAEROBIC Blood Culture results may not be optimal due to an inadequate volume of blood received in culture bottles   Culture   Final    NO GROWTH < 24 HOURS Performed at [facility], [address] [place], [place] [address]    Report Status PENDING  Incomplete    RADIOLOGY:  CT ABDOMEN PELVIS WO CONTRAST  Result Date: 03/23/2020 CLINICAL DATA:  Acute abdominal pain, lower abdominal pain since Sunday, history of diverticulitis, states pain feels the same, syncopal episode this morning, history hypertension EXAM: CT ABDOMEN AND PELVIS WITHOUT CONTRAST TECHNIQUE: Multidetector CT imaging of the abdomen and  pelvis was performed following the standard protocol without IV contrast. Sagittal and coronal MPR images reconstructed from axial data set. IV contrast not administered due to  history of contrast allergy. No oral contrast was administered. COMPARISON:  09/19/2016 FINDINGS: Lower chest: Mild bibasilar atelectasis Hepatobiliary: Gallbladder and liver normal appearance Pancreas: Few scattered pancreatic calcifications without mass Spleen: Normal appearance Adrenals/Urinary Tract: BILATERAL adrenal thickening without focal mass. Tiny nonobstructing RIGHT renal calculus image 29. Exophytic 10 x 6 mm nodule at upper to mid LEFT kidney posteriorly, indeterminate. Probable small cyst more inferiorly within LEFT kidney laterally 11 x 10 mm image 24. No hydronephrosis, ureteral calcification or ureteral dilatation. Bladder unremarkable. Stomach/Bowel: Scattered slightly prominent stool throughout colon. Appendix not visualized, but no pericecal inflammatory process seen. Diverticulosis of descending and sigmoid colon with wall thickening and pericolic inflammatory changes at distal descending colon consistent with acute diverticulitis. No evidence of extraluminal gas or abscess to suggest perforation. Stomach is under distended, unable to accurately assess wall thickness. Small hiatal hernia. Focal gas and fluid collection identified posterior to the upper stomach, suspect gastric diverticulum, unchanged. Small bowel loops unremarkable. Vascular/Lymphatic: Extensive atherosclerotic calcifications aorta iliac arteries. Aorta normal caliber. Scattered pelvic phleboliths. No adenopathy. Reproductive: Uterus surgically absent. Nonvisualization of ovaries. Other: No free air or free fluid.  No additional hernia. Musculoskeletal: Marked osseous demineralization. Chronic compression fracture and vertebroplasty of L2. IMPRESSION: Acute diverticulitis of the distal descending colon without evidence of perforation or abscess. Small  hiatal hernia and probable small posterior diverticulum of the upper stomach. Tiny nonobstructing RIGHT renal calculus. 10 x 6 mm exophytic nodule at upper to mid LEFT kidney posteriorly, indeterminate in character and slightly increased in size since 2018; follow-up non emergent characterization by MR imaging with and without contrast recommended. Chronic L2 compression fracture and prior vertebroplasty. Aortic Atherosclerosis (ICD10-I70.0). Electronically Signed   By: Lavonia Dana M.D.   On: 03/23/2020 08:50   CT HEAD WO CONTRAST  Result Date: 03/23/2020 CLINICAL DATA:  Syncope. EXAM: CT HEAD WITHOUT CONTRAST TECHNIQUE: Contiguous axial images were obtained from the base of the skull through the vertex without intravenous contrast. COMPARISON:  CT head February 10, 2018.  MRI February 11, 2018. FINDINGS: Brain: No evidence of acute large vascular territory infarction, hemorrhage, hydrocephalus, extra-axial collection or mass lesion/mass effect. Similar appearance of an old lacunar infarct versus dilated perivascular space in the right basal ganglia. Similar moderate to advanced patchy white matter matter hypoattenuation, likely related to chronic microvascular ischemic disease. Patchy areas of hypoattenuation within the basal ganglia bilaterally likely is in part secondary to dilated perivascular spaces that were better characterized on prior MRI. Vascular: Calcific atherosclerosis. Skull: No acute fracture. Sinuses/Orbits: Scattered ethmoid air cell mucosal thickening and partially imaged right maxillary sinus mucosal thickening. No air-fluid levels. Unremarkable orbits. Other: Small bilateral mastoid effusions. IMPRESSION: 1. No evidence of acute intracranial abnormality. 2. Moderate to advanced chronic microvascular ischemic disease. Electronically Signed   By: Margaretha Sheffield MD   On: 03/23/2020 13:11     CODE STATUS:     Code Status Orders  (From admission, onward)         Start     Ordered    03/23/20 1238  Limited resuscitation (code)  Continuous       Question Answer Comment  In the event of cardiac or respiratory ARREST: Perform CPR Yes   In the event of cardiac or respiratory ARREST: Perform Intubation/Mechanical Ventilation No   In the event of cardiac or respiratory ARREST: Perform Defibrillation or Cardioversion if indicated Yes   Antiarrhythmic drugs - Any drug used to treat arrhythmias Yes   Vasoactive drug - Any drug  used to stabilize blood pressure Yes      03/23/20 1237        Code Status History    Date Active Date Inactive Code Status Order ID Comments User Context   02/10/2018 2128 02/12/2018 1622 Partial Code [id number]  Sela Hua, MD Inpatient   09/19/2016 1808 09/21/2016 1607 Full Code [id number]  Nicholes Mango, MD ED   06/18/2016 2236 06/21/2016 1652 Full Code [id number]  Hugelmeyer, Ubaldo Glassing, DO Inpatient   Advance Care Planning Activity    Advance Directive Documentation   Flowsheet Row Most Recent Value  Type of Advance Directive Living will  Pre-existing out of facility DNR order (yellow form or pink MOST form) --  "MOST" Form in Place? --       TOTAL TIME TAKING CARE OF THIS PATIENT: *35* minutes.    Fritzi Mandes M.D  [facility]    CC: Primary care physician; Barbaraann Boys, MD

## 2020-03-28 LAB — CULTURE, BLOOD (ROUTINE X 2)
Culture: NO GROWTH
Culture: NO GROWTH

## 2020-04-08 ENCOUNTER — Inpatient Hospital Stay: Payer: Medicare HMO | Attending: Hematology and Oncology

## 2020-04-08 DIAGNOSIS — D509 Iron deficiency anemia, unspecified: Secondary | ICD-10-CM

## 2020-04-08 DIAGNOSIS — E610 Copper deficiency: Secondary | ICD-10-CM | POA: Diagnosis not present

## 2020-04-08 DIAGNOSIS — D472 Monoclonal gammopathy: Secondary | ICD-10-CM | POA: Insufficient documentation

## 2020-04-08 DIAGNOSIS — E538 Deficiency of other specified B group vitamins: Secondary | ICD-10-CM

## 2020-04-08 DIAGNOSIS — D72819 Decreased white blood cell count, unspecified: Secondary | ICD-10-CM

## 2020-04-08 LAB — CBC WITH DIFFERENTIAL/PLATELET
Abs Immature Granulocytes: 0.01 10*3/uL (ref 0.00–0.07)
Basophils Absolute: 0 10*3/uL (ref 0.0–0.1)
Basophils Relative: 1 %
Eosinophils Absolute: 0.1 10*3/uL (ref 0.0–0.5)
Eosinophils Relative: 5 %
HCT: 34.9 % — ABNORMAL LOW (ref 36.0–46.0)
Hemoglobin: 11.8 g/dL — ABNORMAL LOW (ref 12.0–15.0)
Immature Granulocytes: 0 %
Lymphocytes Relative: 33 %
Lymphs Abs: 1 10*3/uL (ref 0.7–4.0)
MCH: 32.2 pg (ref 26.0–34.0)
MCHC: 33.8 g/dL (ref 30.0–36.0)
MCV: 95.4 fL (ref 80.0–100.0)
Monocytes Absolute: 0.3 10*3/uL (ref 0.1–1.0)
Monocytes Relative: 10 %
Neutro Abs: 1.5 10*3/uL — ABNORMAL LOW (ref 1.7–7.7)
Neutrophils Relative %: 51 %
Platelets: 189 10*3/uL (ref 150–400)
RBC: 3.66 MIL/uL — ABNORMAL LOW (ref 3.87–5.11)
RDW: 14.2 % (ref 11.5–15.5)
WBC: 3 10*3/uL — ABNORMAL LOW (ref 4.0–10.5)
nRBC: 0 % (ref 0.0–0.2)

## 2020-04-10 LAB — COPPER, SERUM: Copper: 71 ug/dL — ABNORMAL LOW (ref 80–158)

## 2020-04-18 ENCOUNTER — Encounter: Payer: Self-pay | Admitting: Emergency Medicine

## 2020-04-18 ENCOUNTER — Emergency Department: Payer: Medicare HMO

## 2020-04-18 ENCOUNTER — Emergency Department
Admission: EM | Admit: 2020-04-18 | Discharge: 2020-04-19 | Disposition: A | Discharge status: Home / Self Care | Payer: Medicare HMO | Attending: Emergency Medicine | Admitting: Emergency Medicine

## 2020-04-18 DIAGNOSIS — Z79899 Other long term (current) drug therapy: Secondary | ICD-10-CM | POA: Insufficient documentation

## 2020-04-18 DIAGNOSIS — Z8719 Personal history of other diseases of the digestive system: Secondary | ICD-10-CM | POA: Diagnosis not present

## 2020-04-18 DIAGNOSIS — Z7982 Long term (current) use of aspirin: Secondary | ICD-10-CM | POA: Insufficient documentation

## 2020-04-18 DIAGNOSIS — I10 Essential (primary) hypertension: Secondary | ICD-10-CM | POA: Insufficient documentation

## 2020-04-18 DIAGNOSIS — R1032 Left lower quadrant pain: Secondary | ICD-10-CM

## 2020-04-18 DIAGNOSIS — K5900 Constipation, unspecified: Secondary | ICD-10-CM | POA: Insufficient documentation

## 2020-04-18 LAB — CBC
HCT: 36.2 % (ref 36.0–46.0)
Hemoglobin: 12.5 g/dL (ref 12.0–15.0)
MCH: 32.5 pg (ref 26.0–34.0)
MCHC: 34.5 g/dL (ref 30.0–36.0)
MCV: 94 fL (ref 80.0–100.0)
Platelets: 159 10*3/uL (ref 150–400)
RBC: 3.85 MIL/uL — ABNORMAL LOW (ref 3.87–5.11)
RDW: 13.7 % (ref 11.5–15.5)
WBC: 4.2 10*3/uL (ref 4.0–10.5)
nRBC: 0 % (ref 0.0–0.2)

## 2020-04-18 LAB — COMPREHENSIVE METABOLIC PANEL
ALT: 21 U/L (ref 0–44)
AST: 29 U/L (ref 15–41)
Albumin: 3.3 g/dL — ABNORMAL LOW (ref 3.5–5.0)
Alkaline Phosphatase: 37 U/L — ABNORMAL LOW (ref 38–126)
Anion gap: 13 (ref 5–15)
BUN: 14 mg/dL (ref 8–23)
CO2: 29 mmol/L (ref 22–32)
Calcium: 9.7 mg/dL (ref 8.9–10.3)
Chloride: 92 mmol/L — ABNORMAL LOW (ref 98–111)
Creatinine, Ser: 1 mg/dL (ref 0.44–1.00)
GFR, Estimated: 55 mL/min — ABNORMAL LOW (ref >60)
Glucose, Bld: 106 mg/dL — ABNORMAL HIGH (ref 70–99)
Potassium: 3.2 mmol/L — ABNORMAL LOW (ref 3.5–5.1)
Sodium: 134 mmol/L — ABNORMAL LOW (ref 135–145)
Total Bilirubin: 0.8 mg/dL (ref 0.3–1.2)
Total Protein: 8.1 g/dL (ref 6.5–8.1)

## 2020-04-18 LAB — LIPASE, BLOOD: Lipase: 51 U/L (ref 11–51)

## 2020-04-18 MED ORDER — ONDANSETRON HCL 4 MG/2ML IJ SOLN
4.0000 mg | Freq: Once | INTRAMUSCULAR | Status: AC | PRN
  Administered 2020-04-18: 4 mg via INTRAVENOUS
  Filled 2020-04-18: qty 2

## 2020-04-18 NOTE — ED Provider Notes (Incomplete)
[facility] Emergency Department Provider Note   ____________________________________________   Event Date/Time   First MD Initiated Contact with Patient 04/18/20 2358     (approximate)  I have reviewed the triage vital signs and the nursing notes.   HISTORY  Chief Complaint Abdominal Pain and Nausea    HPI Janet Duffy is a 87 y.o. female ***        {**SYMPTOM/COMPLAINT  LOCATION (describe anatomically) DURATION (when did it start) TIMING (onset and pattern) SEVERITY (0-10, mild/moderate/severe) QUALITY (description of symptoms) CONTEXT (recent surgery, new meds, activity, etc.) MODIFYINGFACTORS (what makes it better/worse) ASSOCIATEDSYMPTOMS (pertinent positives and negatives)**} Past Medical History:  Diagnosis Date  . Connective tissue disease ([place])   . DDD (degenerative disc disease), lumbar   . Diverticulitis   . Granuloma annulare   . HTN (hypertension)   . Osteoporosis     Patient Active Problem List   Diagnosis Date Noted  . Hyponatremia 03/23/2020  . Hypokalemia 03/23/2020  . Nodule of kidney 03/23/2020  . Syncope 03/23/2020  . Copper deficiency 05/26/2019  . Pernicious anemia 02/21/2019  . B12 deficiency 11/09/2018  . Leukopenia 10/26/2018  . Iron deficiency anemia 10/26/2018  . IgA monoclonal gammopathy 10/26/2018  . Left arm weakness 02/10/2018  . Acute diverticulitis 06/18/2016    Past Surgical History:  Procedure Laterality Date  . ABDOMINAL HYSTERECTOMY    . BACK SURGERY      Prior to Admission medications   Medication Sig Start Date End Date Taking? Authorizing Provider  acetaminophen (TYLENOL) 325 MG tablet Take 2 tablets (650 mg total) by mouth every 6 (six) hours as needed for mild pain (or Fever >/= 101). 09/21/16   Dustin Flock, MD  aspirin EC 81 MG EC tablet Take 1 tablet (81 mg total) by mouth daily. 02/13/18   Epifanio Lesches, MD  carvedilol (COREG) 25 MG tablet Take 25 mg by mouth 2 (two)  times daily. 05/18/16   [provider]  chlorthalidone (HYGROTON) 25 MG tablet Take 25 mg by mouth daily as needed.    [provider]  Cholecalciferol (VITAMIN D3) 1000 units CAPS Take 1,000 Units by mouth daily.    [provider]  ferrous sulfate 325 (65 FE) MG tablet Take 325 mg by mouth daily with breakfast.    [provider]  hydrALAZINE (APRESOLINE) 50 MG tablet Take 1 tablet (50 mg total) by mouth 2 (two) times daily. Patient taking differently: Take 50 mg by mouth See admin instructions. Take 1 tablet (50mg ) by mouth twice daily - may take 1 tablet (50mg ) at lunchtime if needed 02/12/18   Epifanio Lesches, MD  lisinopril (PRINIVIL,ZESTRIL) 40 MG tablet Take 1 tablet (40 mg total) by mouth daily. 02/13/18   Epifanio Lesches, MD  vitamin B-12 (CYANOCOBALAMIN) 1000 MCG tablet Take 1,000 mcg by mouth daily.    [provider]  Wheat Dextrin (BENEFIBER) POWD Take 1 Dose by mouth daily.    [provider]    Allergies Clindamycin/lincomycin, Codeine, Erythromycin, Ivp dye [iodinated diagnostic agents], Metrizamide, Monosodium glutamate, Moxifloxacin, Penicillins, Tramadol, Amlodipine, Avelox [moxifloxacin hcl in nacl], Shellfish allergy, and Zoster vaccine live  Family History  Problem Relation Age of Onset  . Ovarian cancer Mother   . Breast cancer Sister   . Leukemia Brother   . Colon cancer Brother     Social History Social History   Tobacco Use  . Smoking status: Never Smoker  . Smokeless tobacco: Never Used  Vaping Use  . Vaping  Use: Never used  Substance Use Topics  . Alcohol use: No  . Drug use: No    Review of Systems {** Revise as appropriate then delete this line - Documentation of 10 systems is required  **} Constitutional: No fever/chills Eyes: No visual changes. ENT: No sore throat. Cardiovascular: Denies chest pain. Respiratory: Denies shortness of breath. Gastrointestinal: No abdominal pain.  No  nausea, no vomiting.  No diarrhea.  No constipation. Genitourinary: Negative for dysuria. Musculoskeletal: Negative for back pain. Skin: Negative for rash. Neurological: Negative for headaches, focal weakness or numbness. {**Psychiatric:  Endocrine:  Hematological/Lymphatic:  Allergic/Immunilogical: **}  ____________________________________________   PHYSICAL EXAM:  VITAL SIGNS: ED Triage Vitals [04/18/20 2056]  Enc Vitals Group     BP (!) 145/95     Pulse Rate 81     Resp 17     Temp 98.1 F (36.7 C)     Temp Source Oral     SpO2 99 %     Weight      Height      Head Circumference      Peak Flow      Pain Score      Pain Loc      Pain Edu?      Excl. in [place]?    {** Revise as appropriate then delete this line - 8 systems required **} Constitutional: Alert and oriented. Well appearing and in no acute distress. Eyes: Conjunctivae are normal. PERRL. EOMI. Head: Atraumatic. Nose: No congestion/rhinnorhea. Mouth/Throat: Mucous membranes are moist.  Oropharynx non-erythematous. Neck: No stridor.  {**No cervical spine tenderness to palpation.**} {**Hematological/Lymphatic/Immunilogical: No cervical lymphadenopathy. **}Cardiovascular: Normal rate, regular rhythm. Grossly normal heart sounds.  Good peripheral circulation. Respiratory: Normal respiratory effort.  No retractions. Lungs CTAB. Gastrointestinal: Soft and nontender. No distention. No abdominal bruits. No CVA tenderness. {**Genitourinary:  **}Musculoskeletal: No lower extremity tenderness nor edema.  No joint effusions. Neurologic:  Normal speech and language. No gross focal neurologic deficits are appreciated. No gait instability. Skin:  Skin is warm, dry and intact. No rash noted. Psychiatric: Mood and affect are normal. Speech and behavior are normal.  ____________________________________________   LABS (all labs ordered are listed, but only abnormal results are displayed)  Labs Reviewed  COMPREHENSIVE  METABOLIC PANEL - Abnormal; Notable for the following components:      Result Value   Sodium 134 (*)    Potassium 3.2 (*)    Chloride 92 (*)    Glucose, Bld 106 (*)    Albumin 3.3 (*)    Alkaline Phosphatase 37 (*)    GFR, Estimated 55 (*)    All other components within normal limits  CBC - Abnormal; Notable for the following components:   RBC 3.85 (*)    All other components within normal limits  LIPASE, BLOOD  URINALYSIS, COMPLETE (UACMP) WITH MICROSCOPIC  TROPONIN I (HIGH SENSITIVITY)   ____________________________________________  EKG  *** ____________________________________________  RADIOLOGY I, Brannon Decaire J, personally viewed and evaluated these images (plain radiographs) as part of my medical decision making, as well as reviewing the written report by the radiologist.  ED MD interpretation:  ***  Official radiology report(s): No results found.  ____________________________________________   PROCEDURES  Procedure(s) performed (including Critical Care):  Procedures   ____________________________________________   INITIAL IMPRESSION / ASSESSMENT AND PLAN / ED COURSE  As part of my medical decision making, I reviewed the following data within the electronic MEDICAL RECORD NUMBER {[id number]::"Notes from prior ED visits","[place] Controlled Substance Database"}        ***  ____________________________________________   FINAL CLINICAL IMPRESSION(S) / ED DIAGNOSES  Final diagnoses:  Left lower quadrant abdominal pain     ED Discharge Orders    None      *Please note:  Aprilann Trama was evaluated in Emergency Department on 04/18/2020 for the symptoms described in the history of present illness. She was evaluated in the context of the global COVID-19 pandemic, which necessitated consideration that the patient might be at risk for infection with the SARS-CoV-2 virus that causes COVID-19. Institutional protocols and algorithms that pertain to the evaluation  of patients at risk for COVID-19 are in a state of rapid change based on information released by regulatory bodies including the CDC and federal and state organizations. These policies and algorithms were followed during the patient's care in the ED.  Some ED evaluations and interventions may be delayed as a result of limited staffing during and the pandemic.*   Note:  This document was prepared using Dragon voice recognition software and may include unintentional dictation errors.

## 2020-04-18 NOTE — ED Triage Notes (Signed)
Pt reports she was recently diagnosed with diverticulitis and over the last 24 hours has not been able to tolerate pain and had constant nausea. Pt in triage tearful and guarding lower abdomin.

## 2020-04-18 NOTE — ED Provider Notes (Signed)
[facility] Emergency Department Provider Note   ____________________________________________   Event Date/Time   First MD Initiated Contact with Patient 04/18/20 2358     (approximate)  I have reviewed the triage vital signs and the nursing notes.   HISTORY  Chief Complaint Abdominal Pain and Nausea    HPI Janet Duffy is a 87 y.o. female who presents to the ED from home with a chief complaint of abdominal pain.  Patient has a history of diverticulitis, was hospitalized 12/20-12/21/2021 for dehydration and diverticulitis treated with IV Rocephin and Flagyl in the hospital and converted to Cefdinir and Flagyl x5 days upon discharge.  She was doing well until 04/13/2020 when the pain returned in her left lower quadrant. Pain associated with nausea only. Patient saw her PCP on 04/16/2020 and was prescribed a 10 day course of Cipro/Flagyl.  Presents tonight for constant pain and nausea.  Denies fever, chills, cough, chest pain, shortness of breath, vomiting, dysuria or diarrhea.  Notes constipation; having small BMs daily.      Past Medical History:  Diagnosis Date  . Connective tissue disease ([place])   . DDD (degenerative disc disease), lumbar   . Diverticulitis   . Granuloma annulare   . HTN (hypertension)   . Osteoporosis     Patient Active Problem List   Diagnosis Date Noted  . Hyponatremia 03/23/2020  . Hypokalemia 03/23/2020  . Nodule of kidney 03/23/2020  . Syncope 03/23/2020  . Copper deficiency 05/26/2019  . Pernicious anemia 02/21/2019  . B12 deficiency 11/09/2018  . Leukopenia 10/26/2018  . Iron deficiency anemia 10/26/2018  . IgA monoclonal gammopathy 10/26/2018  . Left arm weakness 02/10/2018  . Acute diverticulitis 06/18/2016    Past Surgical History:  Procedure Laterality Date  . ABDOMINAL HYSTERECTOMY    . BACK SURGERY      Prior to Admission medications   Medication Sig Start Date End Date Taking? Authorizing Provider   lactulose (CHRONULAC) 10 GM/15ML solution Take 30 mLs (20 g total) by mouth daily as needed for mild constipation. 04/19/20  Yes Paulette Blanch, MD  ondansetron (ZOFRAN ODT) 4 MG disintegrating tablet Take 1 tablet (4 mg total) by mouth every 8 (eight) hours as needed for nausea or vomiting. 04/19/20  Yes Paulette Blanch, MD  acetaminophen (TYLENOL) 325 MG tablet Take 2 tablets (650 mg total) by mouth every 6 (six) hours as needed for mild pain (or Fever >/= 101). 09/21/16   Dustin Flock, MD  aspirin EC 81 MG EC tablet Take 1 tablet (81 mg total) by mouth daily. 02/13/18   Epifanio Lesches, MD  carvedilol (COREG) 25 MG tablet Take 25 mg by mouth 2 (two) times daily. 05/18/16   [provider]  chlorthalidone (HYGROTON) 25 MG tablet Take 25 mg by mouth daily as needed.    [provider]  Cholecalciferol (VITAMIN D3) 1000 units CAPS Take 1,000 Units by mouth daily.    [provider]  ferrous sulfate 325 (65 FE) MG tablet Take 325 mg by mouth daily with breakfast.    [provider]  hydrALAZINE (APRESOLINE) 50 MG tablet Take 1 tablet (50 mg total) by mouth 2 (two) times daily. Patient taking differently: Take 50 mg by mouth See admin instructions. Take 1 tablet (50mg ) by mouth twice daily - may take 1 tablet (50mg ) at lunchtime if needed 02/12/18   Epifanio Lesches, MD  lisinopril (PRINIVIL,ZESTRIL) 40 MG tablet Take 1 tablet (40 mg total) by mouth daily. 02/13/18  Epifanio Lesches, MD  vitamin B-12 (CYANOCOBALAMIN) 1000 MCG tablet Take 1,000 mcg by mouth daily.    [provider]  Wheat Dextrin (BENEFIBER) POWD Take 1 Dose by mouth daily.    [provider]    Allergies Clindamycin/lincomycin, Codeine, Erythromycin, Ivp dye [iodinated diagnostic agents], Metrizamide, Monosodium glutamate, Moxifloxacin, Penicillins, Tramadol, Amlodipine, Avelox [moxifloxacin hcl in nacl], Shellfish allergy, and Zoster vaccine live  Family History   Problem Relation Age of Onset  . Ovarian cancer Mother   . Breast cancer Sister   . Leukemia Brother   . Colon cancer Brother     Social History Social History   Tobacco Use  . Smoking status: Never Smoker  . Smokeless tobacco: Never Used  Vaping Use  . Vaping Use: Never used  Substance Use Topics  . Alcohol use: No  . Drug use: No    Review of Systems  Constitutional: No fever/chills Eyes: No visual changes. ENT: No sore throat. Cardiovascular: Denies chest pain. Respiratory: Denies shortness of breath. Gastrointestinal: Positive for left lower quadrant abdominal pain and nausea, no vomiting.  No diarrhea.  No constipation. Genitourinary: Negative for dysuria. Musculoskeletal: Negative for back pain. Skin: Negative for rash. Neurological: Negative for headaches, focal weakness or numbness.   ____________________________________________   PHYSICAL EXAM:  VITAL SIGNS: ED Triage Vitals [04/18/20 2056]  Enc Vitals Group     BP (!) 145/95     Pulse Rate 81     Resp 17     Temp 98.1 F (36.7 C)     Temp Source Oral     SpO2 99 %     Weight      Height      Head Circumference      Peak Flow      Pain Score      Pain Loc      Pain Edu?      Excl. in [place]?     Constitutional: Alert and oriented.  Elderly appearing and in mild acute distress. Eyes: Conjunctivae are normal. PERRL. EOMI. Head: Atraumatic. Nose: No congestion/rhinnorhea. Mouth/Throat: Mucous membranes are mildly dry.   Neck: No stridor.   Cardiovascular: Normal rate, regular rhythm. Grossly normal heart sounds.  Good peripheral circulation. Respiratory: Normal respiratory effort.  No retractions. Lungs CTAB. Gastrointestinal: Soft and mildly tender to palpation left lower quadrant without rebound or guarding. No distention. No abdominal bruits. No CVA tenderness. Musculoskeletal: No lower extremity tenderness nor edema.  No joint effusions. Neurologic:  Normal speech and language. No gross  focal neurologic deficits are appreciated. No gait instability. Skin:  Skin is warm, dry and intact. No rash noted. Psychiatric: Mood and affect are normal. Speech and behavior are normal.  ____________________________________________   LABS (all labs ordered are listed, but only abnormal results are displayed)  Labs Reviewed  COMPREHENSIVE METABOLIC PANEL - Abnormal; Notable for the following components:      Result Value   Sodium 134 (*)    Potassium 3.2 (*)    Chloride 92 (*)    Glucose, Bld 106 (*)    Albumin 3.3 (*)    Alkaline Phosphatase 37 (*)    GFR, Estimated 55 (*)    All other components within normal limits  CBC - Abnormal; Notable for the following components:   RBC 3.85 (*)    All other components within normal limits  URINE CULTURE  LIPASE, BLOOD  URINALYSIS, COMPLETE (UACMP) WITH MICROSCOPIC  TROPONIN I (HIGH SENSITIVITY)   ____________________________________________  EKG  None ____________________________________________  RADIOLOGY Larey Seat, personally viewed and evaluated these images (plain radiographs) as part of my medical decision making, as well as reviewing the written report by the radiologist.  ED MD interpretation: No acute abnormality; large amount of colonic stool  Official radiology report(s): CT Abdomen Pelvis Wo Contrast  Result Date: 04/19/2020 CLINICAL DATA:  Abdominal pain EXAM: CT ABDOMEN AND PELVIS WITHOUT CONTRAST TECHNIQUE: Multidetector CT imaging of the abdomen and pelvis was performed following the standard protocol without IV contrast. COMPARISON:  03/23/2020 FINDINGS: LOWER CHEST: No acute finding HEPATOBILIARY: Normal hepatic contours. No intra- or extrahepatic biliary dilatation. Normal gallbladder. PANCREAS: Normal pancreas. No ductal dilatation or peripancreatic fluid collection. SPLEEN: Normal. ADRENALS/URINARY TRACT: The adrenal glands are normal. No hydronephrosis, nephroureterolithiasis or solid renal mass. The  urinary bladder is normal for degree of distention STOMACH/BOWEL: There is no hiatal hernia. Normal duodenal course and caliber. No small bowel dilatation or inflammation. Large amount of colonic stool no evidence of active inflammation. Appendix not clearly seen. VASCULAR/LYMPHATIC: There is calcific atherosclerosis of the abdominal aorta. No lymphadenopathy. REPRODUCTIVE: Status post hysterectomy. No adnexal mass. MUSCULOSKELETAL. No bony spinal canal stenosis or focal osseous abnormality. OTHER: L2 vertebral augmentation. IMPRESSION: 1. No acute abnormality of the abdomen or pelvis. 2. Large amount of colonic stool. Aortic Atherosclerosis (ICD10-I70.0). Electronically Signed   By: Ulyses Jarred M.D.   On: 04/19/2020 00:46    ____________________________________________   PROCEDURES  Procedure(s) performed (including Critical Care):  Procedures   ____________________________________________   INITIAL IMPRESSION / ASSESSMENT AND PLAN / ED COURSE  As part of my medical decision making, I reviewed the following data within the [facility] notes reviewed and incorporated, Labs reviewed, Old chart reviewed, Radiograph reviewed, Notes from prior ED visits and [place] Controlled Substance Database     87 year old female with recurrent diverticulitis presenting with left lower quadrant abdominal pain and nausea; currently on Cipro and Flagyl. Differential diagnosis includes, but is not limited to, ovarian cyst, ovarian torsion, acute appendicitis, diverticulitis, urinary tract infection/pyelonephritis, endometriosis, bowel obstruction, colitis, renal colic, gastroenteritis, hernia, fibroids, endometriosis, etc.  Laboratory results remarkable for mild hypokalemia.  Will obtain noncontrast CT scan (patient has IV dye anaphylaxis) to evaluate for perforation.  Will initiate IV fluid hydration, IV analgesia, IV antiemetic and reassess.   Clinical Course as of 04/19/20 0431  Nancy Fetter  Apr 19, 2020  0231 Technical issues with laboratory running urinalysis. Patient feeling better and eager to be discharged home. I discussed with patient and family member based on CT scan, there is no utility for antibiotics. Patient is fine holding Cipro/Flagyl because she feels like they are causing her nausea. Plus I do not want patient unnecessarily on antibiotics as she would be a great candidate to develop C. difficile. Will place her on Lactulose as needed for bowel movements. We will also discharge home with prescription for Zofran. Strict return precautions given. Patient and family member verbalized understanding and agree with plan of care. [JS]    Clinical Course User Index [JS] Paulette Blanch, MD     ____________________________________________   FINAL CLINICAL IMPRESSION(S) / ED DIAGNOSES  Final diagnoses:  Left lower quadrant abdominal pain  Constipation, unspecified constipation type     ED Discharge Orders         Ordered    lactulose (CHRONULAC) 10 GM/15ML solution  Daily PRN        04/19/20 0234    ondansetron (ZOFRAN ODT) 4 MG disintegrating tablet  Every 8 hours PRN        04/19/20 0234          *Please note:  Janet Duffy was evaluated in Emergency Department on 04/19/2020 for the symptoms described in the history of present illness. She was evaluated in the context of the global COVID-19 pandemic, which necessitated consideration that the patient might be at risk for infection with the SARS-CoV-2 virus that causes COVID-19. Institutional protocols and algorithms that pertain to the evaluation of patients at risk for COVID-19 are in a state of rapid change based on information released by regulatory bodies including the CDC and federal and state organizations. These policies and algorithms were followed during the patient's care in the ED.  Some ED evaluations and interventions may be delayed as a result of limited staffing during and the pandemic.*   Note:  This  document was prepared using Dragon voice recognition software and may include unintentional dictation errors.   Paulette Blanch, MD 04/19/20 [phone]

## 2020-04-19 LAB — URINALYSIS, COMPLETE (UACMP) WITH MICROSCOPIC
Bacteria, UA: NONE SEEN
Bilirubin Urine: NEGATIVE
Glucose, UA: NEGATIVE mg/dL
Hgb urine dipstick: NEGATIVE
Ketones, ur: NEGATIVE mg/dL
Leukocytes,Ua: NEGATIVE
Nitrite: NEGATIVE
Protein, ur: NEGATIVE mg/dL
Specific Gravity, Urine: 1.003 — ABNORMAL LOW (ref 1.005–1.030)
Squamous Epithelial / HPF: NONE SEEN (ref 0–5)
pH: 7 (ref 5.0–8.0)

## 2020-04-19 LAB — TROPONIN I (HIGH SENSITIVITY): Troponin I (High Sensitivity): 3 ng/L (ref <18)

## 2020-04-19 MED ORDER — FENTANYL CITRATE (PF) 100 MCG/2ML IJ SOLN
25.0000 ug | Freq: Once | INTRAMUSCULAR | Status: AC
Start: 2020-04-19 — End: 2020-04-19
  Administered 2020-04-19: 25 ug via INTRAVENOUS
  Filled 2020-04-19: qty 2

## 2020-04-19 MED ORDER — LACTULOSE 10 GM/15ML PO SOLN
30.0000 g | Freq: Once | ORAL | Status: AC
  Administered 2020-04-19: 30 g via ORAL
  Filled 2020-04-19: qty 60

## 2020-04-19 MED ORDER — ONDANSETRON 4 MG PO TBDP: 4.0000 mg | ORAL_TABLET | Freq: Three times a day (TID) | ORAL | 0 refills | Status: DC | PRN

## 2020-04-19 MED ORDER — LACTULOSE 10 GM/15ML PO SOLN: 20.0000 g | Freq: Every day | ORAL | 0 refills | Status: DC | PRN

## 2020-04-19 MED ORDER — SODIUM CHLORIDE 0.9 % IV BOLUS
1000.0000 mL | Freq: Once | INTRAVENOUS | Status: AC
  Administered 2020-04-19: 1000 mL via INTRAVENOUS

## 2020-04-19 MED ORDER — ONDANSETRON HCL 4 MG/2ML IJ SOLN
4.0000 mg | Freq: Once | INTRAMUSCULAR | Status: AC
  Administered 2020-04-19: 4 mg via INTRAVENOUS
  Filled 2020-04-19: qty 2

## 2020-04-19 NOTE — Discharge Instructions (Addendum)
1. You may discontinue your antibiotics. 2. You may take Lactulose as needed for bowel movements, Zofran as needed for nausea. 3. Drink plenty of fluids daily. 4. Return to the ER for worsening symptoms, persistent vomiting, difficulty breathing or other concerns.

## 2020-04-21 LAB — URINE CULTURE: Culture: NO GROWTH

## 2020-06-08 ENCOUNTER — Other Ambulatory Visit: Payer: Self-pay

## 2020-06-08 DIAGNOSIS — D472 Monoclonal gammopathy: Secondary | ICD-10-CM

## 2020-06-09 NOTE — Progress Notes (Signed)
[facility]  [address] [place], [place] [address] Phone: [phone]  Fax: [phone]   Clinic Day:  06/10/2020  Referring physician: Barbaraann Boys, MD  Chief Complaint: Janet Duffy is a 87 y.o. female with iron deficiency anemia, leukopenia, and an IgA monoclonal gammopathy who is seen for 3 month assessment.   HPI: The patient was last seen in the hematology clinic on 03/11/2020. At that time, she had been "making it." Her energy was "partly sunny." She had shoulder pain and upper back pain if she stood for too long. If she sat down for a few minutes, she felt better. If she laid down on her left hip, she had pain and her toes felt numb (no change). She denied any concerns with infections or diarrhea. Her Raynaud's was stable. Exam is stable. Hematocrit was 35.5, hemoglobin 11.7, MCV 96.5, platelets 170,000, WBC 2,900. Potassium was 3.4. M spike was 2.4 gm/dL. She continued oral iron and vitamin B12. She was to restart copper supplementation.  Bone survey on 03/11/2020 revealed no suspicious focal lytic lesion or acute bony abnormality.  The patient was admitted to [facility] from 03/23/2020 - 03/24/2020 with abdominal pain. CT showed showed distal colon diverticulitis without evidence of perforation or abscess formation. She was treated with Rocephin and Flagyl which was converted to Cefdinir and Flagyl x 5 days upon discharge. She was given IV fluids for hyponatremia.  The patient was seen in the [facility] ER on 04/18/2020 for abdominal pain and nausea. Her PCP had prescribed her a 10 day course of cipro and Flagyl a couple of days prior. CT showed no acute abnormalities. She was instructed to hold her antibiotics as she felt that they were causing her nausea. She was discharged with Zofran.  Labs on 04/08/2020 revealed a hematocrit of 34.9, hemoglobin 11.8, MCV 95.4, platelets 189,000, WBC 3,000. Copper was 71 (80-158).   During the interim, she has been  good. Her energy is "partly sunny." She will have 2-3 good days in a row, then one day of low energy and lack of motivation. She has occasional headaches and constipation. She is eating well and denies nausea, vomiting, and diarrhea.  The patient still has shoulder and upper back pain if she stands for too long. If she stops and rests, she feels better. Her sciatica is stable.  She takes a multivitamin and a copper supplement. She is not sure of the dose.   Past Medical History:  Diagnosis Date  . Connective tissue disease ([place])   . DDD (degenerative disc disease), lumbar   . Diverticulitis   . Granuloma annulare   . HTN (hypertension)   . Osteoporosis     Past Surgical History:  Procedure Laterality Date  . ABDOMINAL HYSTERECTOMY    . BACK SURGERY      Family History  Problem Relation Age of Onset  . Ovarian cancer Mother   . Breast cancer Sister   . Leukemia Brother   . Colon cancer Brother     Social History:  reports that she has never smoked. She has never used smokeless tobacco. She reports that she does not drink alcohol and does not use drugs. She lives in [place]. The patient is alone today.  Allergies:  Allergies  Allergen Reactions  . Clindamycin/Lincomycin Anaphylaxis  . Codeine Nausea Only  . Erythromycin Anaphylaxis  . Ivp Dye [Iodinated Diagnostic Agents] Anaphylaxis  . Metrizamide Anaphylaxis  . Monosodium Glutamate Shortness Of Breath and Swelling    Tongue  swelling  . Moxifloxacin Anaphylaxis  . Penicillins Anaphylaxis and Rash    .Has patient had a PCN reaction causing immediate rash, facial/tongue/throat swelling, SOB or lightheadedness with hypotension: Yes Has patient had a PCN reaction causing severe rash involving mucus membranes or skin necrosis: No Has patient had a PCN reaction that required hospitalization: Unknown Has patient had a PCN reaction occurring within the last 10 years: Yes If all of the above answers are "NO", then may  proceed with Cephalosporin use.   . Tramadol     Patient discloses she "went crazy"  . Amlodipine Swelling  . Avelox [Moxifloxacin Hcl In Nacl]   . Shellfish Allergy Hives    seafood  . Zoster Vaccine Live Rash    Current Medications: Current Outpatient Medications  Medication Sig Dispense Refill  . acetaminophen (TYLENOL) 325 MG tablet Take 2 tablets (650 mg total) by mouth every 6 (six) hours as needed for mild pain (or Fever >/= 101).    Marland Kitchen aspirin EC 81 MG EC tablet Take 1 tablet (81 mg total) by mouth daily. 30 tablet 0  . carvedilol (COREG) 25 MG tablet Take 25 mg by mouth 2 (two) times daily.    . chlorthalidone (HYGROTON) 25 MG tablet Take 25 mg by mouth daily as needed.    . Cholecalciferol (VITAMIN D3) 1000 units CAPS Take 1,000 Units by mouth daily.    . ferrous sulfate 325 (65 FE) MG tablet Take 325 mg by mouth daily with breakfast.    . hydrALAZINE (APRESOLINE) 50 MG tablet Take 1 tablet (50 mg total) by mouth 2 (two) times daily. (Patient taking differently: Take 50 mg by mouth See admin instructions. Take 1 tablet (44m) by mouth twice daily - may take 1 tablet (543m at lunchtime if needed) 30 tablet 0  . lactulose (CHRONULAC) 10 GM/15ML solution Take 30 mLs (20 g total) by mouth daily as needed for mild constipation. 120 mL 0  . lisinopril (PRINIVIL,ZESTRIL) 40 MG tablet Take 1 tablet (40 mg total) by mouth daily. 30 tablet 0  . vitamin B-12 (CYANOCOBALAMIN) 1000 MCG tablet Take 1,000 mcg by mouth daily.    . Wheat Dextrin (BENEFIBER) POWD Take 1 Dose by mouth daily.    . ondansetron (ZOFRAN ODT) 4 MG disintegrating tablet Take 1 tablet (4 mg total) by mouth every 8 (eight) hours as needed for nausea or vomiting. (Patient not taking: Reported on 06/10/2020) 20 tablet 0   No current facility-administered medications for this visit.    Review of Systems  Constitutional: Positive for weight loss (2 lbs, fluctuates). Negative for chills, diaphoresis, fever and  malaise/fatigue.       Feels good. Energy is "partly sunny."  HENT: Negative.  Negative for congestion, ear discharge, ear pain, hearing loss, nosebleeds, sinus pain, sore throat and tinnitus.   Eyes: Negative.  Negative for blurred vision.  Respiratory: Negative.  Negative for cough, hemoptysis, sputum production and shortness of breath.   Cardiovascular: Negative.  Negative for chest pain, palpitations and leg swelling.  Gastrointestinal: Positive for constipation (occasional). Negative for abdominal pain, blood in stool, diarrhea, heartburn, melena, nausea and vomiting.       Eating well.  Genitourinary: Negative.  Negative for dysuria, frequency, hematuria and urgency.  Musculoskeletal: Positive for joint pain (shoulder pain, arthritis in fingers). Negative for back pain, myalgias and neck pain.  Skin: Negative.  Negative for itching and rash.  Neurological: Positive for headaches (occasional). Negative for dizziness, tingling, sensory change and weakness.  Sciatica. Toe tingling and hip pain if she lays on left hip  Endo/Heme/Allergies: Negative.  Does not bruise/bleed easily.       Raynaud's.  Psychiatric/Behavioral: Negative for depression, memory loss and substance abuse. The patient is not nervous/anxious and does not have insomnia.   All other systems reviewed and are negative.  Performance status (ECOG): 1  Vitals Blood pressure (!) 182/88, pulse (!) 56, temperature 97.8 F (36.6 C), temperature source Oral, weight 120 lb 2.4 oz (54.5 kg), SpO2 100 %.   Physical Exam Vitals and nursing note reviewed.  Constitutional:      General: She is not in acute distress.    Appearance: She is well-developed. She is not diaphoretic.  HENT:     Head: Normocephalic and atraumatic.     Comments: Short gray hair.    Mouth/Throat:     Pharynx: No oropharyngeal exudate.  Eyes:     General: No scleral icterus.    Conjunctiva/sclera: Conjunctivae normal.     Pupils: Pupils are equal,  round, and reactive to light.     Comments: Blue eyes s/p cataract surgery.  Neck:     Vascular: No JVD.  Cardiovascular:     Rate and Rhythm: Normal rate and regular rhythm.     Heart sounds: Normal heart sounds. No murmur heard.   Pulmonary:     Effort: Pulmonary effort is normal. No respiratory distress.     Breath sounds: Normal breath sounds. No wheezing or rales.  Chest:  Breasts:     Right: No axillary adenopathy or supraclavicular adenopathy.     Left: No axillary adenopathy or supraclavicular adenopathy.    Abdominal:     General: Bowel sounds are normal. There is no distension.     Palpations: Abdomen is soft. There is no hepatomegaly, splenomegaly or mass.     Tenderness: There is no abdominal tenderness. There is no guarding or rebound.  Musculoskeletal:        General: Normal range of motion.     Cervical back: Normal range of motion and neck supple.  Lymphadenopathy:     Head:     Right side of head: No preauricular, posterior auricular or occipital adenopathy.     Left side of head: No preauricular, posterior auricular or occipital adenopathy.     Cervical: No cervical adenopathy.     Upper Body:     Right upper body: No supraclavicular or axillary adenopathy.     Left upper body: No supraclavicular or axillary adenopathy.     Lower Body: No right inguinal adenopathy. No left inguinal adenopathy.  Skin:    General: Skin is warm and dry.     Coloration: Skin is not pale.     Findings: No erythema or rash.  Neurological:     Mental Status: She is alert and oriented to person, place, and time.  Psychiatric:        Behavior: Behavior normal.        Thought Content: Thought content normal.        Judgment: Judgment normal.     Appointment on 06/10/2020  Component Date Value Ref Range Status  . Sodium 06/10/2020 136  135 - 145 mmol/L Final  . Potassium 06/10/2020 3.7  3.5 - 5.1 mmol/L Final  . Chloride 06/10/2020 97* 98 - 111 mmol/L Final  . CO2 06/10/2020  28  22 - 32 mmol/L Final  . Glucose, Bld 06/10/2020 95  70 - 99 mg/dL Final   Glucose reference  range applies only to samples taken after fasting for at least 8 hours.  . BUN 06/10/2020 12  8 - 23 mg/dL Final  . Creatinine, Ser 06/10/2020 0.75  0.44 - 1.00 mg/dL Final  . Calcium 06/10/2020 9.5  8.9 - 10.3 mg/dL Final  . Total Protein 06/10/2020 8.0  6.5 - 8.1 g/dL Final  . Albumin 06/10/2020 3.1* 3.5 - 5.0 g/dL Final  . AST 06/10/2020 19  15 - 41 U/L Final  . ALT 06/10/2020 15  0 - 44 U/L Final  . Alkaline Phosphatase 06/10/2020 40  38 - 126 U/L Final  . Total Bilirubin 06/10/2020 0.6  0.3 - 1.2 mg/dL Final  . GFR, Estimated 06/10/2020 >60  >60 mL/min Final   Comment: (NOTE) Calculated using the CKD-EPI Creatinine Equation (2021)   . Anion gap 06/10/2020 11  5 - 15 Final   Performed at [facility], [address], [place], [place] [address]  . WBC 06/10/2020 3.0* 4.0 - 10.5 K/uL Final  . RBC 06/10/2020 3.65* 3.87 - 5.11 MIL/uL Final  . Hemoglobin 06/10/2020 11.7* 12.0 - 15.0 g/dL Final  . HCT 06/10/2020 34.8* 36.0 - 46.0 % Final  . MCV 06/10/2020 95.3  80.0 - 100.0 fL Final  . MCH 06/10/2020 32.1  26.0 - 34.0 pg Final  . MCHC 06/10/2020 33.6  30.0 - 36.0 g/dL Final  . RDW 06/10/2020 14.4  11.5 - 15.5 % Final  . Platelets 06/10/2020 171  150 - 400 K/uL Final  . nRBC 06/10/2020 0.0  0.0 - 0.2 % Final  . Neutrophils Relative % 06/10/2020 58  % Final  . Neutro Abs 06/10/2020 1.8  1.7 - 7.7 K/uL Final  . Lymphocytes Relative 06/10/2020 26  % Final  . Lymphs Abs 06/10/2020 0.8  0.7 - 4.0 K/uL Final  . Monocytes Relative 06/10/2020 11  % Final  . Monocytes Absolute 06/10/2020 0.3  0.1 - 1.0 K/uL Final  . Eosinophils Relative 06/10/2020 4  % Final  . Eosinophils Absolute 06/10/2020 0.1  0.0 - 0.5 K/uL Final  . Basophils Relative 06/10/2020 1  % Final  . Basophils Absolute 06/10/2020 0.0  0.0 - 0.1 K/uL Final  . Immature Granulocytes 06/10/2020 0  % Final  . Abs Immature  Granulocytes 06/10/2020 0.00  0.00 - 0.07 K/uL Final   Performed at [facility], [address] [place], [place] [address]    Assessment:  Janet Duffy is a 87 y.o. female withiron deficiency anemia, leukopenia, and an IgA monoclonal gammopathy.WBChas ranged from3,900 - 5,600 from 2007-2020.WBC was2,700 on 09/24/2018.  She has anundifferentiated connective tissue disease. She isfollowed by Dr [facility].She has a history of digital vascular insufficiency, embolus by arteriogram, positive rheumatoid factor (1:1280), elevated TPA inhibitor, flare of symmetrical polyarthritis 2002, elevated sed rate/CRP, monoclonal IgA lambda,and episodes of morning stiffness,andpolyarthralgiasimproved with steroids  She has a history of colitis/diverticulitsx 3. She does not have ulcerative colitis or Crohn's disease. She required 1 unit of PRBCs4 years ago for GI bleed.  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 Shehas had an elevated IgAsince 2007. IgAhas been followed: 2170 on 01/23/2006, 2340 on 03/02/2006,2470 on 10/14/2015, 2491 on 10/26/2018, and 2480 on 01/29/2020. In10/16/2014,she was noted to have a biclonal gammopathy with an IgA lambda and a less intense IgG kappa monoclonal protein.  M spikehas been followed (gm/dL): 1.57 on02/09/2015,1.56on 03/02/2006,1.52 on 03/08/2007, 1.59 on 04/10/2008, 1.91 on 04/16/2009, 1.20on 07/19/2012,1.36 on 01/17/2013, 1.59 on07/08/2014,  1.65 on05/02/2016,2.15 on07/05/2018, 2.4 on 10/26/2018, 2.4 on 02/18/2019, 2.3 on 05/22/2019, 2.5 on 08/26/2019, 1.4 on 12/05/2019, and 2.4 on 03/11/2020.Kappa free light chains were 1.56, lambda free light chains 1.98,and ratio 0.79 on07/03/2016.  Work-up on 07/24/2020revealed: hematocrit 33.2, hemoglobin 10.9, MCV 92.5, platelets 172,000, WBC 4,300 with an ANC of 1900. Differential was unremarkable. Protein was8.5,  albumin 3.0, and calcium 8.9. Retic countwas0.6%. Iron saturationwas29%with aTIBC 244.B12was79(low) and folatenormal.IgA2491 (high). M-proteinwas2.4gm/dL IgA monoclonal protein with lambda light chain specificity. Kappa light chain were21.5, lambda light chain 20.8, ratio 1.03(normal).24 hour UPEPrevealed 167 mg/24 hours of protein. There was no monoclonal protein with kappa free light chains 7.70, lambda free light chains <0.65 and ratio >11.85 (1.03-31.76). Flow cytometryshowed 23% of T cells with loss of CD7 (seen in both reactive and neoplastic processes). There was nomonoclonal B cell population.  PET scanon 11/15/2018 revealed no evidence of myeloma or plasmacytoma.  Bone survey on 03/11/2020 revealed no suspicious focal lytic lesion or acute bony abnormality.  Bone marrow biopsyon 11/19/2018 revealed a hypercellular marrow for age (40%). Myeloid and erythroid elementswerepresent in normal proportions. Megakaryocytes demonstrateda spectrum of maturation without clustering. Therewereno atypical lymphoid aggregates (CD20, CD3) or granulomas. CD138 highlightedan increased plasma cell population(~10%) which were lambda restricted. A subset of the plasma cells also expressedCD56. Differential included anon-IgM monoclonal gammopathy of undetermined significance (MGUS)and plasma cell myeloma.  She has iron deficiency.Ferritinhas been followed: 38 on 04/16/2009, 11 on 05/13/2010, 3 on 10/07/2014, 8 on 09/24/2018, 21 on 02/18/2019, 22 on 05/22/2019, 09/05/2019, 35 on 01/29/2020, and 35 on 06/10/2020.  Labs on 09/24/2018:hematocrit 35.9, hemoglobin 12.0, MCV 90, platelets 152,000, WBC 2,700(ANC 1,400).Differential included 53% segs, 29% lymphs, 12% monocytes and 5% eosinophils. Creatinine 0.8.Calcium 9.3. Protein 8.8 (high)and albumin 2.9 (low).TSHwas 2.91.HIV, hepatitis B (surface antigen, core antibody total, surface antibody) and  hepatitis C were negative on07/03/2016.  Copper was 63 (low) and anti-neutrophil antibodies were negative on 02/21/2019.  She has B12 deficiency. She has pernicious anemia.  Anti-parietal cell antibodies and intrinsic factor antibodies were elevated on 11/29/2018.  B12 was 79 on 10/26/2018.She is on oral B12. B12 was 405 on 11/29/2018.  She has copper deficiency.  She has received both of her COVID-19 vaccines.  She tested positive for COVID-19 on 11/22/2019. She received a casirivimab/imdevimab infusion on 11/26/2019. She is planning to get her Booster shot. She received the flu vaccine.  Symptomatically, she feels good. She will have 2-3 good days in a row, then one day of low energy and lack of motivation. She is eating well and denies nausea, vomiting, and diarrhea.  She is on a multivitamin with copper.  Exam reveals no adenopathy or hepatosplenomegaly.  Plan: 1.   Labs today: CBC with diff, CMP, SPEP, FLCA, ferritin, iron studies, copper. 2.IgA monoclonal gammopathy with lambda light chain specificity Clinically, she is doing well except for intermittent fatigue.  Exam reveals no adenopathy or hepatosplenomegaly.   She has no CRAB criteria.   Calcium is 9.5.  Creatinine is 0.75.  Hemoglobin is 11.7.  No known bone lesions.  PET scan on 11/15/2018 revealed no lytic bone lesions. Bone marrow on 11/19/2018 revealed 10% plasma cells which were lambda restricted. M spike is an IgA monoclonal protein with lambda light chain specific specificity. 24-hour UPEP revealed no M spike but excess free lambda light chains. M-spike is 2.6 today.             Criteria for non IgM MGUS: M-spike <3 gm/dL. <10% plasma cells in the marrow. No CRAB  criteria. Criteria for asymptomatic smoldering myeloma M spike >3  gm/dL. >= 10% to <60% plasma cells in the marrow. No CRAB criteria.  She is to have smoldering myeloma.  Bone survey on 03/11/2020 revealed no suspicious focal lytic lesion or acute bony abnormality.  Continue to monitor every 3 months. 3. Iron deficiency anemia Hematocrit34.8. Hemoglobin 11.8. MCV 95.3 on 06/10/2020.   Ferritin35.  She denies any bleeding. Continue oral iron. 4.Leukopenia WBC3000 with an Carbon of1000 today. Patient has an undifferentiated connective tissue disorder. She is on no new medications or herbal products.  She has had an interval COVID-19 infection.    Copper level was 67 (slightly low) today.  Anti-neutrophil antibodies were negative.  Continue neutropenic precautions.  Continue to monitor. 5.B12 deficiency B12 was 1336 on 09/05/2019. She is on oral B12.             She has pernicious anemia. Folate was 48 on 09/05/2019.  Dr. Burton Apley and folate annually. 6.   Copper deficiency  Copper was 63 on 02/21/2019 and 67 (slightly low) today.  She is on a MVI with copper.  She is also on copper 1 mg a day. 7.   RN:  Call patient at home about dose of copper in MVI and additional copper pill. 8.   RN:  refill ondansetron and lactulose. 9.   MD:  Discuss renal lesion noted in 03/2020 with radiology (compare to 04/18/2020). 10.   RTC in 3 months for MD assess and labs (CBC with diff, CMP, myeloma panel, FLCA).  I discussed the assessment and treatment plan with the patient.  The patient was provided an opportunity to ask questions and all were answered.  The patient agreed with the plan and demonstrated an understanding of the instructions.  The patient was advised to call back if the symptoms worsen or if the condition fails to improve as anticipated.   Lequita Asal, MD, PhD     06/10/2020, 9:47 AM  I, Mirian Mo Tufford, am acting as Education administrator for [employer]. Mike Gip, MD, PhD.  I, Taneisha Fuson C. Mike Gip, MD, have reviewed the above documentation for accuracy and completeness, and I agree with the above.

## 2020-06-10 ENCOUNTER — Other Ambulatory Visit: Payer: Self-pay

## 2020-06-10 ENCOUNTER — Inpatient Hospital Stay: Payer: Medicare HMO

## 2020-06-10 ENCOUNTER — Telehealth: Payer: Self-pay | Admitting: *Deleted

## 2020-06-10 ENCOUNTER — Other Ambulatory Visit: Payer: Self-pay | Admitting: Hematology and Oncology

## 2020-06-10 ENCOUNTER — Inpatient Hospital Stay: Payer: Medicare HMO | Attending: Hematology and Oncology | Admitting: Hematology and Oncology

## 2020-06-10 ENCOUNTER — Encounter: Payer: Self-pay | Admitting: Hematology and Oncology

## 2020-06-10 VITALS — BP 182/88 | HR 56 | Temp 97.8°F | Wt 120.2 lb

## 2020-06-10 DIAGNOSIS — D472 Monoclonal gammopathy: Secondary | ICD-10-CM | POA: Diagnosis not present

## 2020-06-10 DIAGNOSIS — E610 Copper deficiency: Secondary | ICD-10-CM

## 2020-06-10 DIAGNOSIS — D509 Iron deficiency anemia, unspecified: Secondary | ICD-10-CM

## 2020-06-10 DIAGNOSIS — D510 Vitamin B12 deficiency anemia due to intrinsic factor deficiency: Secondary | ICD-10-CM | POA: Diagnosis not present

## 2020-06-10 DIAGNOSIS — D72819 Decreased white blood cell count, unspecified: Secondary | ICD-10-CM

## 2020-06-10 DIAGNOSIS — E538 Deficiency of other specified B group vitamins: Secondary | ICD-10-CM

## 2020-06-10 LAB — COMPREHENSIVE METABOLIC PANEL
ALT: 15 U/L (ref 0–44)
AST: 19 U/L (ref 15–41)
Albumin: 3.1 g/dL — ABNORMAL LOW (ref 3.5–5.0)
Alkaline Phosphatase: 40 U/L (ref 38–126)
Anion gap: 11 (ref 5–15)
BUN: 12 mg/dL (ref 8–23)
CO2: 28 mmol/L (ref 22–32)
Calcium: 9.5 mg/dL (ref 8.9–10.3)
Chloride: 97 mmol/L — ABNORMAL LOW (ref 98–111)
Creatinine, Ser: 0.75 mg/dL (ref 0.44–1.00)
GFR, Estimated: >60 mL/min (ref >60)
Glucose, Bld: 95 mg/dL (ref 70–99)
Potassium: 3.7 mmol/L (ref 3.5–5.1)
Sodium: 136 mmol/L (ref 135–145)
Total Bilirubin: 0.6 mg/dL (ref 0.3–1.2)
Total Protein: 8 g/dL (ref 6.5–8.1)

## 2020-06-10 LAB — CBC WITH DIFFERENTIAL/PLATELET
Abs Immature Granulocytes: 0 10*3/uL (ref 0.00–0.07)
Basophils Absolute: 0 10*3/uL (ref 0.0–0.1)
Basophils Relative: 1 %
Eosinophils Absolute: 0.1 10*3/uL (ref 0.0–0.5)
Eosinophils Relative: 4 %
HCT: 34.8 % — ABNORMAL LOW (ref 36.0–46.0)
Hemoglobin: 11.7 g/dL — ABNORMAL LOW (ref 12.0–15.0)
Immature Granulocytes: 0 %
Lymphocytes Relative: 26 %
Lymphs Abs: 0.8 10*3/uL (ref 0.7–4.0)
MCH: 32.1 pg (ref 26.0–34.0)
MCHC: 33.6 g/dL (ref 30.0–36.0)
MCV: 95.3 fL (ref 80.0–100.0)
Monocytes Absolute: 0.3 10*3/uL (ref 0.1–1.0)
Monocytes Relative: 11 %
Neutro Abs: 1.8 10*3/uL (ref 1.7–7.7)
Neutrophils Relative %: 58 %
Platelets: 171 10*3/uL (ref 150–400)
RBC: 3.65 MIL/uL — ABNORMAL LOW (ref 3.87–5.11)
RDW: 14.4 % (ref 11.5–15.5)
WBC: 3 10*3/uL — ABNORMAL LOW (ref 4.0–10.5)
nRBC: 0 % (ref 0.0–0.2)

## 2020-06-10 LAB — IRON AND TIBC
Iron: 104 ug/dL (ref 28–170)
Saturation Ratios: 49 % — ABNORMAL HIGH (ref 10.4–31.8)
TIBC: 211 ug/dL — ABNORMAL LOW (ref 250–450)
UIBC: 107 ug/dL

## 2020-06-10 LAB — FERRITIN: Ferritin: 35 ng/mL (ref 11–307)

## 2020-06-10 MED ORDER — ONDANSETRON 4 MG PO TBDP: 4.0000 mg | ORAL_TABLET | Freq: Three times a day (TID) | ORAL | 0 refills | Status: DC | PRN

## 2020-06-10 MED ORDER — LACTULOSE 10 GM/15ML PO SOLN: 20.0000 g | Freq: Every day | ORAL | 0 refills | Status: DC | PRN

## 2020-06-10 NOTE — Telephone Encounter (Signed)
Patient called to report the copper content of her vitamins. Copper 1 mg MVI 0.5mg  copper

## 2020-06-11 LAB — KAPPA/LAMBDA LIGHT CHAINS
Kappa free light chain: 17.6 mg/L (ref 3.3–19.4)
Kappa, lambda light chain ratio: 0.95 (ref 0.26–1.65)
Lambda free light chains: 18.6 mg/L (ref 5.7–26.3)

## 2020-06-12 LAB — PROTEIN ELECTROPHORESIS, SERUM
A/G Ratio: 0.8 (ref 0.7–1.7)
Albumin ELP: 3.5 g/dL (ref 2.9–4.4)
Alpha-1-Globulin: 0.2 g/dL (ref 0.0–0.4)
Alpha-2-Globulin: 0.7 g/dL (ref 0.4–1.0)
Beta Globulin: 3.5 g/dL — ABNORMAL HIGH (ref 0.7–1.3)
Gamma Globulin: 0.2 g/dL — ABNORMAL LOW (ref 0.4–1.8)
Globulin, Total: 4.5 g/dL — ABNORMAL HIGH (ref 2.2–3.9)
M-Spike, %: 2.6 g/dL — ABNORMAL HIGH
Total Protein ELP: 8 g/dL (ref 6.0–8.5)

## 2020-06-14 LAB — COPPER, SERUM: Copper: 67 ug/dL — ABNORMAL LOW (ref 80–158)

## 2020-06-15 ENCOUNTER — Other Ambulatory Visit: Payer: Self-pay

## 2020-06-15 DIAGNOSIS — E610 Copper deficiency: Secondary | ICD-10-CM

## 2020-06-15 DIAGNOSIS — D472 Monoclonal gammopathy: Secondary | ICD-10-CM

## 2020-06-15 NOTE — Progress Notes (Signed)
Called patient and infromed her of cooper result. Patient states she will take the MVI + copper and 1.5 tablets of the cooper supplement. Patient will recheck copper level in 1 month. Patient verbalizes understanding.

## 2020-06-17 ENCOUNTER — Ambulatory Visit: Payer: Medicare HMO

## 2020-07-16 ENCOUNTER — Other Ambulatory Visit: Payer: Self-pay

## 2020-07-16 ENCOUNTER — Inpatient Hospital Stay: Payer: Medicare HMO | Attending: Hematology and Oncology

## 2020-07-16 DIAGNOSIS — E610 Copper deficiency: Secondary | ICD-10-CM | POA: Diagnosis not present

## 2020-07-16 DIAGNOSIS — D509 Iron deficiency anemia, unspecified: Secondary | ICD-10-CM | POA: Insufficient documentation

## 2020-07-16 DIAGNOSIS — D472 Monoclonal gammopathy: Secondary | ICD-10-CM

## 2020-07-18 LAB — COPPER, SERUM: Copper: 63 ug/dL — ABNORMAL LOW (ref 80–158)

## 2020-09-22 ENCOUNTER — Ambulatory Visit: Payer: Medicare HMO | Admitting: Nurse Practitioner

## 2020-09-22 ENCOUNTER — Other Ambulatory Visit: Payer: Medicare HMO

## 2020-09-23 ENCOUNTER — Other Ambulatory Visit: Payer: Self-pay | Admitting: *Deleted

## 2020-09-23 DIAGNOSIS — D472 Monoclonal gammopathy: Secondary | ICD-10-CM

## 2020-09-25 ENCOUNTER — Inpatient Hospital Stay: Payer: Medicare HMO | Admitting: Oncology

## 2020-09-25 ENCOUNTER — Inpatient Hospital Stay: Payer: Medicare HMO | Attending: Oncology

## 2020-09-25 ENCOUNTER — Other Ambulatory Visit: Payer: Self-pay

## 2020-09-25 ENCOUNTER — Encounter: Payer: Self-pay | Admitting: Oncology

## 2020-09-25 VITALS — BP 169/70 | HR 61 | Temp 97.8°F | Resp 20 | Wt 121.6 lb

## 2020-09-25 DIAGNOSIS — Z79899 Other long term (current) drug therapy: Secondary | ICD-10-CM | POA: Diagnosis not present

## 2020-09-25 DIAGNOSIS — Z7982 Long term (current) use of aspirin: Secondary | ICD-10-CM | POA: Diagnosis not present

## 2020-09-25 DIAGNOSIS — E610 Copper deficiency: Secondary | ICD-10-CM

## 2020-09-25 DIAGNOSIS — D472 Monoclonal gammopathy: Secondary | ICD-10-CM

## 2020-09-25 DIAGNOSIS — I10 Essential (primary) hypertension: Secondary | ICD-10-CM | POA: Diagnosis not present

## 2020-09-25 DIAGNOSIS — D649 Anemia, unspecified: Secondary | ICD-10-CM | POA: Diagnosis not present

## 2020-09-25 DIAGNOSIS — C9000 Multiple myeloma not having achieved remission: Secondary | ICD-10-CM | POA: Insufficient documentation

## 2020-09-25 LAB — COMPREHENSIVE METABOLIC PANEL
ALT: 12 U/L (ref 0–44)
AST: 19 U/L (ref 15–41)
Albumin: 3.2 g/dL — ABNORMAL LOW (ref 3.5–5.0)
Alkaline Phosphatase: 46 U/L (ref 38–126)
Anion gap: 8 (ref 5–15)
BUN: 12 mg/dL (ref 8–23)
CO2: 30 mmol/L (ref 22–32)
Calcium: 9.4 mg/dL (ref 8.9–10.3)
Chloride: 99 mmol/L (ref 98–111)
Creatinine, Ser: 0.83 mg/dL (ref 0.44–1.00)
GFR, Estimated: >60 mL/min (ref >60)
Glucose, Bld: 107 mg/dL — ABNORMAL HIGH (ref 70–99)
Potassium: 4 mmol/L (ref 3.5–5.1)
Sodium: 137 mmol/L (ref 135–145)
Total Bilirubin: 0.6 mg/dL (ref 0.3–1.2)
Total Protein: 8.1 g/dL (ref 6.5–8.1)

## 2020-09-25 LAB — CBC WITH DIFFERENTIAL/PLATELET
Abs Immature Granulocytes: 0.01 10*3/uL (ref 0.00–0.07)
Basophils Absolute: 0.1 10*3/uL (ref 0.0–0.1)
Basophils Relative: 1 %
Eosinophils Absolute: 0.2 10*3/uL (ref 0.0–0.5)
Eosinophils Relative: 7 %
HCT: 33.3 % — ABNORMAL LOW (ref 36.0–46.0)
Hemoglobin: 11.4 g/dL — ABNORMAL LOW (ref 12.0–15.0)
Immature Granulocytes: 0 %
Lymphocytes Relative: 32 %
Lymphs Abs: 1.1 10*3/uL (ref 0.7–4.0)
MCH: 32.7 pg (ref 26.0–34.0)
MCHC: 34.2 g/dL (ref 30.0–36.0)
MCV: 95.4 fL (ref 80.0–100.0)
Monocytes Absolute: 0.5 10*3/uL (ref 0.1–1.0)
Monocytes Relative: 14 %
Neutro Abs: 1.6 10*3/uL — ABNORMAL LOW (ref 1.7–7.7)
Neutrophils Relative %: 46 %
Platelets: 192 10*3/uL (ref 150–400)
RBC: 3.49 MIL/uL — ABNORMAL LOW (ref 3.87–5.11)
RDW: 14.4 % (ref 11.5–15.5)
WBC: 3.6 10*3/uL — ABNORMAL LOW (ref 4.0–10.5)
nRBC: 0 % (ref 0.0–0.2)

## 2020-09-25 NOTE — Progress Notes (Signed)
Patient states she has a few tender spots under left breast. She says with a light touch she will have a shooting pain.

## 2020-09-25 NOTE — Progress Notes (Signed)
Hematology/Oncology Consult note [facility]  Telephone:([phone] [phone]  Patient Care Team: Barbaraann Boys, MD as PCP - General (Pediatrics) Sindy Guadeloupe, MD as Consulting Physician (Oncology)   Name of the patient: Janet Duffy  [id number]  March 26, 1932   Date of visit: 09/25/20  Diagnosis-IgA lambda MGUS History of copper deficiency Mild neutropenia  Chief complaint/ Reason for visit-we will routine follow-up of MGUS and copper deficiency  Heme/Onc history: Patient is a 87 year old female and this is a transfer of care from Dr. Mike Gip.  Patient was diagnosed with IgA lambda MGUS in 2007.  No evidence of progression so far.  M spike on myeloma panel has been around 2.5.  No crab criteria.  PET scan in 11/22/2018 did not show any evidence of active myeloma.Bone marrow biopsy on 11/19/2018 revealed a hypercellular marrow for age (40%). Myeloid and erythroid elements were present in normal proportions. Megakaryocytes demonstrated a spectrum of maturation without clustering. There were no atypical lymphoid aggregates (CD20, CD3) or granulomas. CD138 highlighted an increased plasma cell population (~10%) which were lambda restricted.  A subset of the plasma cells also expressed CD56.   Differential included a non-IgM monoclonal gammopathy of undetermined significance (MGUS) and plasma cell myeloma.    Patient has mild neutropenia possibly due to copper deficiency for which patient is on copper supplements.  She also has a history of iron deficiency in the past.  Interval history-patient currently reports pain in her left chest wall which is worse on lying down or movements.  It started about 2 weeks ago and is a little better at this time but has not completely resolved.  ECOG PS- 1 Pain scale- 3 Opioid associated constipation- no  Review of systems- Review of Systems  Constitutional:  Negative for chills, fever, malaise/fatigue and weight  loss.  HENT:  Negative for congestion, ear discharge and nosebleeds.   Eyes:  Negative for blurred vision.  Respiratory:  Negative for cough, hemoptysis, sputum production, shortness of breath and wheezing.   Cardiovascular:  Negative for chest pain, palpitations, orthopnea and claudication.  Gastrointestinal:  Negative for abdominal pain, blood in stool, constipation, diarrhea, heartburn, melena, nausea and vomiting.  Genitourinary:  Negative for dysuria, flank pain, frequency, hematuria and urgency.  Musculoskeletal:  Negative for back pain, joint pain and myalgias.       Left chest wall pain  Skin:  Negative for rash.  Neurological:  Negative for dizziness, tingling, focal weakness, seizures, weakness and headaches.  Endo/Heme/Allergies:  Does not bruise/bleed easily.  Psychiatric/Behavioral:  Negative for depression and suicidal ideas. The patient does not have insomnia.       Allergies  Allergen Reactions   Clindamycin/Lincomycin Anaphylaxis   Codeine Nausea Only   Erythromycin Anaphylaxis   Ivp Dye [Iodinated Diagnostic Agents] Anaphylaxis   Metrizamide Anaphylaxis   Monosodium Glutamate Shortness Of Breath and Swelling    Tongue swelling   Moxifloxacin Anaphylaxis   Penicillins Anaphylaxis and Rash    .Has patient had a PCN reaction causing immediate rash, facial/tongue/throat swelling, SOB or lightheadedness with hypotension: Yes Has patient had a PCN reaction causing severe rash involving mucus membranes or skin necrosis: No Has patient had a PCN reaction that required hospitalization: Unknown Has patient had a PCN reaction occurring within the last 10 years: Yes If all of the above answers are "NO", then may proceed with Cephalosporin use.    Tramadol     Patient discloses she "went crazy"   Amlodipine Swelling  Avelox [Moxifloxacin Hcl In Nacl]    Shellfish Allergy Hives    seafood   Zoster Vaccine Live Rash     Past Medical History:  Diagnosis Date    Connective tissue disease ([place])    DDD (degenerative disc disease), lumbar    Diverticulitis    Granuloma annulare    HTN (hypertension)    Osteoporosis      Past Surgical History:  Procedure Laterality Date   ABDOMINAL HYSTERECTOMY     BACK SURGERY      Social History   Socioeconomic History   Marital status: Married    Spouse name: Not on file   Number of children: Not on file   Years of education: Not on file   Highest education level: Not on file  Occupational History   Not on file  Tobacco Use   Smoking status: Never   Smokeless tobacco: Never  Vaping Use   Vaping Use: Never used  Substance and Sexual Activity   Alcohol use: No   Drug use: No   Sexual activity: Not on file  Other Topics Concern   Not on file  Social History Narrative   Not on file   Social Determinants of Health   Financial Resource Strain: Not on file  Food Insecurity: Not on file  Transportation Needs: Not on file  Physical Activity: Not on file  Stress: Not on file  Social Connections: Not on file  Intimate Partner Violence: Not on file    Family History  Problem Relation Age of Onset   Ovarian cancer Mother    Breast cancer Sister    Leukemia Brother    Colon cancer Brother      Current Outpatient Medications:    acetaminophen (TYLENOL) 325 MG tablet, Take 2 tablets (650 mg total) by mouth every 6 (six) hours as needed for mild pain (or Fever >/= 101)., Disp: , Rfl:    aspirin EC 81 MG EC tablet, Take 1 tablet (81 mg total) by mouth daily., Disp: 30 tablet, Rfl: 0   carvedilol (COREG) 25 MG tablet, Take 25 mg by mouth 2 (two) times daily., Disp: , Rfl:    chlorthalidone (HYGROTON) 25 MG tablet, Take 25 mg by mouth daily as needed., Disp: , Rfl:    Cholecalciferol (VITAMIN D3) 1000 units CAPS, Take 1,000 Units by mouth daily., Disp: , Rfl:    ferrous sulfate 325 (65 FE) MG tablet, Take 325 mg by mouth daily with breakfast., Disp: , Rfl:    hydrALAZINE (APRESOLINE) 50 MG  tablet, Take 1 tablet (50 mg total) by mouth 2 (two) times daily. (Patient taking differently: Take 50 mg by mouth See admin instructions. Take 1 tablet (59m) by mouth twice daily - may take 1 tablet (583m at lunchtime if needed), Disp: 30 tablet, Rfl: 0   lactulose (CHRONULAC) 10 GM/15ML solution, Take 30 mLs (20 g total) by mouth daily as needed for mild constipation., Disp: 120 mL, Rfl: 0   lisinopril (PRINIVIL,ZESTRIL) 40 MG tablet, Take 1 tablet (40 mg total) by mouth daily., Disp: 30 tablet, Rfl: 0   ondansetron (ZOFRAN ODT) 4 MG disintegrating tablet, Take 1 tablet (4 mg total) by mouth every 8 (eight) hours as needed for nausea or vomiting., Disp: 20 tablet, Rfl: 0   vitamin B-12 (CYANOCOBALAMIN) 1000 MCG tablet, Take 1,000 mcg by mouth daily., Disp: , Rfl:    Wheat Dextrin (BENEFIBER) POWD, Take 1 Dose by mouth daily., Disp: , Rfl:   Physical exam:  Vitals:  09/25/20 0934  BP: (!) 169/70  Pulse: 61  Resp: 20  Temp: 97.8 F (36.6 C)  SpO2: 100%  Weight: 121 lb 9.6 oz (55.2 kg)   Physical Exam Constitutional:      General: She is not in acute distress. Cardiovascular:     Rate and Rhythm: Normal rate and regular rhythm.     Heart sounds: Normal heart sounds.  Pulmonary:     Effort: Pulmonary effort is normal.     Breath sounds: Normal breath sounds.  Abdominal:     General: Bowel sounds are normal.     Palpations: Abdomen is soft.  Musculoskeletal:     Comments: There is tenderness to palpation over the 10th rib in the midclavicular line  Skin:    General: Skin is warm and dry.  Neurological:     Mental Status: She is alert and oriented to person, place, and time.     CMP Latest Ref Rng & Units 09/25/2020  Glucose 70 - 99 mg/dL 107(H)  BUN 8 - 23 mg/dL 12  Creatinine 0.44 - 1.00 mg/dL 0.83  Sodium 135 - 145 mmol/L 137  Potassium 3.5 - 5.1 mmol/L 4.0  Chloride 98 - 111 mmol/L 99  CO2 22 - 32 mmol/L 30  Calcium 8.9 - 10.3 mg/dL 9.4  Total Protein 6.5 - 8.1 g/dL  8.1  Total Bilirubin 0.3 - 1.2 mg/dL 0.6  Alkaline Phos 38 - 126 U/L 46  AST 15 - 41 U/L 19  ALT 0 - 44 U/L 12   CBC Latest Ref Rng & Units 09/25/2020  WBC 4.0 - 10.5 K/uL 3.6(L)  Hemoglobin 12.0 - 15.0 g/dL 11.4(L)  Hematocrit 36.0 - 46.0 % 33.3(L)  Platelets 150 - 400 K/uL 192    No images are attached to the encounter.  No results found.   Assessment and plan- Patient is a 87 y.o. female with IgA lambda MGUS here for follow-up  IgA lambda MGUS: Myeloma panel and serum free light chains from today are not back.  Based on her last levels done 3 months ago they seem to be overall stable with no concern for progression to smoldering or overt multiple myeloma.  She has mild baseline anemia with a hemoglobin between 11-12 which has remained stable.  Renal functions and serum calcium currently stable.  Copper deficiency: She is on oral copper supplements and levels from today are pending  Repeat CBC ferritin iron studies copper levels myeloma panel and serum free light chains in 3 months in 6 months and see me back in 6 months.  Suspect left chest wall pain is musculoskeletal.  If it does not improve over the next 2 weeks patient will let us know   Visit Diagnosis 1. MGUS (monoclonal gammopathy of unknown significance)   2. Copper deficiency      Dr. Randa Evens, MD, MPH [facility] [phone] 09/25/2020 5:05 PM

## 2020-09-26 LAB — COPPER, SERUM: Copper: 62 ug/dL — ABNORMAL LOW (ref 80–158)

## 2020-09-28 LAB — MULTIPLE MYELOMA PANEL, SERUM
Albumin SerPl Elph-Mcnc: 3.3 g/dL (ref 2.9–4.4)
Albumin/Glob SerPl: 0.8 (ref 0.7–1.7)
Alpha 1: 0.2 g/dL (ref 0.0–0.4)
Alpha2 Glob SerPl Elph-Mcnc: 0.6 g/dL (ref 0.4–1.0)
B-Globulin SerPl Elph-Mcnc: 3.7 g/dL — ABNORMAL HIGH (ref 0.7–1.3)
Gamma Glob SerPl Elph-Mcnc: 0.1 g/dL — ABNORMAL LOW (ref 0.4–1.8)
Globulin, Total: 4.6 g/dL — ABNORMAL HIGH (ref 2.2–3.9)
IgA: 2648 mg/dL — ABNORMAL HIGH (ref 64–422)
IgG (Immunoglobin G), Serum: 884 mg/dL (ref 586–1602)
IgM (Immunoglobulin M), Srm: 26 mg/dL (ref 26–217)
M Protein SerPl Elph-Mcnc: 3 g/dL — ABNORMAL HIGH
Total Protein ELP: 7.9 g/dL (ref 6.0–8.5)

## 2020-09-28 LAB — KAPPA/LAMBDA LIGHT CHAINS
Kappa free light chain: 20.7 mg/L — ABNORMAL HIGH (ref 3.3–19.4)
Kappa, lambda light chain ratio: 1.04 (ref 0.26–1.65)
Lambda free light chains: 19.9 mg/L (ref 5.7–26.3)

## 2020-12-24 ENCOUNTER — Inpatient Hospital Stay: Payer: Medicare HMO | Attending: Oncology

## 2020-12-24 ENCOUNTER — Other Ambulatory Visit: Payer: Self-pay | Admitting: *Deleted

## 2020-12-24 DIAGNOSIS — E610 Copper deficiency: Secondary | ICD-10-CM

## 2020-12-24 DIAGNOSIS — D709 Neutropenia, unspecified: Secondary | ICD-10-CM | POA: Diagnosis not present

## 2020-12-24 DIAGNOSIS — D472 Monoclonal gammopathy: Secondary | ICD-10-CM | POA: Diagnosis not present

## 2020-12-24 DIAGNOSIS — D509 Iron deficiency anemia, unspecified: Secondary | ICD-10-CM

## 2020-12-24 LAB — IRON AND TIBC
Iron: 92 ug/dL (ref 28–170)
Saturation Ratios: 47 % — ABNORMAL HIGH (ref 10.4–31.8)
TIBC: 197 ug/dL — ABNORMAL LOW (ref 250–450)
UIBC: 105 ug/dL

## 2020-12-24 LAB — CBC
HCT: 33.9 % — ABNORMAL LOW (ref 36.0–46.0)
Hemoglobin: 11.5 g/dL — ABNORMAL LOW (ref 12.0–15.0)
MCH: 32.4 pg (ref 26.0–34.0)
MCHC: 33.9 g/dL (ref 30.0–36.0)
MCV: 95.5 fL (ref 80.0–100.0)
Platelets: 173 10*3/uL (ref 150–400)
RBC: 3.55 MIL/uL — ABNORMAL LOW (ref 3.87–5.11)
RDW: 14 % (ref 11.5–15.5)
WBC: 2.9 10*3/uL — ABNORMAL LOW (ref 4.0–10.5)
nRBC: 0 % (ref 0.0–0.2)

## 2020-12-24 LAB — FERRITIN: Ferritin: 30 ng/mL (ref 11–307)

## 2020-12-25 LAB — COPPER, SERUM: Copper: 65 ug/dL — ABNORMAL LOW (ref 80–158)

## 2020-12-25 LAB — KAPPA/LAMBDA LIGHT CHAINS
Kappa free light chain: 16.5 mg/L (ref 3.3–19.4)
Kappa, lambda light chain ratio: 1.06 (ref 0.26–1.65)
Lambda free light chains: 15.5 mg/L (ref 5.7–26.3)

## 2020-12-28 LAB — MULTIPLE MYELOMA PANEL, SERUM
Albumin SerPl Elph-Mcnc: 3.3 g/dL (ref 2.9–4.4)
Albumin/Glob SerPl: 0.8 (ref 0.7–1.7)
Alpha 1: 0.2 g/dL (ref 0.0–0.4)
Alpha2 Glob SerPl Elph-Mcnc: 0.6 g/dL (ref 0.4–1.0)
B-Globulin SerPl Elph-Mcnc: 0.8 g/dL (ref 0.7–1.3)
Gamma Glob SerPl Elph-Mcnc: 2.9 g/dL — ABNORMAL HIGH (ref 0.4–1.8)
Globulin, Total: 4.4 g/dL — ABNORMAL HIGH (ref 2.2–3.9)
IgA: 2371 mg/dL — ABNORMAL HIGH (ref 64–422)
IgG (Immunoglobin G), Serum: 924 mg/dL (ref 586–1602)
IgM (Immunoglobulin M), Srm: 26 mg/dL (ref 26–217)
M Protein SerPl Elph-Mcnc: 2.7 g/dL — ABNORMAL HIGH
Total Protein ELP: 7.7 g/dL (ref 6.0–8.5)

## 2021-01-11 ENCOUNTER — Encounter: Payer: Self-pay | Admitting: Oncology

## 2021-04-02 ENCOUNTER — Inpatient Hospital Stay (HOSPITAL_BASED_OUTPATIENT_CLINIC_OR_DEPARTMENT_OTHER): Payer: Medicare HMO | Admitting: Oncology

## 2021-04-02 ENCOUNTER — Other Ambulatory Visit: Payer: Self-pay

## 2021-04-02 ENCOUNTER — Encounter: Payer: Self-pay | Admitting: Oncology

## 2021-04-02 ENCOUNTER — Inpatient Hospital Stay: Payer: Medicare HMO | Attending: Oncology

## 2021-04-02 VITALS — BP 150/75 | HR 65 | Temp 98.5°F | Resp 16 | Wt 120.7 lb

## 2021-04-02 DIAGNOSIS — D509 Iron deficiency anemia, unspecified: Secondary | ICD-10-CM | POA: Insufficient documentation

## 2021-04-02 DIAGNOSIS — Z79899 Other long term (current) drug therapy: Secondary | ICD-10-CM | POA: Diagnosis not present

## 2021-04-02 DIAGNOSIS — D472 Monoclonal gammopathy: Secondary | ICD-10-CM

## 2021-04-02 DIAGNOSIS — D709 Neutropenia, unspecified: Secondary | ICD-10-CM | POA: Insufficient documentation

## 2021-04-02 DIAGNOSIS — E610 Copper deficiency: Secondary | ICD-10-CM

## 2021-04-02 LAB — IRON AND TIBC
Iron: 99 ug/dL (ref 28–170)
Saturation Ratios: 48 % — ABNORMAL HIGH (ref 10.4–31.8)
TIBC: 209 ug/dL — ABNORMAL LOW (ref 250–450)
UIBC: 110 ug/dL

## 2021-04-02 LAB — CBC
HCT: 33.6 % — ABNORMAL LOW (ref 36.0–46.0)
Hemoglobin: 11.1 g/dL — ABNORMAL LOW (ref 12.0–15.0)
MCH: 31.6 pg (ref 26.0–34.0)
MCHC: 33 g/dL (ref 30.0–36.0)
MCV: 95.7 fL (ref 80.0–100.0)
Platelets: 192 10*3/uL (ref 150–400)
RBC: 3.51 MIL/uL — ABNORMAL LOW (ref 3.87–5.11)
RDW: 14.6 % (ref 11.5–15.5)
WBC: 4.6 10*3/uL (ref 4.0–10.5)
nRBC: 0 % (ref 0.0–0.2)

## 2021-04-02 LAB — FERRITIN: Ferritin: 79 ng/mL (ref 11–307)

## 2021-04-02 NOTE — Progress Notes (Signed)
Patient here for follow up. She reports that she had Covid approximally 3 weeks ago but has since tested negative. She has a lingering non productive cough and fatigue.

## 2021-04-05 NOTE — Progress Notes (Signed)
Hematology/Oncology Consult note [facility]  [phone] [phone]  Patient Care Team: Barbaraann Boys, MD as PCP - General (Pediatrics) Sindy Guadeloupe, MD as Consulting Physician (Oncology)   Name of the patient: Janet Duffy  [id number]  [birth year]   Date of visit: 04/05/21  Diagnosis-IgA lambda MGUS  Chief complaint/ Reason for visit-routine follow-up of IgA lambda MGUS  Heme/Onc history: Patient is a 88 year old female and this is a transfer of care from Dr. Mike Gip.  Patient was diagnosed with IgA lambda MGUS in 2007.  No evidence of progression so far.  M spike on myeloma panel has been around 2.5.  No crab criteria.  PET scan in 11/22/2018 did not show any evidence of active myeloma.Bone marrow biopsy on 11/19/2018 revealed a hypercellular marrow for age (40%). Myeloid and erythroid elements were present in normal proportions. Megakaryocytes demonstrated a spectrum of maturation without clustering. There were no atypical lymphoid aggregates (CD20, CD3) or granulomas. CD138 highlighted an increased plasma cell population (~10%) which were lambda restricted.  A subset of the plasma cells also expressed CD56.   Differential included a non-IgM monoclonal gammopathy of undetermined significance (MGUS) and plasma cell myeloma.    Patient has mild neutropenia possibly due to copper deficiency for which patient is on copper supplements.  She also has a history of iron deficiency in the past.    Interval history-patient is doing well for her age.  No recent hospitalizations.  She recently recovered from [place] about 3 weeks ago.  Still has some ongoing cough and fatigue.  ECOG PS- 1 Pain scale- 0   Review of systems- Review of Systems  Constitutional:  Positive for malaise/fatigue. Negative for chills, fever and weight loss.  HENT:  Negative for congestion, ear discharge and nosebleeds.   Eyes:  Negative for blurred vision.  Respiratory:   Positive for cough. Negative for hemoptysis, sputum production, shortness of breath and wheezing.   Cardiovascular:  Negative for chest pain, palpitations, orthopnea and claudication.  Gastrointestinal:  Negative for abdominal pain, blood in stool, constipation, diarrhea, heartburn, melena, nausea and vomiting.  Genitourinary:  Negative for dysuria, flank pain, frequency, hematuria and urgency.  Musculoskeletal:  Negative for back pain, joint pain and myalgias.  Skin:  Negative for rash.  Neurological:  Negative for dizziness, tingling, focal weakness, seizures, weakness and headaches.  Endo/Heme/Allergies:  Does not bruise/bleed easily.  Psychiatric/Behavioral:  Negative for depression and suicidal ideas. The patient does not have insomnia.      Allergies  Allergen Reactions   Clindamycin/Lincomycin Anaphylaxis   Codeine Nausea Only   Erythromycin Anaphylaxis   Ivp Dye [Iodinated Contrast Media] Anaphylaxis   Metrizamide Anaphylaxis   Monosodium Glutamate Shortness Of Breath and Swelling    Tongue swelling   Moxifloxacin Anaphylaxis   Penicillins Anaphylaxis and Rash    .Has patient had a PCN reaction causing immediate rash, facial/tongue/throat swelling, SOB or lightheadedness with hypotension: Yes Has patient had a PCN reaction causing severe rash involving mucus membranes or skin necrosis: No Has patient had a PCN reaction that required hospitalization: Unknown Has patient had a PCN reaction occurring within the last 10 years: Yes If all of the above answers are "NO", then may proceed with Cephalosporin use.    Tramadol     Patient discloses she "went crazy"   Amlodipine Swelling   Avelox [Moxifloxacin Hcl In Nacl]    Shellfish Allergy Hives    seafood   Zoster Vaccine Live Rash  Past Medical History:  Diagnosis Date   Connective tissue disease ([place])    DDD (degenerative disc disease), lumbar    Diverticulitis    Granuloma annulare    HTN (hypertension)     Osteoporosis      Past Surgical History:  Procedure Laterality Date   ABDOMINAL HYSTERECTOMY     BACK SURGERY      Social History   Socioeconomic History   Marital status: Married    Spouse name: Not on file   Number of children: Not on file   Years of education: Not on file   Highest education level: Not on file  Occupational History   Not on file  Tobacco Use   Smoking status: Never   Smokeless tobacco: Never  Vaping Use   Vaping Use: Never used  Substance and Sexual Activity   Alcohol use: No   Drug use: No   Sexual activity: Not on file  Other Topics Concern   Not on file  Social History Narrative   Not on file   Social Determinants of Health   Financial Resource Strain: Not on file  Food Insecurity: Not on file  Transportation Needs: Not on file  Physical Activity: Not on file  Stress: Not on file  Social Connections: Not on file  Intimate Partner Violence: Not on file    Family History  Problem Relation Age of Onset   Ovarian cancer Mother    Breast cancer Sister    Leukemia Brother    Colon cancer Brother      Current Outpatient Medications:    acetaminophen (TYLENOL) 325 MG tablet, Take 2 tablets (650 mg total) by mouth every 6 (six) hours as needed for mild pain (or Fever >/= 101)., Disp: , Rfl:    aspirin EC 81 MG EC tablet, Take 1 tablet (81 mg total) by mouth daily., Disp: 30 tablet, Rfl: 0   carvedilol (COREG) 25 MG tablet, Take 25 mg by mouth 2 (two) times daily., Disp: , Rfl:    chlorthalidone (HYGROTON) 25 MG tablet, Take 25 mg by mouth daily as needed., Disp: , Rfl:    Cholecalciferol (VITAMIN D3) 1000 units CAPS, Take 1,000 Units by mouth daily., Disp: , Rfl:    ferrous sulfate 325 (65 FE) MG tablet, Take 325 mg by mouth daily with breakfast., Disp: , Rfl:    hydrALAZINE (APRESOLINE) 50 MG tablet, Take 1 tablet (50 mg total) by mouth 2 (two) times daily. (Patient taking differently: Take 50 mg by mouth See admin instructions. Take 1  tablet (29m) by mouth twice daily - may take 1 tablet (548m at lunchtime if needed), Disp: 30 tablet, Rfl: 0   lactulose (CHRONULAC) 10 GM/15ML solution, Take 30 mLs (20 g total) by mouth daily as needed for mild constipation., Disp: 120 mL, Rfl: 0   lisinopril (PRINIVIL,ZESTRIL) 40 MG tablet, Take 1 tablet (40 mg total) by mouth daily., Disp: 30 tablet, Rfl: 0   ondansetron (ZOFRAN ODT) 4 MG disintegrating tablet, Take 1 tablet (4 mg total) by mouth every 8 (eight) hours as needed for nausea or vomiting., Disp: 20 tablet, Rfl: 0   vitamin B-12 (CYANOCOBALAMIN) 1000 MCG tablet, Take 1,000 mcg by mouth daily., Disp: , Rfl:    Wheat Dextrin (BENEFIBER) POWD, Take 1 Dose by mouth daily., Disp: , Rfl:   Physical exam:  Vitals:   04/02/21 1134  BP: (!) 150/75  Pulse: 65  Resp: 16  Temp: 98.5 F (36.9 C)  TempSrc: Tympanic  SpO2:  98%  Weight: 120 lb 11.2 oz (54.7 kg)   Physical Exam Constitutional:      General: She is not in acute distress. Cardiovascular:     Rate and Rhythm: Normal rate and regular rhythm.     Heart sounds: Normal heart sounds.  Pulmonary:     Effort: Pulmonary effort is normal.     Breath sounds: Normal breath sounds.  Abdominal:     General: Bowel sounds are normal.     Palpations: Abdomen is soft.  Skin:    General: Skin is warm and dry.  Neurological:     Mental Status: She is alert and oriented to person, place, and time.     CMP Latest Ref Rng & Units 09/25/2020  Glucose 70 - 99 mg/dL 107(H)  BUN 8 - 23 mg/dL 12  Creatinine 0.44 - 1.00 mg/dL 0.83  Sodium 135 - 145 mmol/L 137  Potassium 3.5 - 5.1 mmol/L 4.0  Chloride 98 - 111 mmol/L 99  CO2 22 - 32 mmol/L 30  Calcium 8.9 - 10.3 mg/dL 9.4  Total Protein 6.5 - 8.1 g/dL 8.1  Total Bilirubin 0.3 - 1.2 mg/dL 0.6  Alkaline Phos 38 - 126 U/L 46  AST 15 - 41 U/L 19  ALT 0 - 44 U/L 12   CBC Latest Ref Rng & Units 04/02/2021  WBC 4.0 - 10.5 K/uL 4.6  Hemoglobin 12.0 - 15.0 g/dL 11.1(L)  Hematocrit 36.0  - 46.0 % 33.6(L)  Platelets 150 - 400 K/uL 192     Assessment and plan- Patient is a 88 y.o. female with IgA lambda MGUS here for routine follow-up  IgA lambda MGUS:Patient is currently mildly anemic with an H&H of 11.1/33.6 which is at her baseline.  Ferritin and iron studies are within normal limits.  Low TIBC is indicated of anemia of chronic disease.  Myeloma panel and serum free light chains are currently pending but prior values have overall showedStable numbers with an M protein of 2.7.  Copper deficiency: Despite taking copper supplements her numbers have remained low but given that she is not anemic I have asked her to continue present dose of copper without any further changes.  CBC CMP myeloma panel serum free light chains in 4 months and see me thereafter   Visit Diagnosis 1. Copper deficiency   2. MGUS (monoclonal gammopathy of unknown significance)   3. Iron deficiency anemia, unspecified iron deficiency anemia type      Dr. Randa Evens, MD, MPH [facility] at [facility] [phone] 04/05/2021 10:11 AM

## 2021-04-06 LAB — KAPPA/LAMBDA LIGHT CHAINS
Kappa free light chain: 19.5 mg/L — ABNORMAL HIGH (ref 3.3–19.4)
Kappa, lambda light chain ratio: 0.87 (ref 0.26–1.65)
Lambda free light chains: 22.5 mg/L (ref 5.7–26.3)

## 2021-04-07 LAB — COPPER, SERUM: Copper: 76 ug/dL — ABNORMAL LOW (ref 80–158)

## 2021-04-08 LAB — MULTIPLE MYELOMA PANEL, SERUM
Albumin SerPl Elph-Mcnc: 3.2 g/dL (ref 2.9–4.4)
Albumin/Glob SerPl: 0.7 (ref 0.7–1.7)
Alpha 1: 0.3 g/dL (ref 0.0–0.4)
Alpha2 Glob SerPl Elph-Mcnc: 0.7 g/dL (ref 0.4–1.0)
B-Globulin SerPl Elph-Mcnc: 1.2 g/dL (ref 0.7–1.3)
Gamma Glob SerPl Elph-Mcnc: 2.5 g/dL — ABNORMAL HIGH (ref 0.4–1.8)
Globulin, Total: 4.6 g/dL — ABNORMAL HIGH (ref 2.2–3.9)
IgA: 2324 mg/dL — ABNORMAL HIGH (ref 64–422)
IgG (Immunoglobin G), Serum: 927 mg/dL (ref 586–1602)
IgM (Immunoglobulin M), Srm: 26 mg/dL (ref 26–217)
M Protein SerPl Elph-Mcnc: 1.5 g/dL — ABNORMAL HIGH
Total Protein ELP: 7.8 g/dL (ref 6.0–8.5)

## 2021-04-23 ENCOUNTER — Other Ambulatory Visit: Payer: Self-pay | Admitting: Orthopedic Surgery

## 2021-04-23 DIAGNOSIS — S76309A Unspecified injury of muscle, fascia and tendon of the posterior muscle group at thigh level, unspecified thigh, initial encounter: Secondary | ICD-10-CM

## 2021-05-01 ENCOUNTER — Ambulatory Visit
Admission: RE | Admit: 2021-05-01 | Discharge: 2021-05-01 | Disposition: A | Discharge status: Home / Self Care | Payer: Medicare HMO | Source: Ambulatory Visit | Attending: Orthopedic Surgery | Admitting: Orthopedic Surgery

## 2021-05-01 ENCOUNTER — Other Ambulatory Visit: Payer: Self-pay

## 2021-05-01 DIAGNOSIS — S76309A Unspecified injury of muscle, fascia and tendon of the posterior muscle group at thigh level, unspecified thigh, initial encounter: Secondary | ICD-10-CM | POA: Insufficient documentation

## 2021-08-02 ENCOUNTER — Encounter: Payer: Self-pay | Admitting: Oncology

## 2021-08-02 ENCOUNTER — Inpatient Hospital Stay: Payer: Medicare HMO | Attending: Oncology

## 2021-08-02 ENCOUNTER — Inpatient Hospital Stay: Payer: Medicare HMO | Admitting: Oncology

## 2021-08-02 VITALS — BP 163/80 | HR 66 | Temp 97.9°F | Resp 14 | Wt 115.8 lb

## 2021-08-02 DIAGNOSIS — D472 Monoclonal gammopathy: Secondary | ICD-10-CM

## 2021-08-02 DIAGNOSIS — D709 Neutropenia, unspecified: Secondary | ICD-10-CM | POA: Diagnosis not present

## 2021-08-02 DIAGNOSIS — I10 Essential (primary) hypertension: Secondary | ICD-10-CM | POA: Diagnosis not present

## 2021-08-02 DIAGNOSIS — E610 Copper deficiency: Secondary | ICD-10-CM

## 2021-08-02 DIAGNOSIS — Z862 Personal history of diseases of the blood and blood-forming organs and certain disorders involving the immune mechanism: Secondary | ICD-10-CM | POA: Diagnosis not present

## 2021-08-02 DIAGNOSIS — Z79899 Other long term (current) drug therapy: Secondary | ICD-10-CM | POA: Diagnosis not present

## 2021-08-02 DIAGNOSIS — D509 Iron deficiency anemia, unspecified: Secondary | ICD-10-CM

## 2021-08-02 LAB — CBC
HCT: 38.1 % (ref 36.0–46.0)
Hemoglobin: 12.7 g/dL (ref 12.0–15.0)
MCH: 31.9 pg (ref 26.0–34.0)
MCHC: 33.3 g/dL (ref 30.0–36.0)
MCV: 95.7 fL (ref 80.0–100.0)
Platelets: 216 10*3/uL (ref 150–400)
RBC: 3.98 MIL/uL (ref 3.87–5.11)
RDW: 14.9 % (ref 11.5–15.5)
WBC: 5 10*3/uL (ref 4.0–10.5)
nRBC: 0 % (ref 0.0–0.2)

## 2021-08-02 LAB — FERRITIN: Ferritin: 82 ng/mL (ref 11–307)

## 2021-08-02 NOTE — Progress Notes (Signed)
? ? ? ?Hematology/Oncology Consult note ?[facility]  ?[phone] [phone] ? ?Patient Care Team: ?Barbaraann Boys, MD as PCP - General (Pediatrics) ?Sindy Guadeloupe, MD as Consulting Physician (Oncology)  ? ?Name of the patient: Janet Duffy  ?[id number]  ?07-31-1931  ? ?Date of visit: 08/02/21 ? ?Diagnosis- IgA lambda MGUS ? ?Chief complaint/ Reason for visit-routine follow-up of MGUS ? ?Heme/Onc history: Patient is a 88 year old female and this is a transfer of care from Dr. Mike Gip.  Patient was diagnosed with IgA lambda MGUS in 2007.  No evidence of progression so far.  M spike on myeloma panel has been around 2.5.  No crab criteria.  PET scan in 11/22/2018 did not show any evidence of active myeloma.Bone marrow biopsy on 11/19/2018 revealed a hypercellular marrow for age (40%). Myeloid and erythroid elements were present in normal proportions. Megakaryocytes demonstrated a spectrum of maturation without clustering. There were no atypical lymphoid aggregates (CD20, CD3) or granulomas. CD138 highlighted an increased plasma cell population (~10%) which were lambda restricted.  A subset of the plasma cells also expressed CD56.   Differential included a non-IgM monoclonal gammopathy of undetermined significance (MGUS) and plasma cell myeloma.  ?  ?Patient has mild neutropenia possibly due to copper deficiency for which patient is on copper supplements.  She also has a history of iron deficiency in the past. ?  ? ?Interval history-patient is doing well for her age.  She lives with her husband and is independent of her ADLs and IADLs. ? ?ECOG PS- 1 ?Pain scale- 0 ? ? ?Review of systems- Review of Systems  ?Constitutional:  Negative for chills, fever, malaise/fatigue and weight loss.  ?HENT:  Negative for congestion, ear discharge and nosebleeds.   ?Eyes:  Negative for blurred vision.  ?Respiratory:  Negative for cough, hemoptysis, sputum production, shortness of breath and  wheezing.   ?Cardiovascular:  Negative for chest pain, palpitations, orthopnea and claudication.  ?Gastrointestinal:  Negative for abdominal pain, blood in stool, constipation, diarrhea, heartburn, melena, nausea and vomiting.  ?Genitourinary:  Negative for dysuria, flank pain, frequency, hematuria and urgency.  ?Musculoskeletal:  Negative for back pain, joint pain and myalgias.  ?Skin:  Negative for rash.  ?Neurological:  Negative for dizziness, tingling, focal weakness, seizures, weakness and headaches.  ?Endo/Heme/Allergies:  Does not bruise/bleed easily.  ?Psychiatric/Behavioral:  Negative for depression and suicidal ideas. The patient does not have insomnia.   ? ?Allergies  ?Allergen Reactions  ? Clindamycin/Lincomycin Anaphylaxis  ? Codeine Nausea Only  ? Erythromycin Anaphylaxis  ? Ivp Dye [Iodinated Contrast Media] Anaphylaxis  ? Metrizamide Anaphylaxis  ? Monosodium Glutamate Shortness Of Breath and Swelling  ?  Tongue swelling  ? Moxifloxacin Anaphylaxis  ? Penicillins Anaphylaxis and Rash  ?  .Has patient had a PCN reaction causing immediate rash, facial/tongue/throat swelling, SOB or lightheadedness with hypotension: Yes ?Has patient had a PCN reaction causing severe rash involving mucus membranes or skin necrosis: No ?Has patient had a PCN reaction that required hospitalization: Unknown ?Has patient had a PCN reaction occurring within the last 10 years: Yes ?If all of the above answers are "NO", then may proceed with Cephalosporin use. ?  ? Tramadol   ?  Patient discloses she "went crazy"  ? Amlodipine Swelling  ? Avelox [Moxifloxacin Hcl In Nacl]   ? Shellfish Allergy Hives  ?  seafood  ? Zoster Vaccine Live Rash  ? ? ? ?Past Medical History:  ?Diagnosis Date  ? Connective tissue disease ([place])   ?  DDD (degenerative disc disease), lumbar   ? Diverticulitis   ? Granuloma annulare   ? HTN (hypertension)   ? Osteoporosis   ? ? ? ?Past Surgical History:  ?Procedure Laterality Date  ? ABDOMINAL HYSTERECTOMY     ? BACK SURGERY    ? ? ?Social History  ? ?Socioeconomic History  ? Marital status: Married  ?  Spouse name: Not on file  ? Number of children: Not on file  ? Years of education: Not on file  ? Highest education level: Not on file  ?Occupational History  ? Not on file  ?Tobacco Use  ? Smoking status: Never  ? Smokeless tobacco: Never  ?Vaping Use  ? Vaping Use: Never used  ?Substance and Sexual Activity  ? Alcohol use: No  ? Drug use: No  ? Sexual activity: Not on file  ?Other Topics Concern  ? Not on file  ?Social History Narrative  ? Not on file  ? ?Social Determinants of Health  ? ?Financial Resource Strain: Not on file  ?Food Insecurity: Not on file  ?Transportation Needs: Not on file  ?Physical Activity: Not on file  ?Stress: Not on file  ?Social Connections: Not on file  ?Intimate Partner Violence: Not on file  ? ? ?Family History  ?Problem Relation Age of Onset  ? Ovarian cancer Mother   ? Breast cancer Sister   ? Leukemia Brother   ? Colon cancer Brother   ? ? ? ?Current Outpatient Medications:  ?  acetaminophen (TYLENOL) 325 MG tablet, Take 2 tablets (650 mg total) by mouth every 6 (six) hours as needed for mild pain (or Fever >/= 101)., Disp: , Rfl:  ?  carvedilol (COREG) 25 MG tablet, Take 25 mg by mouth 2 (two) times daily., Disp: , Rfl:  ?  cetirizine (ZYRTEC) 10 MG tablet, Take 10 mg by mouth daily., Disp: , Rfl:  ?  Cholecalciferol (VITAMIN D3) 1000 units CAPS, Take 1,000 Units by mouth daily., Disp: , Rfl:  ?  ferrous sulfate 325 (65 FE) MG tablet, Take 325 mg by mouth daily with breakfast., Disp: , Rfl:  ?  hydrALAZINE (APRESOLINE) 50 MG tablet, Take 1 tablet (50 mg total) by mouth 2 (two) times daily. (Patient taking differently: Take 50 mg by mouth See admin instructions. Take 1 tablet (18m) by mouth twice daily - may take 1 tablet (569m at lunchtime if needed), Disp: 30 tablet, Rfl: 0 ?  lisinopril (PRINIVIL,ZESTRIL) 40 MG tablet, Take 1 tablet (40 mg total) by mouth daily., Disp: 30 tablet,  Rfl: 0 ?  PREDNISONE PO, Take by mouth. WILL FINISH COURSE ON 08/05/21, Disp: , Rfl:  ?  vitamin B-12 (CYANOCOBALAMIN) 1000 MCG tablet, Take 1,000 mcg by mouth daily., Disp: , Rfl:  ?  Wheat Dextrin (BENEFIBER) POWD, Take 1 Dose by mouth daily., Disp: , Rfl:  ?  aspirin EC 81 MG EC tablet, Take 1 tablet (81 mg total) by mouth daily. (Patient not taking: Reported on 08/02/2021), Disp: 30 tablet, Rfl: 0 ?  chlorthalidone (HYGROTON) 25 MG tablet, Take 25 mg by mouth daily as needed. (Patient not taking: Reported on 08/02/2021), Disp: , Rfl:  ?  lactulose (CHRONULAC) 10 GM/15ML solution, Take 30 mLs (20 g total) by mouth daily as needed for mild constipation. (Patient not taking: Reported on 08/02/2021), Disp: 120 mL, Rfl: 0 ?  ondansetron (ZOFRAN ODT) 4 MG disintegrating tablet, Take 1 tablet (4 mg total) by mouth every 8 (eight) hours as needed for nausea or  vomiting. (Patient not taking: Reported on 08/02/2021), Disp: 20 tablet, Rfl: 0 ? ?Physical exam:  ?Vitals:  ? 08/02/21 1052  ?BP: (!) 163/80  ?Pulse: 66  ?Resp: 14  ?Temp: 97.9 ?F (36.6 ?C)  ?SpO2: 98%  ?Weight: 115 lb 12.8 oz (52.5 kg)  ? ?Physical Exam ?Constitutional:   ?   General: She is not in acute distress. ?Cardiovascular:  ?   Rate and Rhythm: Normal rate and regular rhythm.  ?   Heart sounds: Normal heart sounds.  ?Pulmonary:  ?   Effort: Pulmonary effort is normal.  ?   Breath sounds: Normal breath sounds.  ?Abdominal:  ?   General: Bowel sounds are normal.  ?   Palpations: Abdomen is soft.  ?Skin: ?   General: Skin is warm and dry.  ?Neurological:  ?   Mental Status: She is alert and oriented to person, place, and time.  ?  ? ? ?  Latest Ref Rng & Units 09/25/2020  ?  9:17 AM  ?CMP  ?Glucose 70 - 99 mg/dL 107    ?BUN 8 - 23 mg/dL 12    ?Creatinine 0.44 - 1.00 mg/dL 0.83    ?Sodium 135 - 145 mmol/L 137    ?Potassium 3.5 - 5.1 mmol/L 4.0    ?Chloride 98 - 111 mmol/L 99    ?CO2 22 - 32 mmol/L 30    ?Calcium 8.9 - 10.3 mg/dL 9.4    ?Total Protein 6.5 - 8.1 g/dL 8.1     ?Total Bilirubin 0.3 - 1.2 mg/dL 0.6    ?Alkaline Phos 38 - 126 U/L 46    ?AST 15 - 41 U/L 19    ?ALT 0 - 44 U/L 12    ? ? ?  Latest Ref Rng & Units 08/02/2021  ? 10:32 AM  ?CBC  ?WBC 4.0 - 10.5 K/uL 5

## 2021-08-03 LAB — KAPPA/LAMBDA LIGHT CHAINS
Kappa free light chain: 28.7 mg/L — ABNORMAL HIGH (ref 3.3–19.4)
Kappa, lambda light chain ratio: 1.49 (ref 0.26–1.65)
Lambda free light chains: 19.3 mg/L (ref 5.7–26.3)

## 2021-08-04 LAB — COPPER, SERUM: Copper: 74 ug/dL — ABNORMAL LOW (ref 80–158)

## 2021-08-05 ENCOUNTER — Encounter: Payer: Self-pay | Admitting: Oncology

## 2021-08-05 LAB — MULTIPLE MYELOMA PANEL, SERUM
Albumin SerPl Elph-Mcnc: 3 g/dL (ref 2.9–4.4)
Albumin/Glob SerPl: 0.7 (ref 0.7–1.7)
Alpha 1: 0.2 g/dL (ref 0.0–0.4)
Alpha2 Glob SerPl Elph-Mcnc: 0.6 g/dL (ref 0.4–1.0)
B-Globulin SerPl Elph-Mcnc: 3.5 g/dL — ABNORMAL HIGH (ref 0.7–1.3)
Gamma Glob SerPl Elph-Mcnc: 0.4 g/dL (ref 0.4–1.8)
Globulin, Total: 4.7 g/dL — ABNORMAL HIGH (ref 2.2–3.9)
IgA: 2569 mg/dL — ABNORMAL HIGH (ref 64–422)
IgG (Immunoglobin G), Serum: 1187 mg/dL (ref 586–1602)
IgM (Immunoglobulin M), Srm: 32 mg/dL (ref 26–217)
M Protein SerPl Elph-Mcnc: 2.6 g/dL — ABNORMAL HIGH
Total Protein ELP: 7.7 g/dL (ref 6.0–8.5)

## 2021-12-14 ENCOUNTER — Other Ambulatory Visit: Payer: Self-pay | Admitting: Orthopedic Surgery

## 2021-12-14 DIAGNOSIS — M4807 Spinal stenosis, lumbosacral region: Secondary | ICD-10-CM

## 2021-12-27 ENCOUNTER — Ambulatory Visit
Admission: RE | Admit: 2021-12-27 | Discharge: 2021-12-27 | Disposition: A | Discharge status: Home / Self Care | Payer: Medicare HMO | Source: Ambulatory Visit | Attending: Orthopedic Surgery | Admitting: Orthopedic Surgery

## 2021-12-27 DIAGNOSIS — M4807 Spinal stenosis, lumbosacral region: Secondary | ICD-10-CM

## 2022-01-19 NOTE — Telephone Encounter (Signed)
Signing encounter, see previous note 09/20/19

## 2022-02-12 ENCOUNTER — Inpatient Hospital Stay (HOSPITAL_COMMUNITY)
Admission: EM | Admit: 2022-02-12 | Discharge: 2022-03-05 | DRG: 493 | Disposition: A | Discharge status: Skilled Nursing Facility | Payer: Medicare HMO | Attending: Family Medicine | Admitting: Family Medicine

## 2022-02-12 ENCOUNTER — Emergency Department (HOSPITAL_COMMUNITY): Payer: Medicare HMO

## 2022-02-12 DIAGNOSIS — Z881 Allergy status to other antibiotic agents status: Secondary | ICD-10-CM

## 2022-02-12 DIAGNOSIS — M810 Age-related osteoporosis without current pathological fracture: Secondary | ICD-10-CM | POA: Diagnosis present

## 2022-02-12 DIAGNOSIS — Z885 Allergy status to narcotic agent status: Secondary | ICD-10-CM

## 2022-02-12 DIAGNOSIS — Z79899 Other long term (current) drug therapy: Secondary | ICD-10-CM

## 2022-02-12 DIAGNOSIS — Z803 Family history of malignant neoplasm of breast: Secondary | ICD-10-CM

## 2022-02-12 DIAGNOSIS — R079 Chest pain, unspecified: Secondary | ICD-10-CM

## 2022-02-12 DIAGNOSIS — Z7982 Long term (current) use of aspirin: Secondary | ICD-10-CM

## 2022-02-12 DIAGNOSIS — Z880 Allergy status to penicillin: Secondary | ICD-10-CM

## 2022-02-12 DIAGNOSIS — E871 Hypo-osmolality and hyponatremia: Secondary | ICD-10-CM | POA: Diagnosis not present

## 2022-02-12 DIAGNOSIS — S82141A Displaced bicondylar fracture of right tibia, initial encounter for closed fracture: Secondary | ICD-10-CM | POA: Diagnosis not present

## 2022-02-12 DIAGNOSIS — S82101A Unspecified fracture of upper end of right tibia, initial encounter for closed fracture: Secondary | ICD-10-CM

## 2022-02-12 DIAGNOSIS — Y92488 Other paved roadways as the place of occurrence of the external cause: Secondary | ICD-10-CM

## 2022-02-12 DIAGNOSIS — Z888 Allergy status to other drugs, medicaments and biological substances status: Secondary | ICD-10-CM

## 2022-02-12 DIAGNOSIS — Z800 Family history of malignant neoplasm of digestive organs: Secondary | ICD-10-CM

## 2022-02-12 DIAGNOSIS — Z90710 Acquired absence of both cervix and uterus: Secondary | ICD-10-CM

## 2022-02-12 DIAGNOSIS — Z91041 Radiographic dye allergy status: Secondary | ICD-10-CM

## 2022-02-12 DIAGNOSIS — R0789 Other chest pain: Secondary | ICD-10-CM | POA: Diagnosis present

## 2022-02-12 DIAGNOSIS — I700 Atherosclerosis of aorta: Secondary | ICD-10-CM | POA: Diagnosis present

## 2022-02-12 DIAGNOSIS — M5136 Other intervertebral disc degeneration, lumbar region: Secondary | ICD-10-CM | POA: Diagnosis present

## 2022-02-12 DIAGNOSIS — S82201A Unspecified fracture of shaft of right tibia, initial encounter for closed fracture: Secondary | ICD-10-CM

## 2022-02-12 DIAGNOSIS — E785 Hyperlipidemia, unspecified: Secondary | ICD-10-CM | POA: Diagnosis present

## 2022-02-12 DIAGNOSIS — Z7901 Long term (current) use of anticoagulants: Secondary | ICD-10-CM

## 2022-02-12 DIAGNOSIS — E876 Hypokalemia: Secondary | ICD-10-CM | POA: Diagnosis present

## 2022-02-12 DIAGNOSIS — Z8041 Family history of malignant neoplasm of ovary: Secondary | ICD-10-CM

## 2022-02-12 DIAGNOSIS — D62 Acute posthemorrhagic anemia: Secondary | ICD-10-CM | POA: Diagnosis not present

## 2022-02-12 DIAGNOSIS — M79661 Pain in right lower leg: Secondary | ICD-10-CM | POA: Diagnosis not present

## 2022-02-12 DIAGNOSIS — Z806 Family history of leukemia: Secondary | ICD-10-CM

## 2022-02-12 DIAGNOSIS — D649 Anemia, unspecified: Secondary | ICD-10-CM | POA: Insufficient documentation

## 2022-02-12 DIAGNOSIS — Z86718 Personal history of other venous thrombosis and embolism: Secondary | ICD-10-CM

## 2022-02-12 DIAGNOSIS — Z887 Allergy status to serum and vaccine status: Secondary | ICD-10-CM

## 2022-02-12 DIAGNOSIS — C9000 Multiple myeloma not having achieved remission: Secondary | ICD-10-CM | POA: Diagnosis present

## 2022-02-12 DIAGNOSIS — S82141S Displaced bicondylar fracture of right tibia, sequela: Secondary | ICD-10-CM

## 2022-02-12 DIAGNOSIS — Z91013 Allergy to seafood: Secondary | ICD-10-CM

## 2022-02-12 DIAGNOSIS — I10 Essential (primary) hypertension: Secondary | ICD-10-CM | POA: Diagnosis present

## 2022-02-12 HISTORY — DX: Prediabetes: R73.03

## 2022-02-12 LAB — I-STAT CHEM 8, ED
BUN: 11 mg/dL (ref 8–23)
Calcium, Ion: 1.01 mmol/L — ABNORMAL LOW (ref 1.15–1.40)
Chloride: 103 mmol/L (ref 98–111)
Creatinine, Ser: 0.8 mg/dL (ref 0.44–1.00)
Glucose, Bld: 154 mg/dL — ABNORMAL HIGH (ref 70–99)
HCT: 37 % (ref 36.0–46.0)
Hemoglobin: 12.6 g/dL (ref 12.0–15.0)
Potassium: 3.8 mmol/L (ref 3.5–5.1)
Sodium: 137 mmol/L (ref 135–145)
TCO2: 24 mmol/L (ref 22–32)

## 2022-02-12 LAB — COMPREHENSIVE METABOLIC PANEL
ALT: 15 U/L (ref 0–44)
AST: 21 U/L (ref 15–41)
Albumin: 3.1 g/dL — ABNORMAL LOW (ref 3.5–5.0)
Alkaline Phosphatase: 48 U/L (ref 38–126)
Anion gap: 13 (ref 5–15)
BUN: 11 mg/dL (ref 8–23)
CO2: 25 mmol/L (ref 22–32)
Calcium: 9.5 mg/dL (ref 8.9–10.3)
Chloride: 101 mmol/L (ref 98–111)
Creatinine, Ser: 0.78 mg/dL (ref 0.44–1.00)
GFR, Estimated: >60 mL/min (ref >60)
Glucose, Bld: 154 mg/dL — ABNORMAL HIGH (ref 70–99)
Potassium: 3.9 mmol/L (ref 3.5–5.1)
Sodium: 139 mmol/L (ref 135–145)
Total Bilirubin: 0.5 mg/dL (ref 0.3–1.2)
Total Protein: 8.7 g/dL — ABNORMAL HIGH (ref 6.5–8.1)

## 2022-02-12 LAB — URINALYSIS, ROUTINE W REFLEX MICROSCOPIC
Bilirubin Urine: NEGATIVE
Glucose, UA: NEGATIVE mg/dL
Ketones, ur: NEGATIVE mg/dL
Leukocytes,Ua: NEGATIVE
Nitrite: NEGATIVE
Protein, ur: 100 mg/dL — AB
Specific Gravity, Urine: 1.005 (ref 1.005–1.030)
pH: 8 (ref 5.0–8.0)

## 2022-02-12 LAB — CBC
HCT: 37.7 % (ref 36.0–46.0)
Hemoglobin: 12.5 g/dL (ref 12.0–15.0)
MCH: 32.2 pg (ref 26.0–34.0)
MCHC: 33.2 g/dL (ref 30.0–36.0)
MCV: 97.2 fL (ref 80.0–100.0)
Platelets: 171 10*3/uL (ref 150–400)
RBC: 3.88 MIL/uL (ref 3.87–5.11)
RDW: 14.9 % (ref 11.5–15.5)
WBC: 3.7 10*3/uL — ABNORMAL LOW (ref 4.0–10.5)
nRBC: 0 % (ref 0.0–0.2)

## 2022-02-12 LAB — LACTIC ACID, PLASMA: Lactic Acid, Venous: 1.2 mmol/L (ref 0.5–1.9)

## 2022-02-12 LAB — SAMPLE TO BLOOD BANK

## 2022-02-12 LAB — ETHANOL: Alcohol, Ethyl (B): <10 mg/dL (ref <10)

## 2022-02-12 LAB — PROTIME-INR
INR: 1.1 (ref 0.8–1.2)
Prothrombin Time: 14.2 seconds (ref 11.4–15.2)

## 2022-02-12 LAB — TROPONIN I (HIGH SENSITIVITY): Troponin I (High Sensitivity): 67 ng/L — ABNORMAL HIGH (ref <18)

## 2022-02-12 MED ORDER — OXYCODONE HCL 5 MG PO TABS
2.5000 mg | ORAL_TABLET | ORAL | Status: DC | PRN
  Administered 2022-02-12 – 2022-02-15 (×7): 2.5 mg via ORAL
  Filled 2022-02-12 (×7): qty 1

## 2022-02-12 MED ORDER — CARVEDILOL 25 MG PO TABS
25.0000 mg | ORAL_TABLET | Freq: Two times a day (BID) | ORAL | Status: DC
  Administered 2022-02-12 – 2022-03-05 (×41): 25 mg via ORAL
  Filled 2022-02-12 (×41): qty 1

## 2022-02-12 MED ORDER — MELATONIN 3 MG PO TABS
3.0000 mg | ORAL_TABLET | Freq: Every day | ORAL | Status: DC
  Administered 2022-02-12 – 2022-03-04 (×20): 3 mg via ORAL
  Filled 2022-02-12 (×21): qty 1

## 2022-02-12 MED ORDER — PROCHLORPERAZINE MALEATE 5 MG PO TABS
5.0000 mg | ORAL_TABLET | Freq: Four times a day (QID) | ORAL | Status: DC | PRN
  Administered 2022-02-12 – 2022-02-23 (×8): 5 mg via ORAL
  Filled 2022-02-12 (×10): qty 1

## 2022-02-12 MED ORDER — ACETAMINOPHEN 325 MG PO TABS
650.0000 mg | ORAL_TABLET | Freq: Four times a day (QID) | ORAL | Status: DC
  Administered 2022-02-12 – 2022-02-14 (×7): 650 mg via ORAL
  Filled 2022-02-12 (×7): qty 2

## 2022-02-12 MED ORDER — HYDRALAZINE HCL 50 MG PO TABS
50.0000 mg | ORAL_TABLET | Freq: Two times a day (BID) | ORAL | Status: DC
  Administered 2022-02-12 – 2022-02-18 (×12): 50 mg via ORAL
  Filled 2022-02-12 (×11): qty 1

## 2022-02-12 MED ORDER — FENTANYL CITRATE PF 50 MCG/ML IJ SOSY
50.0000 ug | PREFILLED_SYRINGE | INTRAMUSCULAR | Status: DC | PRN
  Administered 2022-02-12: 50 ug via INTRAVENOUS
  Filled 2022-02-12 (×2): qty 1

## 2022-02-12 MED ORDER — LISINOPRIL 20 MG PO TABS
40.0000 mg | ORAL_TABLET | Freq: Every day | ORAL | Status: DC
  Administered 2022-02-12: 40 mg via ORAL
  Filled 2022-02-12 (×2): qty 2

## 2022-02-12 MED ORDER — ONDANSETRON HCL 4 MG/2ML IJ SOLN
4.0000 mg | Freq: Once | INTRAMUSCULAR | Status: AC
  Administered 2022-02-12: 4 mg via INTRAVENOUS
  Filled 2022-02-12: qty 2

## 2022-02-12 MED ORDER — ENOXAPARIN SODIUM 40 MG/0.4ML IJ SOSY
40.0000 mg | PREFILLED_SYRINGE | INTRAMUSCULAR | Status: DC
  Administered 2022-02-12 – 2022-02-19 (×8): 40 mg via SUBCUTANEOUS
  Filled 2022-02-12 (×8): qty 0.4

## 2022-02-12 MED ORDER — SIMVASTATIN 5 MG PO TABS
5.0000 mg | ORAL_TABLET | Freq: Every day | ORAL | Status: DC
  Administered 2022-02-12 – 2022-03-04 (×20): 5 mg via ORAL
  Filled 2022-02-12 (×22): qty 1
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
## 2022-02-12 NOTE — Assessment & Plan Note (Addendum)
Patient is doing well and her pain has improved. Is able to ambulate with walker and assistance. Stitches are intact  - SNF - [name], bed will be ready tomorrow - DVT ppx with Eliquis 2.5 mg twice daily (for 30 days post-op, last dose 03/24/22) - Tylenol q6h sch - Miralax and senna daily - Incentive Spirometry

## 2022-02-12 NOTE — Progress Notes (Addendum)
FMTS Brief Progress Note  S:Patient doing well, reports some discomfort and pain in her leg. Patient request some benadryl to help her sleep, per her normal routine.    O: BP (!) 140/82 (BP Location: Right Arm)   Pulse 81   Temp 97.8 F (36.6 C) (Oral)   Resp 15   SpO2 98%   Gen: NAD, pleasant, cooperative/interactive Right leg: immobilized in brace, posterior tibialis pulse present  A/P: Right Tibial fracture - Orders reviewed. Labs for AM ordered, which was adjusted as needed.  - Ortho following, appreciate recs - Oxycodone 2.5 q4h prn - Tyl 650 q6h - Melatonin 3 mg for sleep  Holley Bouche, MD 02/12/2022, 9:00 PM PGY-2, [place] Night Resident  Please page [phone] with questions.
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
## 2022-02-12 NOTE — Progress Notes (Signed)
Orthopedic Tech Progress Note Patient Details:  Janet Duffy Jun 02, 1931 [id number]  Ace wrap and knee immobilizer applied to RLE.   Ortho Devices Type of Ortho Device: Knee Immobilizer Ortho Device/Splint Location: RLE Ortho Device/Splint Interventions: Ordered, Application, Adjustment   Post Interventions Patient Tolerated: Well Instructions Provided: Care of device  Janet Duffy [name] 02/12/2022, 2:23 PM
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
## 2022-02-12 NOTE — Assessment & Plan Note (Addendum)
Stable - Cont Chlorthalidone 12.'5mg'$  daily, increase to 25 if SBP consistently > 160 - Continue Coreg 25 mg BID - Continue Lisinopril '40mg'$  daily - Hold home Hydral and discontinue at discharge

## 2022-02-13 ENCOUNTER — Other Ambulatory Visit: Payer: Self-pay

## 2022-02-13 ENCOUNTER — Encounter (HOSPITAL_COMMUNITY): Payer: Self-pay | Admitting: Family Medicine

## 2022-02-13 DIAGNOSIS — D649 Anemia, unspecified: Secondary | ICD-10-CM | POA: Diagnosis not present

## 2022-02-13 DIAGNOSIS — Z7982 Long term (current) use of aspirin: Secondary | ICD-10-CM | POA: Diagnosis not present

## 2022-02-13 DIAGNOSIS — S82101D Unspecified fracture of upper end of right tibia, subsequent encounter for closed fracture with routine healing: Secondary | ICD-10-CM | POA: Diagnosis not present

## 2022-02-13 DIAGNOSIS — I159 Secondary hypertension, unspecified: Secondary | ICD-10-CM | POA: Diagnosis not present

## 2022-02-13 DIAGNOSIS — E785 Hyperlipidemia, unspecified: Secondary | ICD-10-CM | POA: Diagnosis present

## 2022-02-13 DIAGNOSIS — E871 Hypo-osmolality and hyponatremia: Secondary | ICD-10-CM | POA: Diagnosis not present

## 2022-02-13 DIAGNOSIS — S82141S Displaced bicondylar fracture of right tibia, sequela: Secondary | ICD-10-CM | POA: Diagnosis not present

## 2022-02-13 DIAGNOSIS — Z880 Allergy status to penicillin: Secondary | ICD-10-CM | POA: Diagnosis not present

## 2022-02-13 DIAGNOSIS — Z803 Family history of malignant neoplasm of breast: Secondary | ICD-10-CM | POA: Diagnosis not present

## 2022-02-13 DIAGNOSIS — Z887 Allergy status to serum and vaccine status: Secondary | ICD-10-CM | POA: Diagnosis not present

## 2022-02-13 DIAGNOSIS — I700 Atherosclerosis of aorta: Secondary | ICD-10-CM | POA: Diagnosis present

## 2022-02-13 DIAGNOSIS — Z806 Family history of leukemia: Secondary | ICD-10-CM | POA: Diagnosis not present

## 2022-02-13 DIAGNOSIS — M810 Age-related osteoporosis without current pathological fracture: Secondary | ICD-10-CM | POA: Diagnosis present

## 2022-02-13 DIAGNOSIS — M1990 Unspecified osteoarthritis, unspecified site: Secondary | ICD-10-CM | POA: Diagnosis not present

## 2022-02-13 DIAGNOSIS — I10 Essential (primary) hypertension: Secondary | ICD-10-CM | POA: Diagnosis present

## 2022-02-13 DIAGNOSIS — Z86718 Personal history of other venous thrombosis and embolism: Secondary | ICD-10-CM | POA: Diagnosis not present

## 2022-02-13 DIAGNOSIS — Z91041 Radiographic dye allergy status: Secondary | ICD-10-CM | POA: Diagnosis not present

## 2022-02-13 DIAGNOSIS — Z885 Allergy status to narcotic agent status: Secondary | ICD-10-CM | POA: Diagnosis not present

## 2022-02-13 DIAGNOSIS — E876 Hypokalemia: Secondary | ICD-10-CM | POA: Diagnosis not present

## 2022-02-13 DIAGNOSIS — Z7901 Long term (current) use of anticoagulants: Secondary | ICD-10-CM | POA: Diagnosis not present

## 2022-02-13 DIAGNOSIS — S82101A Unspecified fracture of upper end of right tibia, initial encounter for closed fracture: Secondary | ICD-10-CM | POA: Diagnosis not present

## 2022-02-13 DIAGNOSIS — Z79899 Other long term (current) drug therapy: Secondary | ICD-10-CM | POA: Diagnosis not present

## 2022-02-13 DIAGNOSIS — Z8041 Family history of malignant neoplasm of ovary: Secondary | ICD-10-CM | POA: Diagnosis not present

## 2022-02-13 DIAGNOSIS — Z800 Family history of malignant neoplasm of digestive organs: Secondary | ICD-10-CM | POA: Diagnosis not present

## 2022-02-13 DIAGNOSIS — Z881 Allergy status to other antibiotic agents status: Secondary | ICD-10-CM | POA: Diagnosis not present

## 2022-02-13 DIAGNOSIS — M79661 Pain in right lower leg: Secondary | ICD-10-CM | POA: Diagnosis present

## 2022-02-13 DIAGNOSIS — S82141D Displaced bicondylar fracture of right tibia, subsequent encounter for closed fracture with routine healing: Secondary | ICD-10-CM | POA: Diagnosis not present

## 2022-02-13 DIAGNOSIS — S82141A Displaced bicondylar fracture of right tibia, initial encounter for closed fracture: Secondary | ICD-10-CM | POA: Diagnosis present

## 2022-02-13 DIAGNOSIS — Y92488 Other paved roadways as the place of occurrence of the external cause: Secondary | ICD-10-CM | POA: Diagnosis not present

## 2022-02-13 DIAGNOSIS — Z90710 Acquired absence of both cervix and uterus: Secondary | ICD-10-CM | POA: Diagnosis not present

## 2022-02-13 DIAGNOSIS — Z888 Allergy status to other drugs, medicaments and biological substances status: Secondary | ICD-10-CM | POA: Diagnosis not present

## 2022-02-13 DIAGNOSIS — C9000 Multiple myeloma not having achieved remission: Secondary | ICD-10-CM | POA: Diagnosis present

## 2022-02-13 DIAGNOSIS — D62 Acute posthemorrhagic anemia: Secondary | ICD-10-CM | POA: Diagnosis not present

## 2022-02-13 LAB — COMPREHENSIVE METABOLIC PANEL
ALT: 16 U/L (ref 0–44)
AST: 18 U/L (ref 15–41)
Albumin: 3 g/dL — ABNORMAL LOW (ref 3.5–5.0)
Alkaline Phosphatase: 38 U/L (ref 38–126)
Anion gap: 10 (ref 5–15)
BUN: 17 mg/dL (ref 8–23)
CO2: 26 mmol/L (ref 22–32)
Calcium: 9.4 mg/dL (ref 8.9–10.3)
Chloride: 100 mmol/L (ref 98–111)
Creatinine, Ser: 0.94 mg/dL (ref 0.44–1.00)
GFR, Estimated: 58 mL/min — ABNORMAL LOW (ref >60)
Glucose, Bld: 145 mg/dL — ABNORMAL HIGH (ref 70–99)
Potassium: 3.9 mmol/L (ref 3.5–5.1)
Sodium: 136 mmol/L (ref 135–145)
Total Bilirubin: 0.5 mg/dL (ref 0.3–1.2)
Total Protein: 8.3 g/dL — ABNORMAL HIGH (ref 6.5–8.1)

## 2022-02-13 LAB — CBC
HCT: 32.1 % — ABNORMAL LOW (ref 36.0–46.0)
Hemoglobin: 10.6 g/dL — ABNORMAL LOW (ref 12.0–15.0)
MCH: 31.6 pg (ref 26.0–34.0)
MCHC: 33 g/dL (ref 30.0–36.0)
MCV: 95.8 fL (ref 80.0–100.0)
Platelets: 158 10*3/uL (ref 150–400)
RBC: 3.35 MIL/uL — ABNORMAL LOW (ref 3.87–5.11)
RDW: 14.7 % (ref 11.5–15.5)
WBC: 7.6 10*3/uL (ref 4.0–10.5)
nRBC: 0 % (ref 0.0–0.2)

## 2022-02-13 LAB — TROPONIN I (HIGH SENSITIVITY): Troponin I (High Sensitivity): 54 ng/L — ABNORMAL HIGH (ref <18)

## 2022-02-13 LAB — SURGICAL PCR SCREEN
MRSA, PCR: NEGATIVE
Staphylococcus aureus: NEGATIVE

## 2022-02-13 MED ORDER — LISINOPRIL 20 MG PO TABS
40.0000 mg | ORAL_TABLET | Freq: Every day | ORAL | Status: DC
  Administered 2022-02-13 – 2022-02-16 (×4): 40 mg via ORAL
  Filled 2022-02-13 (×4): qty 2

## 2022-02-13 MED ORDER — ONDANSETRON HCL 4 MG/2ML IJ SOLN
4.0000 mg | Freq: Once | INTRAMUSCULAR | Status: DC
  Filled 2022-02-13: qty 2

## 2022-02-13 NOTE — Therapy (Signed)
OT Cancellation Note  Patient Details Name: Elynor Kallenberger MRN: [id number] DOB: 1931/12/17   Cancelled Treatment:      Reason Eval/Treat Not Completed: Medical issues which prohibited therapy at this time. Pt awaiting surgical intervention for R tibial plateau fx on Monday 11/13. At the direction of medical team, PT will follow and evaluate after surgery.   Naomie Dean Joclyn Alsobrook 02/13/2022, 10:21 AM

## 2022-02-13 NOTE — Progress Notes (Signed)
   Subjective:    Recheck right knee/leg s/p fall Pt with known tibial plateau fracture with possible surgery early this week Pt resting comfortably easily aroused Pain is mild to moderate this morning Denies any new symptoms over night  Patient reports pain as moderate.  Objective:   VITALS:   Vitals:   02/12/22 2052 02/13/22 0445  BP: (!) 140/82 128/72  Pulse: 81 89  Resp: 15 11  Temp: 97.8 F (36.6 C) 98 F (36.7 C)  SpO2: 98% (!) 89%    Right knee: currently in knee immboiliizer Nv intact distally No rashes or edema distally  Not taken thru any rom  LABS Recent Labs    02/12/22 1130 02/12/22 1144 02/13/22 0216  HGB 12.5 12.6 10.6*  HCT 37.7 37.0 32.1*  WBC 3.7*  --  7.6  PLT 171  --  158    Recent Labs    02/12/22 1130 02/12/22 1144 02/13/22 0216  NA 139 137 136  K 3.9 3.8 3.9  BUN '11 11 17  '$ CREATININE 0.78 0.80 0.94  GLUCOSE 154* 154* 145*     Assessment/Plan:   Right tibial plateau fracture Plan for surgery with the orthopedic trauma team possibly as early as tomorrow NPO after midnight Continue pain management Strict non weight bearing right lower extremity      Brad Luna [place], [place] is now [employer] [facility] [address], [place], [place], [place] [address] Phone: [phone] [email or web address] Facebook  [employer]

## 2022-02-13 NOTE — TOC CAGE-AID Note (Signed)
Transition of Care [facility]) - CAGE-AID Screening   Patient Details  Name: Janet Duffy MRN: [id number] Date of Birth: September 15, 1931  Clinical Narrative:  Patient denies any alcohol or substance abuse, denies need for resources at this time.  CAGE-AID Screening:    Have You Ever Felt You Ought to Cut Down on Your Drinking or Drug Use?: No Have People Annoyed You By Critizing Your Drinking Or Drug Use?: No Have You Felt Bad Or Guilty About Your Drinking Or Drug Use?: No Have You Ever Had a Drink or Used Drugs First Thing In The Morning to Steady Your Nerves or to Get Rid of a Hangover?: No CAGE-AID Score: 0  Substance Abuse Education Offered: No

## 2022-02-13 NOTE — Progress Notes (Signed)
Consult request received for comminuted right tibial plateau fracture. Have discussed with the requesting MD patient's stability, pain control, and ongoing work up. I have reviewed x-rays and developed a provisional plan.   Full consultation to follow in the am. Dr. Doreatha Martin or I will assume care tomorrow with plan for operative treatment, which may need to be staged based on soft tissue condition.   Altamese Ralston, MD [facility], [facility] [phone]

## 2022-02-13 NOTE — Progress Notes (Signed)
PT Cancellation Note  Patient Details Name: Janet Duffy MRN: [id number] DOB: [birth year]   Cancelled Treatment:    Reason Eval/Treat Not Completed: Medical issues which prohibited therapy at this time. Pt awaiting surgical intervention for R tibial plateau fx on Monday 11/13. At the direction of medical team, PT will follow and evaluate after surgery.   [place] Car[place], PT, DPT   Acute Rehabilitation Department   [name] 02/13/2022, 8:06 AM
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
## 2022-02-14 ENCOUNTER — Encounter (HOSPITAL_COMMUNITY)
Admission: EM | Disposition: A | Discharge status: Skilled Nursing Facility | Payer: Self-pay | Source: Home / Self Care | Attending: Family Medicine

## 2022-02-14 ENCOUNTER — Other Ambulatory Visit: Payer: Self-pay

## 2022-02-14 ENCOUNTER — Encounter (HOSPITAL_COMMUNITY): Payer: Self-pay | Admitting: Family Medicine

## 2022-02-14 ENCOUNTER — Inpatient Hospital Stay (HOSPITAL_COMMUNITY): Payer: Medicare HMO

## 2022-02-14 ENCOUNTER — Inpatient Hospital Stay (HOSPITAL_COMMUNITY): Payer: Medicare HMO | Admitting: Certified Registered Nurse Anesthetist

## 2022-02-14 DIAGNOSIS — S82101A Unspecified fracture of upper end of right tibia, initial encounter for closed fracture: Secondary | ICD-10-CM | POA: Diagnosis not present

## 2022-02-14 DIAGNOSIS — S82141A Displaced bicondylar fracture of right tibia, initial encounter for closed fracture: Secondary | ICD-10-CM

## 2022-02-14 DIAGNOSIS — I10 Essential (primary) hypertension: Secondary | ICD-10-CM | POA: Diagnosis not present

## 2022-02-14 HISTORY — PX: ORIF TIBIA FRACTURE: SHX5416

## 2022-02-14 LAB — ABO/RH: ABO/RH(D): O POS

## 2022-02-14 LAB — CBC
HCT: 25.4 % — ABNORMAL LOW (ref 36.0–46.0)
Hemoglobin: 8.4 g/dL — ABNORMAL LOW (ref 12.0–15.0)
MCH: 31.6 pg (ref 26.0–34.0)
MCHC: 33.1 g/dL (ref 30.0–36.0)
MCV: 95.5 fL (ref 80.0–100.0)
Platelets: 129 10*3/uL — ABNORMAL LOW (ref 150–400)
RBC: 2.66 MIL/uL — ABNORMAL LOW (ref 3.87–5.11)
RDW: 14.7 % (ref 11.5–15.5)
WBC: 9.6 10*3/uL (ref 4.0–10.5)
nRBC: 0 % (ref 0.0–0.2)

## 2022-02-14 LAB — TYPE AND SCREEN
ABO/RH(D): O POS
Antibody Screen: NEGATIVE

## 2022-02-14 SURGERY — OPEN REDUCTION INTERNAL FIXATION (ORIF) TIBIA FRACTURE
Anesthesia: General | Laterality: Right

## 2022-02-14 MED ORDER — SUCCINYLCHOLINE CHLORIDE 200 MG/10ML IV SOSY
PREFILLED_SYRINGE | INTRAVENOUS | Status: DC | PRN
  Administered 2022-02-14: 120 mg via INTRAVENOUS

## 2022-02-14 MED ORDER — DEXAMETHASONE SODIUM PHOSPHATE 10 MG/ML IJ SOLN
INTRAMUSCULAR | Status: DC | PRN
  Administered 2022-02-14: 5 mg via INTRAVENOUS

## 2022-02-14 MED ORDER — PROPOFOL 10 MG/ML IV BOLUS
INTRAVENOUS | Status: DC | PRN
  Administered 2022-02-14: 90 mg via INTRAVENOUS

## 2022-02-14 MED ORDER — CHLORHEXIDINE GLUCONATE 0.12 % MT SOLN: 15.0000 mL | Freq: Once | OROMUCOSAL | Status: AC

## 2022-02-14 MED ORDER — CHLORHEXIDINE GLUCONATE 0.12 % MT SOLN
OROMUCOSAL | Status: AC
  Administered 2022-02-14: 15 mL via OROMUCOSAL
  Filled 2022-02-14: qty 15

## 2022-02-14 MED ORDER — CARVEDILOL 12.5 MG PO TABS
ORAL_TABLET | ORAL | Status: AC
  Filled 2022-02-14: qty 2

## 2022-02-14 MED ORDER — ROCURONIUM BROMIDE 10 MG/ML (PF) SYRINGE
PREFILLED_SYRINGE | INTRAVENOUS | Status: DC | PRN
  Administered 2022-02-14: 40 mg via INTRAVENOUS

## 2022-02-14 MED ORDER — SUGAMMADEX SODIUM 200 MG/2ML IV SOLN
INTRAVENOUS | Status: DC | PRN
  Administered 2022-02-14: 125 mg via INTRAVENOUS

## 2022-02-14 MED ORDER — FENTANYL CITRATE (PF) 250 MCG/5ML IJ SOLN
INTRAMUSCULAR | Status: DC | PRN
  Administered 2022-02-14: 100 ug via INTRAVENOUS

## 2022-02-14 MED ORDER — TIZANIDINE HCL 4 MG PO TABS
2.0000 mg | ORAL_TABLET | Freq: Three times a day (TID) | ORAL | Status: DC | PRN
  Administered 2022-02-15 – 2022-02-21 (×4): 2 mg via ORAL
  Filled 2022-02-14 (×6): qty 1

## 2022-02-14 MED ORDER — FENTANYL CITRATE (PF) 250 MCG/5ML IJ SOLN
INTRAMUSCULAR | Status: AC
  Filled 2022-02-14: qty 5

## 2022-02-14 MED ORDER — ORAL CARE MOUTH RINSE: 15.0000 mL | Freq: Once | OROMUCOSAL | Status: AC

## 2022-02-14 MED ORDER — FENTANYL CITRATE (PF) 100 MCG/2ML IJ SOLN
INTRAMUSCULAR | Status: AC
  Filled 2022-02-14: qty 2

## 2022-02-14 MED ORDER — ACETAMINOPHEN 500 MG PO TABS
1000.0000 mg | ORAL_TABLET | Freq: Four times a day (QID) | ORAL | Status: DC
  Administered 2022-02-14 – 2022-03-05 (×67): 1000 mg via ORAL
  Filled 2022-02-14 (×68): qty 2

## 2022-02-14 MED ORDER — PHENYLEPHRINE 80 MCG/ML (10ML) SYRINGE FOR IV PUSH (FOR BLOOD PRESSURE SUPPORT)
PREFILLED_SYRINGE | INTRAVENOUS | Status: DC | PRN
  Administered 2022-02-14: 240 ug via INTRAVENOUS
  Administered 2022-02-14: 80 ug via INTRAVENOUS
  Administered 2022-02-14: 320 ug via INTRAVENOUS
  Administered 2022-02-14: 160 ug via INTRAVENOUS

## 2022-02-14 MED ORDER — PROPOFOL 500 MG/50ML IV EMUL
INTRAVENOUS | Status: DC | PRN
  Administered 2022-02-14: 125 ug/kg/min via INTRAVENOUS

## 2022-02-14 MED ORDER — LIDOCAINE 2% (20 MG/ML) 5 ML SYRINGE
INTRAMUSCULAR | Status: DC | PRN
  Administered 2022-02-14: 80 mg via INTRAVENOUS

## 2022-02-14 MED ORDER — ONDANSETRON HCL 4 MG/2ML IJ SOLN
INTRAMUSCULAR | Status: DC | PRN
  Administered 2022-02-14: 4 mg via INTRAVENOUS

## 2022-02-14 MED ORDER — LACTATED RINGERS IV SOLN: INTRAVENOUS | Status: DC | PRN

## 2022-02-14 MED ORDER — PHENYLEPHRINE HCL-NACL 20-0.9 MG/250ML-% IV SOLN
INTRAVENOUS | Status: DC | PRN
  Administered 2022-02-14: 50 ug/min via INTRAVENOUS

## 2022-02-14 MED ORDER — CEFAZOLIN SODIUM-DEXTROSE 2-4 GM/100ML-% IV SOLN
2.0000 g | Freq: Three times a day (TID) | INTRAVENOUS | Status: AC
  Administered 2022-02-14 – 2022-02-15 (×3): 2 g via INTRAVENOUS
  Filled 2022-02-14 (×3): qty 100

## 2022-02-14 MED ORDER — LACTATED RINGERS IV SOLN: INTRAVENOUS | Status: DC

## 2022-02-14 MED ORDER — FENTANYL CITRATE (PF) 100 MCG/2ML IJ SOLN
25.0000 ug | INTRAMUSCULAR | Status: DC | PRN
  Administered 2022-02-14: 50 ug via INTRAVENOUS

## 2022-02-14 MED ORDER — CEFAZOLIN SODIUM-DEXTROSE 2-3 GM-%(50ML) IV SOLR
INTRAVENOUS | Status: DC | PRN
  Administered 2022-02-14: 2 g via INTRAVENOUS

## 2022-02-14 MED ORDER — EPHEDRINE SULFATE-NACL 50-0.9 MG/10ML-% IV SOSY
PREFILLED_SYRINGE | INTRAVENOUS | Status: DC | PRN
  Administered 2022-02-14: 10 mg via INTRAVENOUS

## 2022-02-14 SURGICAL SUPPLY — 60 items
BAG COUNTER SPONGE SURGICOUNT (BAG) ×1 IMPLANT
BAR GLASS FIBER EXFX 11X500 (EXFIX) IMPLANT
BNDG COHESIVE 4X5 TAN STRL (GAUZE/BANDAGES/DRESSINGS) IMPLANT
BNDG ELASTIC 4X5.8 VLCR STR LF (GAUZE/BANDAGES/DRESSINGS) ×1 IMPLANT
BNDG ELASTIC 6X5.8 VLCR STR LF (GAUZE/BANDAGES/DRESSINGS) ×1 IMPLANT
BNDG GAUZE DERMACEA FLUFF 4 (GAUZE/BANDAGES/DRESSINGS) ×1 IMPLANT
BRUSH SCRUB EZ PLAIN DRY (MISCELLANEOUS) ×2 IMPLANT
COVER MAYO STAND STRL (DRAPES) ×1 IMPLANT
DRAPE C-ARM 42X72 X-RAY (DRAPES) ×1 IMPLANT
DRAPE C-ARMOR (DRAPES) ×1 IMPLANT
DRAPE HALF SHEET 40X57 (DRAPES) ×2 IMPLANT
DRAPE INCISE IOBAN 66X45 STRL (DRAPES) ×1 IMPLANT
DRAPE U-SHAPE 47X51 STRL (DRAPES) ×1 IMPLANT
DRSG ADAPTIC 3X8 NADH LF (GAUZE/BANDAGES/DRESSINGS) ×1 IMPLANT
DRSG MEPITEL 8X12 (GAUZE/BANDAGES/DRESSINGS) IMPLANT
ELECT REM PT RETURN 9FT ADLT (ELECTROSURGICAL) ×1
ELECTRODE REM PT RTRN 9FT ADLT (ELECTROSURGICAL) ×1 IMPLANT
GAUZE PAD ABD 8X10 STRL (GAUZE/BANDAGES/DRESSINGS) ×4 IMPLANT
GAUZE SPONGE 4X4 12PLY STRL (GAUZE/BANDAGES/DRESSINGS) ×1 IMPLANT
GLOVE BIO SURGEON STRL SZ7.5 (GLOVE) ×1 IMPLANT
GLOVE BIO SURGEON STRL SZ8 (GLOVE) ×1 IMPLANT
GLOVE BIOGEL PI IND STRL 7.5 (GLOVE) ×1 IMPLANT
GLOVE BIOGEL PI IND STRL 8 (GLOVE) ×1 IMPLANT
GLOVE SURG ORTHO LTX SZ7.5 (GLOVE) ×2 IMPLANT
GLOVE XGUARD RR 2 7.5 (GLOVE) ×1 IMPLANT
GLOVE XGUARD RR2 7.5 (GLOVE) ×1
GOWN STRL REUS W/ TWL LRG LVL3 (GOWN DISPOSABLE) ×2 IMPLANT
GOWN STRL REUS W/ TWL XL LVL3 (GOWN DISPOSABLE) ×1 IMPLANT
GOWN STRL REUS W/TWL LRG LVL3 (GOWN DISPOSABLE) ×2
GOWN STRL REUS W/TWL XL LVL3 (GOWN DISPOSABLE) ×1
HALF PIN 5.0X160 (EXFIX) IMPLANT
KIT BASIN OR (CUSTOM PROCEDURE TRAY) ×1 IMPLANT
KIT TURNOVER KIT B (KITS) ×1 IMPLANT
MANIFOLD NEPTUNE II (INSTRUMENTS) ×1 IMPLANT
NS IRRIG 1000ML POUR BTL (IV SOLUTION) ×1 IMPLANT
PACK ORTHO EXTREMITY (CUSTOM PROCEDURE TRAY) ×1 IMPLANT
PAD ARMBOARD 7.5X6 YLW CONV (MISCELLANEOUS) ×2 IMPLANT
PAD CAST 4YDX4 CTTN HI CHSV (CAST SUPPLIES) ×1 IMPLANT
PADDING CAST COTTON 4X4 STRL (CAST SUPPLIES) ×1
PADDING CAST COTTON 6X4 STRL (CAST SUPPLIES) ×1 IMPLANT
PIN CLAMP 2BAR 75MM BLUE (EXFIX) IMPLANT
PIN HALF YELLOW 5X160X35 (EXFIX) IMPLANT
SPONGE T-LAP 18X18 ~~LOC~~+RFID (SPONGE) IMPLANT
STAPLER VISISTAT 35W (STAPLE) ×1 IMPLANT
STOCKINETTE IMPERVIOUS LG (DRAPES) IMPLANT
SUCTION FRAZIER HANDLE 10FR (MISCELLANEOUS) ×1
SUCTION TUBE FRAZIER 10FR DISP (MISCELLANEOUS) ×1 IMPLANT
SUT ETHILON 2 0 FS 18 (SUTURE) IMPLANT
SUT VIC AB 0 CT1 27 (SUTURE)
SUT VIC AB 0 CT1 27XBRD ANBCTR (SUTURE) ×1 IMPLANT
SUT VIC AB 1 CT1 27 (SUTURE)
SUT VIC AB 1 CT1 27XBRD ANBCTR (SUTURE) ×1 IMPLANT
SUT VIC AB 2-0 CT1 27 (SUTURE)
SUT VIC AB 2-0 CT1 TAPERPNT 27 (SUTURE) ×2 IMPLANT
TOWEL GREEN STERILE (TOWEL DISPOSABLE) ×2 IMPLANT
TOWEL GREEN STERILE FF (TOWEL DISPOSABLE) ×1 IMPLANT
TRAY FOLEY MTR SLVR 16FR STAT (SET/KITS/TRAYS/PACK) IMPLANT
TUBE CONNECTING 12X1/4 (SUCTIONS) ×1 IMPLANT
WATER STERILE IRR 1000ML POUR (IV SOLUTION) ×2 IMPLANT
YANKAUER SUCT BULB TIP NO VENT (SUCTIONS) ×1 IMPLANT

## 2022-02-14 NOTE — Progress Notes (Signed)
OT Cancellation Note  Patient Details Name: Raegen Tarpley MRN: [id number] DOB: 1931-10-10   Cancelled Treatment:    Reason Eval/Treat Not Completed: Other (comment) (Plans for surgery today. Will assess when medically appropriate.)  Malakhai Beitler,HILLARY 02/14/2022, 8:59 AM Maurie Boettcher, OT/L   Acute OT Clinical Specialist Acute Rehabilitation Services Pager [phone] Office [phone]
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
## 2022-02-14 NOTE — Progress Notes (Signed)
FMTS Interim Progress Note  Evaluated pt post-op. She is resting comfortably in bed, opened her eyes. Daughter at bedside, reports that she only got part of her surgery today and scheduled for surgery again next Tuesday.   On exam, toes are warm and well perfused. Surgical bandages in place and appears clean.  - awaiting PT eval for placement - Repeat surgery next Tuesday scheduled with Ortho - Pain regimen   - Tylenol 1000 q6h scheduled  - Oxy 2.5 q4 prn (Can increase to '5mg'$  if needed) - Regular diet - Lovenox for DVT ppx  Arlyce Dice, MD 02/14/2022, 3:16 PM PGY-1, [place] [facility] pager [phone]

## 2022-02-14 NOTE — Transfer of Care (Signed)
Immediate Anesthesia Transfer of Care Note  Patient: Janet Duffy  Procedure(s) Performed: . EXTERNAL FIXATION RIGHT TIBIA (Right)  Patient Location: PACU  Anesthesia Type:General  Level of Consciousness: drowsy and patient cooperative  Airway & Oxygen Therapy: Patient Spontanous Breathing and Patient connected to nasal cannula oxygen  Post-op Assessment: Report given to RN and Post -op Vital signs reviewed and stable  Post vital signs: Reviewed and stable  Last Vitals:  Vitals Value Taken Time  BP 145/69 02/14/22 1230  Temp    Pulse 80 02/14/22 1233  Resp 15 02/14/22 1233  SpO2 99 % 02/14/22 1233  Vitals shown include unvalidated device data.  Last Pain:  Vitals:   02/14/22 0916  TempSrc:   PainSc: 5       Patients Stated Pain Goal: 4 (97/58/83 2549)  Complications: No notable events documented.

## 2022-02-14 NOTE — Anesthesia Preprocedure Evaluation (Addendum)
Anesthesia Evaluation  Patient identified by MRN, date of birth, ID band Patient awake    Reviewed: Allergy & Precautions, NPO status , Patient's Chart, lab work & pertinent test results  Airway Mallampati: II       Dental   Pulmonary neg pulmonary ROS   breath sounds clear to auscultation       Cardiovascular hypertension,  Rhythm:Regular Rate:Normal     Neuro/Psych negative neurological ROS     GI/Hepatic negative GI ROS, Neg liver ROS,,,  Endo/Other  negative endocrine ROS    Renal/GU negative Renal ROS     Musculoskeletal   Abdominal   Peds  Hematology   Anesthesia Other Findings   Reproductive/Obstetrics                             Anesthesia Physical Anesthesia Plan  ASA: 3  Anesthesia Plan: General   Post-op Pain Management: Tylenol PO (pre-op)*   Induction:   PONV Risk Score and Plan: 3 and Ondansetron, Dexamethasone and Midazolam  Airway Management Planned: Oral ETT  Additional Equipment:   Intra-op Plan:   Post-operative Plan:   Informed Consent: I have reviewed the patients History and Physical, chart, labs and discussed the procedure including the risks, benefits and alternatives for the proposed anesthesia with the patient or authorized representative who has indicated his/her understanding and acceptance.     Dental advisory given  Plan Discussed with: CRNA and Anesthesiologist  Anesthesia Plan Comments:        Anesthesia Quick Evaluation
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
## 2022-02-14 NOTE — Anesthesia Postprocedure Evaluation (Signed)
Anesthesia Post Note  Patient: Janet Duffy  Procedure(s) Performed: . EXTERNAL FIXATION RIGHT TIBIA (Right)     Patient location during evaluation: PACU Anesthesia Type: General Level of consciousness: awake Pain management: pain level controlled Vital Signs Assessment: post-procedure vital signs reviewed and stable Respiratory status: spontaneous breathing Cardiovascular status: stable Postop Assessment: no apparent nausea or vomiting Anesthetic complications: no   No notable events documented.  Last Vitals:  Vitals:   02/14/22 1300 02/14/22 1317  BP: 121/63 130/65  Pulse: 81 84  Resp: 10 15  Temp: 36.7 C 37 C  SpO2: 97% 92%    Last Pain:  Vitals:   02/14/22 1317  TempSrc: Oral  PainSc:                  [name]

## 2022-02-14 NOTE — Anesthesia Procedure Notes (Addendum)
Procedure Name: Intubation Date/Time: 02/14/2022 11:19 AM  Performed by: Colin Benton, CRNAPre-anesthesia Checklist: Patient identified, Emergency Drugs available, Suction available and Patient being monitored Patient Re-evaluated:Patient Re-evaluated prior to induction Oxygen Delivery Method: Circle System Utilized Preoxygenation: Pre-oxygenation with 100% oxygen Induction Type: IV induction, Rapid sequence and Cricoid Pressure applied Ventilation: Mask ventilation without difficulty Laryngoscope Size: Miller and 2 Grade View: Grade I Tube type: Oral Tube size: 7.0 mm Number of attempts: 1 Airway Equipment and Method: Stylet Placement Confirmation: ETT inserted through vocal cords under direct vision, positive ETCO2 and breath sounds checked- equal and bilateral Secured at: 21 cm Tube secured with: Tape Dental Injury: Teeth and Oropharynx as per pre-operative assessment

## 2022-02-15 ENCOUNTER — Encounter (HOSPITAL_COMMUNITY): Payer: Self-pay | Admitting: Orthopedic Surgery

## 2022-02-15 DIAGNOSIS — S82101A Unspecified fracture of upper end of right tibia, initial encounter for closed fracture: Secondary | ICD-10-CM | POA: Diagnosis not present

## 2022-02-15 LAB — CBC
HCT: 23.4 % — ABNORMAL LOW (ref 36.0–46.0)
Hemoglobin: 8.2 g/dL — ABNORMAL LOW (ref 12.0–15.0)
MCH: 32.7 pg (ref 26.0–34.0)
MCHC: 35 g/dL (ref 30.0–36.0)
MCV: 93.2 fL (ref 80.0–100.0)
Platelets: 129 10*3/uL — ABNORMAL LOW (ref 150–400)
RBC: 2.51 MIL/uL — ABNORMAL LOW (ref 3.87–5.11)
RDW: 14.6 % (ref 11.5–15.5)
WBC: 9.2 10*3/uL (ref 4.0–10.5)
nRBC: 0 % (ref 0.0–0.2)

## 2022-02-15 LAB — VITAMIN D 25 HYDROXY (VIT D DEFICIENCY, FRACTURES): Vit D, 25-Hydroxy: 42.21 ng/mL (ref 30–100)

## 2022-02-15 MED ORDER — SENNA 8.6 MG PO TABS
1.0000 | ORAL_TABLET | Freq: Every day | ORAL | Status: DC
  Administered 2022-02-15: 8.6 mg via ORAL
  Filled 2022-02-15: qty 1

## 2022-02-15 MED ORDER — VITAMIN D 25 MCG (1000 UNIT) PO TABS
1000.0000 [IU] | ORAL_TABLET | Freq: Every day | ORAL | Status: DC
  Administered 2022-02-15 – 2022-03-05 (×19): 1000 [IU] via ORAL
  Filled 2022-02-15: qty 1
  Filled 2022-02-15 (×2): qty 3
  Filled 2022-02-15 (×3): qty 1
  Filled 2022-02-15: qty 3
  Filled 2022-02-15 (×5): qty 1
  Filled 2022-02-15 (×2): qty 3
  Filled 2022-02-15 (×2): qty 1
  Filled 2022-02-15: qty 3
  Filled 2022-02-15 (×2): qty 1
  Filled 2022-02-15: qty 3

## 2022-02-15 MED ORDER — POLYETHYLENE GLYCOL 3350 17 G PO PACK
17.0000 g | PACK | Freq: Every day | ORAL | Status: DC
  Administered 2022-02-15: 17 g via ORAL
  Filled 2022-02-15: qty 1
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
## 2022-02-15 NOTE — Progress Notes (Signed)
Inpatient Rehab Admissions Coordinator:   Per therapy recommendations, patient was screened for CIR candidacy by Clemens Catholic, MS, CCC-SLP. At this time, Pt. does not appear to demonstrate medical necessity to justify in hospital rehabilitation/CIR. I do not believe her insurance will approve AIR level of care for this diagnosis either.  Additionally, pt. To return to OR in 7 days. I  will not pursue a rehab consult for this Pt.   Recommend other rehab venues to be pursued.  Please contact me with any questions.   Clemens Catholic, [place], [place] Admissions Coordinator  [phone] ([place]) [phone] (office)
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
## 2022-02-15 NOTE — Plan of Care (Signed)
  Problem: Education: Goal: Knowledge of General Education information will improve Description: Including pain rating scale, medication(s)/side effects and non-pharmacologic comfort measures Outcome: Progressing   Problem: Health Behavior/Discharge Planning: Goal: Ability to manage health-related needs will improve Outcome: Progressing   Problem: Activity: Goal: Risk for activity intolerance will decrease Outcome: Progressing   Problem: Coping: Goal: Level of anxiety will decrease Outcome: Progressing   Problem: Skin Integrity: Goal: Risk for impaired skin integrity will decrease Outcome: Progressing

## 2022-02-16 DIAGNOSIS — S82101A Unspecified fracture of upper end of right tibia, initial encounter for closed fracture: Secondary | ICD-10-CM | POA: Diagnosis not present

## 2022-02-16 LAB — CBC
HCT: 23.1 % — ABNORMAL LOW (ref 36.0–46.0)
Hemoglobin: 7.7 g/dL — ABNORMAL LOW (ref 12.0–15.0)
MCH: 31.4 pg (ref 26.0–34.0)
MCHC: 33.3 g/dL (ref 30.0–36.0)
MCV: 94.3 fL (ref 80.0–100.0)
Platelets: 169 10*3/uL (ref 150–400)
RBC: 2.45 MIL/uL — ABNORMAL LOW (ref 3.87–5.11)
RDW: 14.6 % (ref 11.5–15.5)
WBC: 8 10*3/uL (ref 4.0–10.5)
nRBC: 0 % (ref 0.0–0.2)

## 2022-02-16 MED ORDER — POLYETHYLENE GLYCOL 3350 17 G PO PACK
17.0000 g | PACK | Freq: Two times a day (BID) | ORAL | Status: DC
  Administered 2022-02-16 – 2022-02-17 (×4): 17 g via ORAL
  Filled 2022-02-16 (×4): qty 1

## 2022-02-16 MED ORDER — SENNA 8.6 MG PO TABS
1.0000 | ORAL_TABLET | Freq: Two times a day (BID) | ORAL | Status: DC
  Administered 2022-02-16 – 2022-02-17 (×4): 8.6 mg via ORAL
  Filled 2022-02-16 (×4): qty 1
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
## 2022-02-16 NOTE — Plan of Care (Signed)
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
## 2022-02-16 NOTE — Care Management Important Message (Signed)
Important Message  Patient Details  Name: Janet Duffy MRN: [id number] Date of Birth: Jan 05, 1932   Medicare Important Message Given:  Yes     Hannah Beat 02/16/2022, 12:06 PM
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
## 2022-02-16 NOTE — Progress Notes (Deleted)
Consulted Cards ffor management of Afib, they plan to eval pt.

## 2022-02-17 DIAGNOSIS — S82141S Displaced bicondylar fracture of right tibia, sequela: Secondary | ICD-10-CM

## 2022-02-17 DIAGNOSIS — D62 Acute posthemorrhagic anemia: Secondary | ICD-10-CM | POA: Diagnosis not present

## 2022-02-17 DIAGNOSIS — I159 Secondary hypertension, unspecified: Secondary | ICD-10-CM | POA: Diagnosis not present

## 2022-02-17 DIAGNOSIS — D649 Anemia, unspecified: Secondary | ICD-10-CM | POA: Insufficient documentation

## 2022-02-17 LAB — CBC
HCT: 22.2 % — ABNORMAL LOW (ref 36.0–46.0)
Hemoglobin: 7.5 g/dL — ABNORMAL LOW (ref 12.0–15.0)
MCH: 31.6 pg (ref 26.0–34.0)
MCHC: 33.8 g/dL (ref 30.0–36.0)
MCV: 93.7 fL (ref 80.0–100.0)
Platelets: 176 10*3/uL (ref 150–400)
RBC: 2.37 MIL/uL — ABNORMAL LOW (ref 3.87–5.11)
RDW: 14.5 % (ref 11.5–15.5)
WBC: 5.2 10*3/uL (ref 4.0–10.5)
nRBC: 0 % (ref 0.0–0.2)
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
## 2022-02-17 NOTE — Plan of Care (Signed)
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
## 2022-02-17 NOTE — Assessment & Plan Note (Addendum)
Hgb stable at 10.0 today. No active bleeding. - Transfuse for Hgb <7

## 2022-02-18 DIAGNOSIS — S82141D Displaced bicondylar fracture of right tibia, subsequent encounter for closed fracture with routine healing: Secondary | ICD-10-CM

## 2022-02-18 DIAGNOSIS — S82141S Displaced bicondylar fracture of right tibia, sequela: Secondary | ICD-10-CM | POA: Diagnosis not present

## 2022-02-18 LAB — BASIC METABOLIC PANEL
Anion gap: 8 (ref 5–15)
BUN: 19 mg/dL (ref 8–23)
CO2: 27 mmol/L (ref 22–32)
Calcium: 9.1 mg/dL (ref 8.9–10.3)
Chloride: 100 mmol/L (ref 98–111)
Creatinine, Ser: 0.81 mg/dL (ref 0.44–1.00)
GFR, Estimated: >60 mL/min (ref >60)
Glucose, Bld: 107 mg/dL — ABNORMAL HIGH (ref 70–99)
Potassium: 4.1 mmol/L (ref 3.5–5.1)
Sodium: 135 mmol/L (ref 135–145)

## 2022-02-18 LAB — CBC
HCT: 24.2 % — ABNORMAL LOW (ref 36.0–46.0)
Hemoglobin: 7.9 g/dL — ABNORMAL LOW (ref 12.0–15.0)
MCH: 31.3 pg (ref 26.0–34.0)
MCHC: 32.6 g/dL (ref 30.0–36.0)
MCV: 96 fL (ref 80.0–100.0)
Platelets: 213 10*3/uL (ref 150–400)
RBC: 2.52 MIL/uL — ABNORMAL LOW (ref 3.87–5.11)
RDW: 14.6 % (ref 11.5–15.5)
WBC: 6.2 10*3/uL (ref 4.0–10.5)
nRBC: 0 % (ref 0.0–0.2)

## 2022-02-18 MED ORDER — IBUPROFEN 200 MG PO TABS
400.0000 mg | ORAL_TABLET | Freq: Four times a day (QID) | ORAL | Status: DC | PRN
  Filled 2022-02-18: qty 2

## 2022-02-18 MED ORDER — SENNA 8.6 MG PO TABS
1.0000 | ORAL_TABLET | Freq: Every day | ORAL | Status: DC
  Administered 2022-02-20 – 2022-02-21 (×2): 8.6 mg via ORAL
  Filled 2022-02-18 (×4): qty 1

## 2022-02-18 MED ORDER — IBUPROFEN 200 MG PO TABS
400.0000 mg | ORAL_TABLET | Freq: Two times a day (BID) | ORAL | Status: DC | PRN
  Administered 2022-02-18: 400 mg via ORAL
  Filled 2022-02-18: qty 2

## 2022-02-18 MED ORDER — LISINOPRIL 20 MG PO TABS: 40.0000 mg | ORAL_TABLET | Freq: Every day | ORAL | Status: DC

## 2022-02-18 MED ORDER — LISINOPRIL 20 MG PO TABS
20.0000 mg | ORAL_TABLET | Freq: Every day | ORAL | Status: DC
  Administered 2022-02-19 – 2022-02-23 (×4): 20 mg via ORAL
  Filled 2022-02-18 (×5): qty 1

## 2022-02-18 MED ORDER — POLYETHYLENE GLYCOL 3350 17 G PO PACK
17.0000 g | PACK | Freq: Every day | ORAL | Status: DC
  Administered 2022-02-20 – 2022-02-21 (×2): 17 g via ORAL
  Filled 2022-02-18 (×4): qty 1

## 2022-02-18 NOTE — Progress Notes (Signed)
Pt had bowel movement.  Reinforced dressing at bottom of device.  Replaced dressing at bottom of device.  Pt still having signs of bleeding.  Will continue to monitor.

## 2022-02-18 NOTE — Plan of Care (Signed)
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
## 2022-02-19 DIAGNOSIS — S82141S Displaced bicondylar fracture of right tibia, sequela: Secondary | ICD-10-CM | POA: Diagnosis not present
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
## 2022-02-19 NOTE — TOC Progression Note (Signed)
Transition of Care [facility]) - Progression Note    Patient Details  Name: Janet Duffy MRN: [id number] Date of Birth: 1931-09-05  Transition of Care [facility]) CM/SW [place], [place] Phone Number: 02/19/2022, 2:14 PM  Clinical Narrative:     Assessment complete. Plan to fax out for SNF closer to patient being medically ready for dc. TOC will continue to follow and assist with patients dc planning needs.  Expected Discharge Plan: [place] Barriers to Discharge: Continued Medical Work up, SNF Pending bed offer, Other (must enter comment) (surgery on 11/21)  Expected Discharge Plan and Services Expected Discharge Plan: [place] In-house Referral: Clinical Social Work   Post Acute Care Choice: [place] Living arrangements for the past 2 months: Single Family Home                                       Social Determinants of Health (SDOH) Interventions    Readmission Risk Interventions     No data to display

## 2022-02-20 DIAGNOSIS — S82141S Displaced bicondylar fracture of right tibia, sequela: Secondary | ICD-10-CM | POA: Diagnosis not present

## 2022-02-20 MED ORDER — ENOXAPARIN SODIUM 40 MG/0.4ML IJ SOSY
40.0000 mg | PREFILLED_SYRINGE | Freq: Every day | INTRAMUSCULAR | Status: DC
  Administered 2022-02-20: 40 mg via SUBCUTANEOUS
  Filled 2022-02-20: qty 0.4

## 2022-02-21 DIAGNOSIS — S82141S Displaced bicondylar fracture of right tibia, sequela: Secondary | ICD-10-CM | POA: Diagnosis not present

## 2022-02-21 LAB — BASIC METABOLIC PANEL
Anion gap: 8 (ref 5–15)
Anion gap: 9 (ref 5–15)
BUN: 17 mg/dL (ref 8–23)
BUN: 15 mg/dL (ref 8–23)
CO2: 24 mmol/L (ref 22–32)
CO2: 25 mmol/L (ref 22–32)
Calcium: 8.6 mg/dL — ABNORMAL LOW (ref 8.9–10.3)
Calcium: 8.8 mg/dL — ABNORMAL LOW (ref 8.9–10.3)
Chloride: 102 mmol/L (ref 98–111)
Chloride: 100 mmol/L (ref 98–111)
Creatinine, Ser: 0.72 mg/dL (ref 0.44–1.00)
Creatinine, Ser: 0.71 mg/dL (ref 0.44–1.00)
GFR, Estimated: >60 mL/min (ref >60)
GFR, Estimated: >60 mL/min (ref >60)
Glucose, Bld: 134 mg/dL — ABNORMAL HIGH (ref 70–99)
Glucose, Bld: 146 mg/dL — ABNORMAL HIGH (ref 70–99)
Potassium: 3.8 mmol/L (ref 3.5–5.1)
Potassium: 3.9 mmol/L (ref 3.5–5.1)
Sodium: 134 mmol/L — ABNORMAL LOW (ref 135–145)
Sodium: 134 mmol/L — ABNORMAL LOW (ref 135–145)

## 2022-02-21 LAB — CBC
HCT: 22.6 % — ABNORMAL LOW (ref 36.0–46.0)
Hemoglobin: 7.4 g/dL — ABNORMAL LOW (ref 12.0–15.0)
MCH: 31.6 pg (ref 26.0–34.0)
MCHC: 32.7 g/dL (ref 30.0–36.0)
MCV: 96.6 fL (ref 80.0–100.0)
Platelets: 296 10*3/uL (ref 150–400)
RBC: 2.34 MIL/uL — ABNORMAL LOW (ref 3.87–5.11)
RDW: 15.3 % (ref 11.5–15.5)
WBC: 6.6 10*3/uL (ref 4.0–10.5)
nRBC: 0 % (ref 0.0–0.2)

## 2022-02-21 LAB — PREPARE RBC (CROSSMATCH)

## 2022-02-21 MED ORDER — DIPHENHYDRAMINE HCL 25 MG PO CAPS
25.0000 mg | ORAL_CAPSULE | Freq: Once | ORAL | Status: AC
  Administered 2022-02-21: 25 mg via ORAL
  Filled 2022-02-21: qty 1

## 2022-02-21 MED ORDER — ACETAMINOPHEN 325 MG PO TABS: 650.0000 mg | ORAL_TABLET | Freq: Once | ORAL | Status: DC

## 2022-02-21 MED ORDER — FUROSEMIDE 10 MG/ML IJ SOLN
20.0000 mg | Freq: Once | INTRAMUSCULAR | Status: DC
  Filled 2022-02-21: qty 2

## 2022-02-21 MED ORDER — CEFAZOLIN SODIUM-DEXTROSE 2-4 GM/100ML-% IV SOLN
2.0000 g | INTRAVENOUS | Status: AC
  Administered 2022-02-22: 2 g via INTRAVENOUS
  Filled 2022-02-21: qty 100

## 2022-02-21 MED ORDER — SODIUM CHLORIDE 0.9% IV SOLUTION: Freq: Once | INTRAVENOUS | Status: AC

## 2022-02-21 MED ORDER — TRANEXAMIC ACID-NACL 1000-0.7 MG/100ML-% IV SOLN
1000.0000 mg | INTRAVENOUS | Status: AC
  Administered 2022-02-22: 1000 mg via INTRAVENOUS
  Filled 2022-02-21: qty 100

## 2022-02-21 MED ORDER — NAPHAZOLINE-GLYCERIN 0.012-0.25 % OP SOLN
1.0000 [drp] | Freq: Four times a day (QID) | OPHTHALMIC | Status: DC | PRN
  Administered 2022-02-21 (×2): 1 [drp] via OPHTHALMIC
  Administered 2022-02-22: 2 [drp] via OPHTHALMIC
  Administered 2022-02-23 – 2022-02-25 (×2): 1 [drp] via OPHTHALMIC
  Filled 2022-02-21 (×3): qty 15

## 2022-02-21 NOTE — Progress Notes (Signed)
Mobility Specialist Progress Note   02/21/22 1445  Mobility  Activity Transferred to/from [facility];Transferred from chair to bed  Level of Assistance +2 (takes two people) (Mod A)  Assistive Device BSC (Arms Held)  Range of Motion/Exercises Passive;Right leg  RLE Weight Bearing NWB  Activity Response Tolerated well   Patient received in recliner requesting assistance to [facility] then back to bed. Required mod A + 2 to stand and pivot to-from BSC and then to bed. Also required total A for pericare while standing statically with good adherence to NWB on RLE. Tolerated without incident but c/o lightheadedness while seated on commode in which all VSS. Was left in supine with all needs met, call bell in reach.   [place] Satina Jerrell, BS EXP Mobility Specialist Please contact via SecureChat or Rehab office at [phone]

## 2022-02-21 NOTE — Plan of Care (Signed)
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
## 2022-02-21 NOTE — Progress Notes (Signed)
   02/21/22 1530  Clinical Encounter Type  Visited With Patient and family together  Visit Type Initial  Referral From Nurse  Consult/Referral To Chaplain  Advance Directives (For Healthcare)  Does Patient Have a Medical Advance Directive? Yes  Would patient like information on creating a medical advance directive? Yes (Inpatient - patient requests chaplain consult to create a medical advance directive)   Chaplain responded to phone request from Nurse [place].  The patient's daughter Janet Duffy) was appointed as the [facility]. While the patient had a HCPOA with [facility], patient's daughter could not assess the document. The patient was thankful for the quick turnaround because she is having surgery tomorrow.  Chaplain prepared the required copies and scanned a copy for the hospital file.  Chaplain also provided prayer with patient and her daughter.     Melody Haver, Resident Chaplain [phone]
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
## 2022-02-22 ENCOUNTER — Encounter (HOSPITAL_COMMUNITY): Payer: Self-pay | Admitting: Family Medicine

## 2022-02-22 ENCOUNTER — Encounter (HOSPITAL_COMMUNITY)
Admission: EM | Disposition: A | Discharge status: Skilled Nursing Facility | Payer: Self-pay | Source: Home / Self Care | Attending: Family Medicine

## 2022-02-22 ENCOUNTER — Inpatient Hospital Stay (HOSPITAL_COMMUNITY): Payer: Medicare HMO | Admitting: Anesthesiology

## 2022-02-22 ENCOUNTER — Inpatient Hospital Stay (HOSPITAL_COMMUNITY): Payer: Medicare HMO

## 2022-02-22 DIAGNOSIS — I10 Essential (primary) hypertension: Secondary | ICD-10-CM

## 2022-02-22 DIAGNOSIS — S82141A Displaced bicondylar fracture of right tibia, initial encounter for closed fracture: Secondary | ICD-10-CM

## 2022-02-22 DIAGNOSIS — S82141S Displaced bicondylar fracture of right tibia, sequela: Secondary | ICD-10-CM | POA: Diagnosis not present

## 2022-02-22 DIAGNOSIS — M1990 Unspecified osteoarthritis, unspecified site: Secondary | ICD-10-CM | POA: Diagnosis not present

## 2022-02-22 DIAGNOSIS — D649 Anemia, unspecified: Secondary | ICD-10-CM

## 2022-02-22 HISTORY — PX: ORIF TIBIA PLATEAU: SHX2132

## 2022-02-22 HISTORY — PX: EXTERNAL FIXATION REMOVAL: SHX5040

## 2022-02-22 LAB — TYPE AND SCREEN
ABO/RH(D): O POS
Antibody Screen: NEGATIVE
Unit division: 0
Unit division: 0

## 2022-02-22 LAB — BASIC METABOLIC PANEL
Anion gap: 11 (ref 5–15)
BUN: 14 mg/dL (ref 8–23)
CO2: 25 mmol/L (ref 22–32)
Calcium: 8.9 mg/dL (ref 8.9–10.3)
Chloride: 99 mmol/L (ref 98–111)
Creatinine, Ser: 0.74 mg/dL (ref 0.44–1.00)
GFR, Estimated: >60 mL/min (ref >60)
Glucose, Bld: 104 mg/dL — ABNORMAL HIGH (ref 70–99)
Potassium: 3.3 mmol/L — ABNORMAL LOW (ref 3.5–5.1)
Sodium: 135 mmol/L (ref 135–145)

## 2022-02-22 LAB — BPAM RBC
Blood Product Expiration Date: 202311272359
Blood Product Expiration Date: 202312102359
ISSUE DATE / TIME: 202311201513
ISSUE DATE / TIME: 202311201810
Unit Type and Rh: 5100
Unit Type and Rh: 5100

## 2022-02-22 LAB — CBC
HCT: 29.7 % — ABNORMAL LOW (ref 36.0–46.0)
Hemoglobin: 10.4 g/dL — ABNORMAL LOW (ref 12.0–15.0)
MCH: 31.4 pg (ref 26.0–34.0)
MCHC: 35 g/dL (ref 30.0–36.0)
MCV: 89.7 fL (ref 80.0–100.0)
Platelets: 282 10*3/uL (ref 150–400)
RBC: 3.31 MIL/uL — ABNORMAL LOW (ref 3.87–5.11)
RDW: 18.6 % — ABNORMAL HIGH (ref 11.5–15.5)
WBC: 6 10*3/uL (ref 4.0–10.5)
nRBC: 0 % (ref 0.0–0.2)

## 2022-02-22 LAB — GLUCOSE, CAPILLARY: Glucose-Capillary: 99 mg/dL (ref 70–99)

## 2022-02-22 SURGERY — OPEN REDUCTION INTERNAL FIXATION (ORIF) TIBIAL PLATEAU
Anesthesia: General | Site: Leg Lower | Laterality: Right

## 2022-02-22 MED ORDER — POLYETHYLENE GLYCOL 3350 17 G PO PACK
17.0000 g | PACK | Freq: Two times a day (BID) | ORAL | Status: DC
  Administered 2022-02-23 – 2022-02-24 (×4): 17 g via ORAL
  Filled 2022-02-22 (×11): qty 1

## 2022-02-22 MED ORDER — FENTANYL CITRATE (PF) 100 MCG/2ML IJ SOLN
INTRAMUSCULAR | Status: AC
  Filled 2022-02-22: qty 2

## 2022-02-22 MED ORDER — ORAL CARE MOUTH RINSE: 15.0000 mL | Freq: Once | OROMUCOSAL | Status: AC

## 2022-02-22 MED ORDER — PHENYLEPHRINE HCL-NACL 20-0.9 MG/250ML-% IV SOLN
INTRAVENOUS | Status: DC | PRN
  Administered 2022-02-22: 40 ug/min via INTRAVENOUS

## 2022-02-22 MED ORDER — LIDOCAINE 2% (20 MG/ML) 5 ML SYRINGE
INTRAMUSCULAR | Status: AC
  Filled 2022-02-22: qty 5

## 2022-02-22 MED ORDER — SENNA 8.6 MG PO TABS
1.0000 | ORAL_TABLET | Freq: Two times a day (BID) | ORAL | Status: DC
  Administered 2022-02-23 – 2022-02-27 (×9): 8.6 mg via ORAL
  Filled 2022-02-22 (×11): qty 1

## 2022-02-22 MED ORDER — HYDRALAZINE HCL 20 MG/ML IJ SOLN
INTRAMUSCULAR | Status: AC
  Filled 2022-02-22: qty 1

## 2022-02-22 MED ORDER — HYDRALAZINE HCL 20 MG/ML IJ SOLN
5.0000 mg | Freq: Once | INTRAMUSCULAR | Status: AC
  Administered 2022-02-22: 5 mg via INTRAVENOUS

## 2022-02-22 MED ORDER — ROCURONIUM BROMIDE 100 MG/10ML IV SOLN
INTRAVENOUS | Status: DC | PRN
  Administered 2022-02-22: 20 mg via INTRAVENOUS
  Administered 2022-02-22: 50 mg via INTRAVENOUS

## 2022-02-22 MED ORDER — CHLORHEXIDINE GLUCONATE 0.12 % MT SOLN
OROMUCOSAL | Status: AC
  Administered 2022-02-22: 15 mL via OROMUCOSAL
  Filled 2022-02-22: qty 15

## 2022-02-22 MED ORDER — PROPOFOL 10 MG/ML IV BOLUS
INTRAVENOUS | Status: DC | PRN
  Administered 2022-02-22: 100 mg via INTRAVENOUS

## 2022-02-22 MED ORDER — ONDANSETRON HCL 4 MG/2ML IJ SOLN
INTRAMUSCULAR | Status: DC | PRN
  Administered 2022-02-22: 4 mg via INTRAVENOUS

## 2022-02-22 MED ORDER — FENTANYL CITRATE (PF) 250 MCG/5ML IJ SOLN
INTRAMUSCULAR | Status: AC
  Filled 2022-02-22: qty 5

## 2022-02-22 MED ORDER — OXYCODONE HCL 5 MG PO TABS
5.0000 mg | ORAL_TABLET | Freq: Four times a day (QID) | ORAL | Status: DC | PRN
  Administered 2022-02-23 – 2022-03-01 (×9): 5 mg via ORAL
  Filled 2022-02-22 (×9): qty 1

## 2022-02-22 MED ORDER — FENTANYL CITRATE (PF) 100 MCG/2ML IJ SOLN
INTRAMUSCULAR | Status: DC | PRN
  Administered 2022-02-22 (×2): 25 ug via INTRAVENOUS
  Administered 2022-02-22: 100 ug via INTRAVENOUS
  Administered 2022-02-22: 50 ug via INTRAVENOUS

## 2022-02-22 MED ORDER — HYDRALAZINE HCL 50 MG PO TABS
50.0000 mg | ORAL_TABLET | Freq: Once | ORAL | Status: AC
  Administered 2022-02-22: 50 mg via ORAL
  Filled 2022-02-22: qty 1

## 2022-02-22 MED ORDER — POTASSIUM CHLORIDE 10 MEQ/100ML IV SOLN
10.0000 meq | INTRAVENOUS | Status: AC
  Administered 2022-02-22 (×4): 10 meq via INTRAVENOUS
  Filled 2022-02-22 (×4): qty 100

## 2022-02-22 MED ORDER — CHLORHEXIDINE GLUCONATE 0.12 % MT SOLN: 15.0000 mL | Freq: Once | OROMUCOSAL | Status: AC

## 2022-02-22 MED ORDER — 0.9 % SODIUM CHLORIDE (POUR BTL) OPTIME
TOPICAL | Status: DC | PRN
  Administered 2022-02-22: 1000 mL

## 2022-02-22 MED ORDER — TRANEXAMIC ACID-NACL 1000-0.7 MG/100ML-% IV SOLN
INTRAVENOUS | Status: AC
  Filled 2022-02-22: qty 100

## 2022-02-22 MED ORDER — PHENYLEPHRINE HCL (PRESSORS) 10 MG/ML IV SOLN
INTRAVENOUS | Status: DC | PRN
  Administered 2022-02-22 (×2): 160 ug via INTRAVENOUS

## 2022-02-22 MED ORDER — ONDANSETRON HCL 4 MG/2ML IJ SOLN
INTRAMUSCULAR | Status: AC
  Filled 2022-02-22: qty 2

## 2022-02-22 MED ORDER — LACTATED RINGERS IV SOLN: INTRAVENOUS | Status: DC

## 2022-02-22 MED ORDER — LIDOCAINE HCL (CARDIAC) PF 100 MG/5ML IV SOSY
PREFILLED_SYRINGE | INTRAVENOUS | Status: DC | PRN
  Administered 2022-02-22: 60 mg via INTRAVENOUS

## 2022-02-22 MED ORDER — ESMOLOL HCL 100 MG/10ML IV SOLN
INTRAVENOUS | Status: AC
  Filled 2022-02-22: qty 10

## 2022-02-22 MED ORDER — PROPOFOL 10 MG/ML IV BOLUS
INTRAVENOUS | Status: AC
  Filled 2022-02-22: qty 20

## 2022-02-22 MED ORDER — SUGAMMADEX SODIUM 200 MG/2ML IV SOLN
INTRAVENOUS | Status: DC | PRN
  Administered 2022-02-22: 200 mg via INTRAVENOUS

## 2022-02-22 MED ORDER — FENTANYL CITRATE (PF) 100 MCG/2ML IJ SOLN
25.0000 ug | INTRAMUSCULAR | Status: DC | PRN
  Administered 2022-02-22 (×3): 50 ug via INTRAVENOUS

## 2022-02-22 MED ORDER — DEXAMETHASONE SODIUM PHOSPHATE 10 MG/ML IJ SOLN
INTRAMUSCULAR | Status: DC | PRN
  Administered 2022-02-22: 10 mg via INTRAVENOUS

## 2022-02-22 MED ORDER — ALBUMIN HUMAN 5 % IV SOLN: INTRAVENOUS | Status: DC | PRN

## 2022-02-22 MED ORDER — MORPHINE SULFATE (PF) 2 MG/ML IV SOLN
2.0000 mg | INTRAVENOUS | Status: DC | PRN
  Filled 2022-02-22: qty 1

## 2022-02-22 SURGICAL SUPPLY — 86 items
BAG COUNTER SPONGE SURGICOUNT (BAG) ×1 IMPLANT
BANDAGE ESMARK 6X9 LF (GAUZE/BANDAGES/DRESSINGS) ×1 IMPLANT
BIT DRILL 3.3 LONG (BIT) IMPLANT
BIT DRILL QC 3.3X195 (BIT) IMPLANT
BLADE CLIPPER SURG (BLADE) IMPLANT
BLADE SURG 10 STRL SS (BLADE) ×1 IMPLANT
BLADE SURG 15 STRL LF DISP TIS (BLADE) ×1 IMPLANT
BLADE SURG 15 STRL SS (BLADE)
BNDG COHESIVE 4X5 TAN STRL (GAUZE/BANDAGES/DRESSINGS) ×1 IMPLANT
BNDG ELASTIC 4X5.8 VLCR STR LF (GAUZE/BANDAGES/DRESSINGS) ×1 IMPLANT
BNDG ELASTIC 6X5.8 VLCR STR LF (GAUZE/BANDAGES/DRESSINGS) ×1 IMPLANT
BNDG ESMARK 6X9 LF (GAUZE/BANDAGES/DRESSINGS)
BNDG GAUZE DERMACEA FLUFF 4 (GAUZE/BANDAGES/DRESSINGS) ×2 IMPLANT
BRUSH SCRUB EZ PLAIN DRY (MISCELLANEOUS) ×2 IMPLANT
CANISTER SUCT 3000ML PPV (MISCELLANEOUS) ×1 IMPLANT
CAP LOCK NCB (Cap) IMPLANT
COVER SURGICAL LIGHT HANDLE (MISCELLANEOUS) ×2 IMPLANT
CUFF TOURN SGL QUICK 34 (TOURNIQUET CUFF)
CUFF TRNQT CYL 34X4.125X (TOURNIQUET CUFF) ×1 IMPLANT
DRAPE C-ARM 42X72 X-RAY (DRAPES) ×1 IMPLANT
DRAPE C-ARMOR (DRAPES) ×1 IMPLANT
DRAPE HALF SHEET 40X57 (DRAPES) IMPLANT
DRAPE INCISE 23X17 IOBAN STRL (DRAPES) ×1
DRAPE INCISE 23X17 STRL (DRAPES) IMPLANT
DRAPE INCISE IOBAN 23X17 STRL (DRAPES) ×1 IMPLANT
DRAPE INCISE IOBAN 66X45 STRL (DRAPES) ×1 IMPLANT
DRAPE U-SHAPE 47X51 STRL (DRAPES) ×1 IMPLANT
DRSG ADAPTIC 3X8 NADH LF (GAUZE/BANDAGES/DRESSINGS) ×1 IMPLANT
DRSG MEPITEL 4X7.2 (GAUZE/BANDAGES/DRESSINGS) IMPLANT
ELECT REM PT RETURN 9FT ADLT (ELECTROSURGICAL) ×1
ELECTRODE REM PT RTRN 9FT ADLT (ELECTROSURGICAL) ×1 IMPLANT
GAUZE PAD ABD 8X10 STRL (GAUZE/BANDAGES/DRESSINGS) ×2 IMPLANT
GAUZE SPONGE 4X4 12PLY STRL (GAUZE/BANDAGES/DRESSINGS) ×1 IMPLANT
GLOVE BIO SURGEON STRL SZ7.5 (GLOVE) ×1 IMPLANT
GLOVE BIO SURGEON STRL SZ8 (GLOVE) ×1 IMPLANT
GLOVE BIOGEL PI IND STRL 7.5 (GLOVE) ×1 IMPLANT
GLOVE BIOGEL PI IND STRL 8 (GLOVE) ×1 IMPLANT
GLOVE SURG ORTHO LTX SZ7.5 (GLOVE) ×2 IMPLANT
GOWN BRE IMP SLV AUR LG STRL (GOWN DISPOSABLE) IMPLANT
GOWN STRL REUS W/ TWL LRG LVL3 (GOWN DISPOSABLE) ×2 IMPLANT
GOWN STRL REUS W/ TWL XL LVL3 (GOWN DISPOSABLE) ×1 IMPLANT
GOWN STRL REUS W/TWL LRG LVL3 (GOWN DISPOSABLE) ×2
GOWN STRL REUS W/TWL XL LVL3 (GOWN DISPOSABLE) ×1
IMMOBILIZER KNEE 22 UNIV (SOFTGOODS) ×1 IMPLANT
K-WIRE 2.0 (WIRE) ×3
K-WIRE FXSTD 280X2XNS SS (WIRE) ×3
KIT BASIN OR (CUSTOM PROCEDURE TRAY) ×1 IMPLANT
KIT TURNOVER KIT B (KITS) ×1 IMPLANT
KWIRE FXSTD 280X2XNS SS (WIRE) IMPLANT
MANIFOLD NEPTUNE II (INSTRUMENTS) ×1 IMPLANT
NDL 18GX1X1/2 (RX/OR ONLY) (NEEDLE) IMPLANT
NDL SUT 6 .5 CRC .975X.05 MAYO (NEEDLE) IMPLANT
NEEDLE 18GX1X1/2 (RX/OR ONLY) (NEEDLE) ×1 IMPLANT
NEEDLE MAYO TAPER (NEEDLE)
NS IRRIG 1000ML POUR BTL (IV SOLUTION) ×1 IMPLANT
PACK ORTHO EXTREMITY (CUSTOM PROCEDURE TRAY) ×1 IMPLANT
PAD ARMBOARD 7.5X6 YLW CONV (MISCELLANEOUS) ×2 IMPLANT
PAD CAST 4YDX4 CTTN HI CHSV (CAST SUPPLIES) ×1 IMPLANT
PADDING CAST COTTON 4X4 STRL (CAST SUPPLIES)
PADDING CAST COTTON 6X4 STRL (CAST SUPPLIES) ×3 IMPLANT
PLATE NCB LAT PROX 3H TIBIA 7H (Plate) IMPLANT
SCREW NCB 4.0 28MM (Screw) IMPLANT
SCREW NCB 4.0X26MM (Screw) IMPLANT
SCREW NCB 4X3 4X70 (Screw) IMPLANT
SPONGE T-LAP 18X18 ~~LOC~~+RFID (SPONGE) ×1 IMPLANT
STAPLER VISISTAT 35W (STAPLE) ×1 IMPLANT
STOCKINETTE IMPERVIOUS LG (DRAPES) ×1 IMPLANT
SUCTION FRAZIER HANDLE 10FR (MISCELLANEOUS) ×1
SUCTION TUBE FRAZIER 10FR DISP (MISCELLANEOUS) ×1 IMPLANT
SUT ETHILON 2 0 FS 18 (SUTURE) IMPLANT
SUT ETHILON 2 0 PSLX (SUTURE) IMPLANT
SUT PROLENE 0 CT 2 (SUTURE) ×2 IMPLANT
SUT VIC AB 0 CT1 27 (SUTURE)
SUT VIC AB 0 CT1 27XBRD ANBCTR (SUTURE) ×1 IMPLANT
SUT VIC AB 1 CT1 27 (SUTURE)
SUT VIC AB 1 CT1 27XBRD ANBCTR (SUTURE) ×1 IMPLANT
SUT VIC AB 2-0 CT1 27 (SUTURE) ×1
SUT VIC AB 2-0 CT1 TAPERPNT 27 (SUTURE) ×2 IMPLANT
SYR 20ML LL LF (SYRINGE) IMPLANT
TOWEL GREEN STERILE (TOWEL DISPOSABLE) ×2 IMPLANT
TOWEL GREEN STERILE FF (TOWEL DISPOSABLE) ×2 IMPLANT
TRAY FOLEY MTR SLVR 16FR STAT (SET/KITS/TRAYS/PACK) IMPLANT
TUBE CONNECTING 12X1/4 (SUCTIONS) ×1 IMPLANT
UNDERPAD 30X36 HEAVY ABSORB (UNDERPADS AND DIAPERS) ×1 IMPLANT
WATER STERILE IRR 1000ML POUR (IV SOLUTION) ×2 IMPLANT
YANKAUER SUCT BULB TIP NO VENT (SUCTIONS) ×1 IMPLANT

## 2022-02-22 NOTE — Plan of Care (Signed)

## 2022-02-22 NOTE — Op Note (Incomplete)
7:02 PM 02/22/2022 02/12/2022 - 02/22/2022  PATIENT:  Janet Duffy  88 y.o. female  [id number]  PRE-OPERATIVE DIAGNOSIS:   1. RIGHT BICONDYLAR TIBIAL PLATEAU FRACTURE 2. RETAINED EXTERNAL FIXATOR  POST-OPERATIVE DIAGNOSIS:   1. RIGHT BICONDYLAR TIBIAL PLATEAU FRACTURE 2. RETAINED EXTERNAL FIXATOR  PROCEDURE:   1. ORIF OF RIGHT BICONDYLAR TIBIAL PLATEAU FRACTURE  2. ASPIRATION OF RIGHT KNEE HEMATOMA 60 CC 3. REMOVAL OF EXTERNAL FIXATOR UNDER ANESTHESIA 4. CURETTAGE OF ULCERATED PIN SITES INCLUDING BONE OF TIBIA AND FEMUR 5. APPLICATION OF  MANUAL STRESS UNDER ANESTHESIA  SURGEON:  Surgeon(s) and Role:    Altamese [place], MD - Primary  PHYSICIAN ASSISTANT: Ainsley Spinner, PA-C  ANESTHESIA:   general  I/O:  No intake/output data recorded.  SPECIMEN:  None  TOURNIQUET:  * Missing tourniquet times found for documented tourniquets in log: [id number] *  DICTATION: .Note written in EPIC  DISPOSITION: PACU  CONDITION: STABLE    BRIEF SUMMARY AND INDICATION FOR PROCEDURE:  Patient is a 88 y.o.-year- old with a tibial plateau fracture, treated provisionally with external fixation, elevation, and active motion of the foot and toes to facilitate resolution of soft tissue swelling.  I did discuss with the patient and her daughter the risks and benefits of surgical treatment including the potential for arthritis, nerve injury, vessel injury, loss of motion, DVT, PE, heart attack, stroke, symptomatic hardware, need for further surgery, and multiple others.  The patient acknowledged these risks and wished to proceed.   BRIEF SUMMARY OF PROCEDURE:  After administration of preoperative antibiotics, the patient was taken to the operating room.  General anesthesia was induced. Scrubbing of the skin and pin sites with chlorhexidine sponge performed, followed by shaving of the surgical field. The lower extremity was prepped and draped in usual sterile fashion using a chlorhexidine wash and betadine  scrub and paint. A timeout was performed.  We did retain the fixator to protect the neurovascular structures during prepping.   A curvilinear incision was made extending laterally over Gerdy's tubercle. Dissection was carried down where the soft tissues were left intact to the lateral plateau and rim, elevated only the insertion of the extensors. I was then able to insert the 9 hole Zimmer NCB tibial plateau plate and pin it proximally at the joint line. I then placed standard lag screw fixation in the proximal row of the plate, which we would later convert to locked fixation by placing locking caps and three shaft screws off of the jig, checking orthogonal fluoro views. Final images confirmed appropriate reduction, implant position and length. All wounds were irrigated thoroughly.   The large knee hemarthrosis was aspirated removing 60 cc of blood.   Once fixation was completed, the leg was isolated from the clamps with towels and the fixator removed.  The ulcerated pin sites, which each measured approximately 10 mm in diameter were then debrided with serial curettes at the skin, subcutaneous tissue, muscle fascia, and cortical bone levels, excising all nonviable, contaminated, and devitalized tissue along the tracts back to healthy pink tissue.  Again, the depth of debridement was from skin to bone.  These were irrigated thoroughly.    Once more, wound was irrigated and then a standard layered closure performed, 0 Vicryl, 2-0 Vicryl, and 3-0 nylon for the skin.  Sterile gently compressive dressing was applied and in knee immobilizer.  The patient was taken to the PACU in stable condition.   PROGNOSIS: The patient will not require bracing and unrestricted range of motion and  this will begin immediately.  Orders entered for nonweightbearing on the operative extremity, pharmacologic DVT prophylaxis with resumption of Lovenox then transitioning to Eliquis for long term prophylaxis, and mobilization  with PT and OT. After discharge, we will plan to see the patient back in about 2 weeks for removal of sutures and we will continue to follow throughout the hospital stay.         [name]. Marcelino Scot, M.D.

## 2022-02-22 NOTE — Progress Notes (Signed)
The risks and benefits of surgery were discussed with the patient and her daughter for right tibial plateau repair, including the possibility of infection, nerve injury, vessel injury, wound breakdown, arthritis, symptomatic hardware, DVT/ PE, loss of motion, malunion, nonunion, and need for further surgery among others. These risks were acknowledged and consent provided to proceed.  Janet Ellston, MD [facility], [facility] [phone]
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
## 2022-02-22 NOTE — Care Management Important Message (Signed)
Important Message  Patient Details  Name: Janet Duffy MRN: [id number] Date of Birth: [birth year]   Medicare Important Message Given:  Yes     Hannah Beat 02/22/2022, 11:37 AM

## 2022-02-22 NOTE — Transfer of Care (Signed)
Immediate Anesthesia Transfer of Care Note  Patient: [facility]  Procedure(s) Performed: OPEN REDUCTION INTERNAL FIXATION (ORIF) TIBIAL PLATEAU (Right: Leg Lower) REMOVAL EXTERNAL FIXATION LEG (Right: Leg Lower)  Patient Location: PACU  Anesthesia Type:General  Level of Consciousness: awake, alert , and oriented  Airway & Oxygen Therapy: Patient Spontanous Breathing and Patient connected to nasal cannula oxygen  Post-op Assessment: Report given to RN, Post -op Vital signs reviewed and stable, and Patient moving all extremities  Post vital signs: Reviewed and stable  Last Vitals:  Vitals Value Taken Time  BP 190/88 02/22/22 2207  Temp    Pulse 87 02/22/22 2213  Resp 15 02/22/22 2213  SpO2 94 % 02/22/22 2213  Vitals shown include unvalidated device data.  Last Pain:  Vitals:   02/22/22 1510  TempSrc: Oral  PainSc:       Patients Stated Pain Goal: 3 (52/84/13 2440)  Complications: No notable events documented.

## 2022-02-22 NOTE — Op Note (Addendum)
7:02 PM 02/22/2022 02/12/2022 - 02/22/2022  PATIENT:  Janet Duffy  88 y.o. female  [id number]  PRE-OPERATIVE DIAGNOSIS:   1. RIGHT BICONDYLAR TIBIAL PLATEAU FRACTURE 2. RETAINED EXTERNAL FIXATOR 3. ULCERATED PIN SITES  POST-OPERATIVE DIAGNOSIS:   1. RIGHT BICONDYLAR TIBIAL PLATEAU FRACTURE 2. RETAINED EXTERNAL FIXATOR 3. ULCERATED PIN SITES  PROCEDURE:   1. ORIF OF RIGHT BICONDYLAR TIBIAL PLATEAU FRACTURE  2. ANTERIOR COMPARTMENT FASCIOTOMY 3. REMOVAL OF EXTERNAL FIXATOR UNDER ANESTHESIA 4. CURETTAGE OF ULCERATED PIN SITES INCLUDING BONE OF TIBIA AND FEMUR 5. APPLICATION OF MANUAL STRESS UNDER FLUOROSCOPY 6. ASPIRATION OF RIGHT KNEE HEMARTHROSIS  SURGEON:  Surgeon(s) and Role:    Altamese [place], MD - Primary  PHYSICIAN ASSISTANT: None.  ANESTHESIA:   general  I/O:  No intake/output data recorded.  SPECIMEN:  None  TOURNIQUET:  None  DICTATION: .Note written in EPIC  DISPOSITION: PACU  CONDITION: STABLE     BRIEF SUMMARY AND INDICATION FOR PROCEDURE:  Patient is a 88 y.o.-year- old with a tibial plateau fracture, treated provisionally with external fixation, elevation, and active motion of the foot and toes to facilitate resolution of soft tissue swelling.  I did discuss with the patient and her daughter the risks and benefits of surgical treatment including the potential for arthritis, nerve injury, vessel injury, loss of motion, DVT, PE, heart attack, stroke, symptomatic hardware, need for further surgery, and multiple others.  The patient acknowledged these risks and wished to proceed.   BRIEF SUMMARY OF PROCEDURE:  After administration of preoperative antibiotics, the patient was taken to the operating room.  General anesthesia was induced. Scrubbing of the skin and pin sites with chlorhexidine sponge performed, followed by shaving of the surgical field. The lower extremity was prepped and draped in usual sterile fashion using a chlorhexidine wash and betadine  scrub and paint. A timeout was performed.  I did retain the fixator to protect the neurovascular structures during prepping.    A curvilinear incision was made extending laterally over Gerdy's tubercle. Dissection was carried down where the soft tissues were left intact to the lateral plateau and rim, elevated only the insertion of the extensors. I was then able to insert the 9 hole Zimmer NCB tibial plateau plate and pin it proximally at the joint line. Going back to the medial side a 5 hole DCP plate was pinned in position to buttress the medial platea. My assistant pulled traction and derotated the fracture while I used the [employer] clamp to achieve reduction. I then placed three bicortical screws distally securing the buttress effect. At this point, we placed standard lag screw fixation in the proximal row of the plate, which we would later convert to locked fixation by placing locking caps. Final images showed appropriate reduction, implant position and length. All wounds were irrigated thoroughly.   Prior to closure, I turned my attention to the distal edge of the wound here underneath the skin.  I used the long scissors to spread both superficial and deep to the anterior compartment.  The fascia was then released for 8 to 10 cm to reduce the likelihood of the postoperative compartment syndrome.  Once more, wound was irrigated and then a standard layered closure performed, 0 Vicryl, 2-0 Vicryl, and 3-0 nylon for the skin.    Lastly, the large knee hemarthrosis was aspirated removing 60 cc of blood.   Once fixation was complete, the leg was isolated from the clamps with towels and the fixator removed.  The ulcerated pin sites,  which each measured approximately 10 mm in diameter were then debrided with serial curettes at the skin, subcutaneous tissue, muscle fascia, and cortical bone levels, excising all nonviable, contaminated, and devitalized tissue along the tracts back to healthy pink  tissue.  Again, the depth of debridement was from skin to bone.  These were irrigated thoroughly. Additional Betadine paint was performed, Ioban used to isolate them from the surgical field, and new gloves obtained by operative staff. Sterile gently compressive dressings were applied and a knee immobilizer.  The patient was taken to the PACU in stable condition.    PROGNOSIS: The patient will not require formal bracing given the stable examination post fixation. Unrestricted range of motion will begin immediately.  She will be nonweightbearing on the operative extremity, be on pharmacologic DVT prophylaxis, and mobilized with PT and OT. After discharge, we will plan to see back in about 2 weeks for removal of sutures. Discharge anticipated in two days.   Janet Duffy. Janet Duffy, M.D.

## 2022-02-22 NOTE — Anesthesia Procedure Notes (Signed)
Procedure Name: Intubation Date/Time: 02/22/2022 7:52 PM  Performed by: Karianna Gusman T, CRNAPre-anesthesia Checklist: Patient identified, Emergency Drugs available, Suction available and Patient being monitored Patient Re-evaluated:Patient Re-evaluated prior to induction Oxygen Delivery Method: Circle system utilized Preoxygenation: Pre-oxygenation with 100% oxygen Induction Type: IV induction and Cricoid Pressure applied Ventilation: Mask ventilation without difficulty Laryngoscope Size: Miller and 2 Grade View: Grade I Tube type: Oral Tube size: 7.5 mm Number of attempts: 1 Airway Equipment and Method: Stylet and Oral airway Placement Confirmation: ETT inserted through vocal cords under direct vision, positive ETCO2 and breath sounds checked- equal and bilateral Secured at: 22 cm Tube secured with: Tape Dental Injury: Teeth and Oropharynx as per pre-operative assessment
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
## 2022-02-23 ENCOUNTER — Encounter (HOSPITAL_COMMUNITY): Payer: Self-pay | Admitting: Orthopedic Surgery

## 2022-02-23 ENCOUNTER — Other Ambulatory Visit: Payer: Self-pay

## 2022-02-23 DIAGNOSIS — E871 Hypo-osmolality and hyponatremia: Secondary | ICD-10-CM

## 2022-02-23 DIAGNOSIS — S82141S Displaced bicondylar fracture of right tibia, sequela: Secondary | ICD-10-CM | POA: Diagnosis not present

## 2022-02-23 DIAGNOSIS — D62 Acute posthemorrhagic anemia: Secondary | ICD-10-CM | POA: Diagnosis not present

## 2022-02-23 LAB — CBC
HCT: 31 % — ABNORMAL LOW (ref 36.0–46.0)
Hemoglobin: 10.8 g/dL — ABNORMAL LOW (ref 12.0–15.0)
MCH: 31.8 pg (ref 26.0–34.0)
MCHC: 34.8 g/dL (ref 30.0–36.0)
MCV: 91.2 fL (ref 80.0–100.0)
Platelets: 298 10*3/uL (ref 150–400)
RBC: 3.4 MIL/uL — ABNORMAL LOW (ref 3.87–5.11)
RDW: 17.5 % — ABNORMAL HIGH (ref 11.5–15.5)
WBC: 8.9 10*3/uL (ref 4.0–10.5)
nRBC: 0 % (ref 0.0–0.2)

## 2022-02-23 LAB — BASIC METABOLIC PANEL
Anion gap: 10 (ref 5–15)
BUN: 16 mg/dL (ref 8–23)
CO2: 24 mmol/L (ref 22–32)
Calcium: 8.8 mg/dL — ABNORMAL LOW (ref 8.9–10.3)
Chloride: 97 mmol/L — ABNORMAL LOW (ref 98–111)
Creatinine, Ser: 0.74 mg/dL (ref 0.44–1.00)
GFR, Estimated: >60 mL/min (ref >60)
Glucose, Bld: 139 mg/dL — ABNORMAL HIGH (ref 70–99)
Potassium: 4.2 mmol/L (ref 3.5–5.1)
Sodium: 131 mmol/L — ABNORMAL LOW (ref 135–145)

## 2022-02-23 MED ORDER — PROCHLORPERAZINE MALEATE 5 MG PO TABS
5.0000 mg | ORAL_TABLET | Freq: Four times a day (QID) | ORAL | Status: DC | PRN
  Administered 2022-02-27 – 2022-03-01 (×3): 5 mg via ORAL
  Filled 2022-02-23 (×4): qty 1

## 2022-02-23 MED ORDER — PROCHLORPERAZINE EDISYLATE 10 MG/2ML IJ SOLN: 5.0000 mg | Freq: Four times a day (QID) | INTRAMUSCULAR | Status: DC | PRN

## 2022-02-23 MED ORDER — PROCHLORPERAZINE EDISYLATE 10 MG/2ML IJ SOLN: 10.0000 mg | Freq: Four times a day (QID) | INTRAMUSCULAR | Status: DC | PRN

## 2022-02-23 MED ORDER — ONDANSETRON HCL 4 MG/2ML IJ SOLN
INTRAMUSCULAR | Status: AC
  Filled 2022-02-23: qty 2

## 2022-02-23 MED ORDER — APIXABAN 2.5 MG PO TABS
2.5000 mg | ORAL_TABLET | Freq: Two times a day (BID) | ORAL | Status: DC
  Administered 2022-02-24 – 2022-03-05 (×19): 2.5 mg via ORAL
  Filled 2022-02-23 (×19): qty 1

## 2022-02-23 MED ORDER — LISINOPRIL 20 MG PO TABS
40.0000 mg | ORAL_TABLET | Freq: Every day | ORAL | Status: DC
  Administered 2022-02-24 – 2022-03-05 (×10): 40 mg via ORAL
  Filled 2022-02-23 (×10): qty 2
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
## 2022-02-23 NOTE — Progress Notes (Signed)
Inpatient Rehab Admissions Coordinator:   Per [facility] request, patient was screened for CIR candidacy by Clemens Catholic, MS, CCC-SLP At this time, Pt. does not appear to demonstrate medical necessity to justify in hospital rehabilitation/CIR. Additionally, PT and OT are both recommending SNF. I will not pursue a rehab consult for this Pt.   Recommend other rehab venues to be pursued.  Please contact me with any questions.  Clemens Catholic, [place], [place] Admissions Coordinator  [phone] ([place]) [phone] (office)

## 2022-02-23 NOTE — Anesthesia Postprocedure Evaluation (Signed)
Anesthesia Post Note  Patient: Janet Duffy  Procedure(s) Performed: OPEN REDUCTION INTERNAL FIXATION (ORIF) TIBIAL PLATEAU (Right: Leg Lower) REMOVAL EXTERNAL FIXATION LEG (Right: Leg Lower)     Patient location during evaluation: PACU Anesthesia Type: General Level of consciousness: awake Pain management: pain level controlled Vital Signs Assessment: post-procedure vital signs reviewed and stable Respiratory status: spontaneous breathing Cardiovascular status: stable Postop Assessment: no apparent nausea or vomiting Anesthetic complications: no   No notable events documented.  Last Vitals:  Vitals:   02/22/22 2245 02/22/22 2315  BP: (!) 179/89 (!) 177/89  Pulse: 75 78  Resp: 11 (!) 8  Temp:  36.7 C  SpO2: 95% 96%    Last Pain:  Vitals:   02/22/22 2315  TempSrc:   PainSc: 5                  Shaden Lacher

## 2022-02-23 NOTE — Progress Notes (Signed)
Orthopedic Tech Progress Note Patient Details:  Janet Duffy 1931/10/05 [id number] Bone Foam was applied to the rle by Daye  Ortho Devices Type of Ortho Device: Bone foam zero knee Ortho Device/Splint Location: RLE Ortho Device/Splint Interventions: Application   Post Interventions Patient Tolerated: Well Instructions Provided: Care of device  Halo Laski E Appollonia Klee 02/23/2022, 9:47 AM

## 2022-02-23 NOTE — Progress Notes (Signed)
FMTS Brief Progress Note  S: Postoperative exam.  Patient found in room with nurse and daughter [name] at bedside.  Daughter reports patient is still "a little out of it" from anesthesia.  Patient reports quite a bit of pain from the surgical site itself, no pain elsewhere.  Also quite nauseous after anesthesia.  RN has administered p.o. Compazine, patient has kept it down.  Daughter reports patient has never thrown up in her life because she has a large hiatal hernia and cannot vomit.  Daughter reports patient is hesitant to take painkillers because she does not want to become addicted.  O: BP (!) 177/89 (BP Location: Left Arm)   Pulse 78   Temp 98 F (36.7 C)   Resp (!) 8   Ht '5\' 3"'$  (1.6 m)   Wt 55.7 kg   SpO2 96%   BMI 21.75 kg/m   General: Awake, alert, no acute distress but uncomfortable appearing Cardiac: Regular rate and rhythm, no murmurs Respiratory: Clear anteriorly Extremities: Right lower extremity wrapped with Ace bandage from midfoot to mid thigh, able to wiggle toes, toes are warm to touch, bandages appear clean and dry  A/P: Right tibial plateau fracture s/p ORIF Patient received operation this evening.  Still nauseous and in pain after surgery.  Current orders include Tylenol, morphine, oxycodone IR, and Compazine. -Scheduled: Tylenol 1000 mg every 6 hours - As needed: Oxy IR 5 mg every 6 PRN, morphine 2 mg IV every 4 PRN, Compazine 5 mg PI or IV every 6 PRN  Hypertension Patient continues to be hypertensive after surgery, most recent 177/89 at 2315.  Attributable to pain, I am hopeful that she become more normotensive with better pain control.  No red flags or neurological deficits. -Pain control as above   Ezequiel Essex, MD 02/23/2022, 1:24 AM PGY-3, [facility] Family Medicine Night Resident  Please page [phone] with questions.

## 2022-02-23 NOTE — TOC Progression Note (Signed)
Transition of Care [facility]) - Progression Note    Patient Details  Name: Janet Duffy MRN: [id number] Date of Birth: [birth year]  Transition of Care [facility]) CM/SW Contact  Joanne Chars, [place] Phone Number: 02/23/2022, 2:24 PM  Clinical Narrative:   CSW spoke with pt, confirmed plan for SNF, choice document provided.  First choice is [facility]. CSW spoke with daughter [name] who is also requesting that CIR be considered.  Discussed [place] medicare has a restricted list of SNF options.    Referral sent out in hub for SNF.    Expected Discharge Plan: [place] Barriers to Discharge: Continued Medical Work up, SNF Pending bed offer, Other (must enter comment) (surgery on 11/21)  Expected Discharge Plan and Services Expected Discharge Plan: [place] In-house Referral: Clinical Social Work   Post Acute Care Choice: [place] Living arrangements for the past 2 months: Single Family Home                                       Social Determinants of Health (SDOH) Interventions    Readmission Risk Interventions     No data to display
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
## 2022-02-23 NOTE — Progress Notes (Signed)
Pt is sleep on assessment. Will wait on pain meds.

## 2022-02-23 NOTE — Progress Notes (Signed)
Mobility Specialist Progress Note   02/23/22 1755  Mobility  Activity Transferred from chair to bed  Level of Assistance +2 (takes two people) Education officer, museum)  Assistive Device Other (Comment) (HHA)  Distance Ambulated (ft) 2 ft  RLE Weight Bearing NWB  Activity Response Tolerated well  $Mobility charge 1 Mobility   Pt requesting assistance to get from chair to bed d/t increasing discomfort in chair. Required +2A minA to stand and pivot pt to EOB, pt adhering to WB precautions well and transferred w/o fault. Pt left in bed with all needs met and RN's present in room.   Holland Falling Mobility Specialist Acute Rehab Office:  [phone]
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
## 2022-02-24 ENCOUNTER — Encounter (HOSPITAL_COMMUNITY): Payer: Self-pay | Admitting: Orthopedic Surgery

## 2022-02-24 DIAGNOSIS — S82141A Displaced bicondylar fracture of right tibia, initial encounter for closed fracture: Secondary | ICD-10-CM

## 2022-02-24 LAB — BASIC METABOLIC PANEL
Anion gap: 12 (ref 5–15)
BUN: 26 mg/dL — ABNORMAL HIGH (ref 8–23)
CO2: 24 mmol/L (ref 22–32)
Calcium: 9 mg/dL (ref 8.9–10.3)
Chloride: 97 mmol/L — ABNORMAL LOW (ref 98–111)
Creatinine, Ser: 0.96 mg/dL (ref 0.44–1.00)
GFR, Estimated: 57 mL/min — ABNORMAL LOW (ref >60)
Glucose, Bld: 109 mg/dL — ABNORMAL HIGH (ref 70–99)
Potassium: 3.9 mmol/L (ref 3.5–5.1)
Sodium: 133 mmol/L — ABNORMAL LOW (ref 135–145)

## 2022-02-24 LAB — CBC
HCT: 26.6 % — ABNORMAL LOW (ref 36.0–46.0)
Hemoglobin: 9 g/dL — ABNORMAL LOW (ref 12.0–15.0)
MCH: 31.3 pg (ref 26.0–34.0)
MCHC: 33.8 g/dL (ref 30.0–36.0)
MCV: 92.4 fL (ref 80.0–100.0)
Platelets: 278 10*3/uL (ref 150–400)
RBC: 2.88 MIL/uL — ABNORMAL LOW (ref 3.87–5.11)
RDW: 17 % — ABNORMAL HIGH (ref 11.5–15.5)
WBC: 9.5 10*3/uL (ref 4.0–10.5)
nRBC: 0 % (ref 0.0–0.2)

## 2022-02-24 NOTE — Plan of Care (Signed)
  Problem: Education: Goal: Knowledge of General Education information will improve Description Including pain rating scale, medication(s)/side effects and non-pharmacologic comfort measures Outcome: Progressing   Problem: Nutrition: Goal: Adequate nutrition will be maintained Outcome: Progressing   Problem: Pain Managment: Goal: General experience of comfort will improve Outcome: Progressing   

## 2022-02-24 NOTE — Plan of Care (Signed)

## 2022-02-24 NOTE — Assessment & Plan Note (Deleted)
Resolved, now 135.

## 2022-02-24 NOTE — Progress Notes (Signed)
Mobility Specialist Progress Note   02/24/22 1446  Mobility  Activity Transferred from bed to chair  Level of Assistance Moderate assist, patient does 50-74%  Assistive Device Other (Comment) (HHA)  RLE Weight Bearing NWB  Activity Response Tolerated well  $Mobility charge 1 Mobility   Pt limited by pain this session but following cues well. MinA to get RLE and trunk to EOB. Pt adhering to WB precautions well when doing SPT to chair, modA for physical assistance. No faults during transfer left w/ call bell in reach and needs met.   Holland Falling Mobility Specialist Acute Rehab Office:  [phone]
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
## 2022-02-25 DIAGNOSIS — S82101A Unspecified fracture of upper end of right tibia, initial encounter for closed fracture: Secondary | ICD-10-CM | POA: Diagnosis not present

## 2022-02-25 LAB — BASIC METABOLIC PANEL
Anion gap: 10 (ref 5–15)
BUN: 20 mg/dL (ref 8–23)
CO2: 27 mmol/L (ref 22–32)
Calcium: 9.3 mg/dL (ref 8.9–10.3)
Chloride: 98 mmol/L (ref 98–111)
Creatinine, Ser: 0.82 mg/dL (ref 0.44–1.00)
GFR, Estimated: >60 mL/min (ref >60)
Glucose, Bld: 100 mg/dL — ABNORMAL HIGH (ref 70–99)
Potassium: 4.2 mmol/L (ref 3.5–5.1)
Sodium: 135 mmol/L (ref 135–145)

## 2022-02-25 LAB — CBC
HCT: 26 % — ABNORMAL LOW (ref 36.0–46.0)
Hemoglobin: 8.7 g/dL — ABNORMAL LOW (ref 12.0–15.0)
MCH: 31.5 pg (ref 26.0–34.0)
MCHC: 33.5 g/dL (ref 30.0–36.0)
MCV: 94.2 fL (ref 80.0–100.0)
Platelets: 282 10*3/uL (ref 150–400)
RBC: 2.76 MIL/uL — ABNORMAL LOW (ref 3.87–5.11)
RDW: 17 % — ABNORMAL HIGH (ref 11.5–15.5)
WBC: 6.5 10*3/uL (ref 4.0–10.5)
nRBC: 0 % (ref 0.0–0.2)
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
## 2022-02-25 NOTE — Plan of Care (Signed)
  Problem: Education: Goal: Knowledge of General Education information will improve Description: Including pain rating scale, medication(s)/side effects and non-pharmacologic comfort measures Outcome: Progressing   Problem: Health Behavior/Discharge Planning: Goal: Ability to manage health-related needs will improve Outcome: Progressing   Problem: Clinical Measurements: Goal: Ability to maintain clinical measurements within normal limits will improve Outcome: Progressing Goal: Will remain free from infection Outcome: Progressing Goal: Diagnostic test results will improve Outcome: Progressing Goal: Respiratory complications will improve Outcome: Progressing Goal: Cardiovascular complication will be avoided Outcome: Progressing   Problem: Coping: Goal: Level of anxiety will decrease Outcome: Progressing   Problem: Elimination: Goal: Will not experience complications related to bowel motility Outcome: Progressing Goal: Will not experience complications related to urinary retention Outcome: Progressing   Problem: Pain Managment: Goal: General experience of comfort will improve Outcome: Progressing   Problem: Safety: Goal: Ability to remain free from injury will improve Outcome: Progressing   

## 2022-02-25 NOTE — Progress Notes (Signed)
FMTS Interim Progress Note  As patient is approaching discharge to SNF, [facility] consulted for SNF placement needs. Also contacted orthopedic trauma specialists to ensure that there were no further recommendations prior to this patient's discharge and recommended follow up outpatient. Spoke with Dr Zachery Dakins who confirms that patient is stable for discharge from the orthopedics standpoint and recommends follow up outpatient with Dr. Marcelino Scot around 12/5. Appreciate involvement of orthopedics team.   Donney Dice, DO 02/25/2022, 11:38 AM PGY-3, [facility] pager [phone]

## 2022-02-26 DIAGNOSIS — S82101D Unspecified fracture of upper end of right tibia, subsequent encounter for closed fracture with routine healing: Secondary | ICD-10-CM

## 2022-02-26 LAB — BASIC METABOLIC PANEL
Anion gap: 11 (ref 5–15)
BUN: 16 mg/dL (ref 8–23)
CO2: 25 mmol/L (ref 22–32)
Calcium: 9.4 mg/dL (ref 8.9–10.3)
Chloride: 99 mmol/L (ref 98–111)
Creatinine, Ser: 0.75 mg/dL (ref 0.44–1.00)
GFR, Estimated: >60 mL/min (ref >60)
Glucose, Bld: 100 mg/dL — ABNORMAL HIGH (ref 70–99)
Potassium: 4 mmol/L (ref 3.5–5.1)
Sodium: 135 mmol/L (ref 135–145)

## 2022-02-26 LAB — CBC
HCT: 30.2 % — ABNORMAL LOW (ref 36.0–46.0)
Hemoglobin: 10 g/dL — ABNORMAL LOW (ref 12.0–15.0)
MCH: 31.6 pg (ref 26.0–34.0)
MCHC: 33.1 g/dL (ref 30.0–36.0)
MCV: 95.6 fL (ref 80.0–100.0)
Platelets: 296 10*3/uL (ref 150–400)
RBC: 3.16 MIL/uL — ABNORMAL LOW (ref 3.87–5.11)
RDW: 16.5 % — ABNORMAL HIGH (ref 11.5–15.5)
WBC: 6.1 10*3/uL (ref 4.0–10.5)
nRBC: 0 % (ref 0.0–0.2)

## 2022-02-26 MED ORDER — HYDRALAZINE HCL 50 MG PO TABS
50.0000 mg | ORAL_TABLET | Freq: Two times a day (BID) | ORAL | Status: DC
  Administered 2022-02-26 – 2022-02-28 (×4): 50 mg via ORAL
  Filled 2022-02-26 (×4): qty 1
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
## 2022-02-26 NOTE — TOC Progression Note (Signed)
Transition of Care [facility]) - Progression Note    Patient Details  Name: Janet Duffy MRN: [id number] Date of Birth: [birth year]  Transition of Care [facility]) CM/SW Contact  Ina Homes, [place] Phone Number: 02/26/2022, 9:24 AM  Clinical Narrative:     No bed offers at this time.  Expected Discharge Plan: [place] Barriers to Discharge: Continued Medical Work up, SNF Pending bed offer, Other (must enter comment) (surgery on 11/21)  Expected Discharge Plan and Services Expected Discharge Plan: [place] In-house Referral: Clinical Social Work   Post Acute Care Choice: [place] Living arrangements for the past 2 months: Single Family Home                                       Social Determinants of Health (SDOH) Interventions    Readmission Risk Interventions     No data to display
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
## 2022-02-27 DIAGNOSIS — S82101D Unspecified fracture of upper end of right tibia, subsequent encounter for closed fracture with routine healing: Secondary | ICD-10-CM | POA: Diagnosis not present

## 2022-02-27 LAB — CBC
HCT: 28.7 % — ABNORMAL LOW (ref 36.0–46.0)
Hemoglobin: 9.3 g/dL — ABNORMAL LOW (ref 12.0–15.0)
MCH: 31.1 pg (ref 26.0–34.0)
MCHC: 32.4 g/dL (ref 30.0–36.0)
MCV: 96 fL (ref 80.0–100.0)
Platelets: 314 10*3/uL (ref 150–400)
RBC: 2.99 MIL/uL — ABNORMAL LOW (ref 3.87–5.11)
RDW: 16.7 % — ABNORMAL HIGH (ref 11.5–15.5)
WBC: 5.4 10*3/uL (ref 4.0–10.5)
nRBC: 0 % (ref 0.0–0.2)
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
## 2022-02-28 LAB — CBC
HCT: 29.7 % — ABNORMAL LOW (ref 36.0–46.0)
Hemoglobin: 9.7 g/dL — ABNORMAL LOW (ref 12.0–15.0)
MCH: 31.5 pg (ref 26.0–34.0)
MCHC: 32.7 g/dL (ref 30.0–36.0)
MCV: 96.4 fL (ref 80.0–100.0)
Platelets: 336 10*3/uL (ref 150–400)
RBC: 3.08 MIL/uL — ABNORMAL LOW (ref 3.87–5.11)
RDW: 16.8 % — ABNORMAL HIGH (ref 11.5–15.5)
WBC: 5 10*3/uL (ref 4.0–10.5)
nRBC: 0 % (ref 0.0–0.2)

## 2022-02-28 MED ORDER — SENNA 8.6 MG PO TABS
1.0000 | ORAL_TABLET | Freq: Every day | ORAL | Status: DC
  Administered 2022-03-02 – 2022-03-05 (×4): 8.6 mg via ORAL
  Filled 2022-02-28 (×5): qty 1

## 2022-02-28 MED ORDER — CHLORTHALIDONE 25 MG PO TABS
12.5000 mg | ORAL_TABLET | Freq: Every day | ORAL | Status: DC
  Administered 2022-02-28 – 2022-03-02 (×3): 12.5 mg via ORAL
  Filled 2022-02-28 (×4): qty 0.5

## 2022-02-28 MED ORDER — POLYETHYLENE GLYCOL 3350 17 G PO PACK
17.0000 g | PACK | Freq: Every day | ORAL | Status: DC
  Filled 2022-02-28 (×4): qty 1
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
## 2022-02-28 NOTE — Plan of Care (Signed)
  Problem: Education: Goal: Knowledge of General Education information will improve Description: Including pain rating scale, medication(s)/side effects and non-pharmacologic comfort measures Outcome: Progressing   Problem: Safety: Goal: Ability to remain free from injury will improve Outcome: Progressing   Problem: Elimination: Goal: Will not experience complications related to bowel motility Outcome: Adequate for Discharge Goal: Will not experience complications related to urinary retention Outcome: Adequate for Discharge   Problem: Pain Managment: Goal: General experience of comfort will improve Outcome: Adequate for Discharge
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
## 2022-02-28 NOTE — Progress Notes (Signed)
Mobility Specialist Progress Note   02/28/22 1501  Mobility  Activity Transferred to/from [facility]  Level of Assistance Minimal assist, patient does 75% or more  Assistive Device Front wheel walker  Distance Ambulated (ft) 2 ft  RLE Weight Bearing NWB  Activity Response Tolerated well  $Mobility charge 1 Mobility   Received pt in chair requesting to use the BSC. Able to stand w/ minA while adhering to WB precautions. Pt also able to pivot and turn towards BSC but requiring VC on the RW's proximity to pt. Successful void. Able to stand and take one hop step backwards towards bed but requesting to have the bed pulled the rest of the way. Placed back in bed w/ minA d/t decreased pain tolerance on RLE, call bell placed in reach.   Holland Falling Mobility Specialist Acute Rehab Office:  [phone]
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
## 2022-02-28 NOTE — Care Management Important Message (Signed)
Important Message  Patient Details  Name: Janet Duffy MRN: [id number] Date of Birth: 10-09-1931   Medicare Important Message Given:  Yes     Hannah Beat 02/28/2022, 1:09 PM

## 2022-03-01 DIAGNOSIS — S82101A Unspecified fracture of upper end of right tibia, initial encounter for closed fracture: Secondary | ICD-10-CM | POA: Diagnosis not present
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
## 2022-03-01 NOTE — Progress Notes (Signed)
Mobility Specialist Progress Note   03/01/22 1700  Mobility  Activity Transferred to/from BSC  Level of Assistance Minimal assist, patient does 75% or more  Assistive Device Front wheel walker  Distance Ambulated (ft) 2 ft  RLE Weight Bearing NWB  Activity Response Tolerated well  $Mobility charge 1 Mobility   Pt requesting assistance to get from chair to BSC d/t void urgency. Required MinA throughout with use of RW to steady pt while maintaining WB precaution. MinA for pericare after a successful void and to get pt back in bed d/t decreased pain tolerance. Pt left supine with all needs met, and call bell in reach.  Jeremiah Hedrick Mobility Specialist Please contact via SecureChat or  Rehab office at [phone]  
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
## 2022-03-01 NOTE — TOC Progression Note (Signed)
Transition of Care [facility]) - Progression Note    Patient Details  Name: Janet Duffy MRN: [id number] Date of Birth: Jun 06, 1931  Transition of [facility]) CM/SW Contact  Joanne Chars, [place] Phone Number: 03/01/2022, 3:41 PM  Clinical Narrative:    CS[facility] with [facility]--they do not have any available beds at this time.  CSW spoke with [name] several times during the day. Daughter requested CSW see if [facility] could provide STR.  CSW went on website and STR is provided at [place] location in [place], none locally.   CSW called [employer] provider line, spent some time with representative attempting to locate SNF in [place].  Rep was not able to locate any provider.  CSW met with pt and daughter again at 1540, updated on all the above.  Daughter will visit [facility] tomorrow.    Expected Discharge Plan: [place] Barriers to Discharge: Continued Medical Work up, SNF Pending bed offer, Other (must enter comment) (surgery on 11/21)  Expected Discharge Plan and Services Expected Discharge Plan: [place] In-house Referral: Clinical Social Work   Post Acute Care Choice: [place] Living arrangements for the past 2 months: Single Family Home                                       Social Determinants of Health (SDOH) Interventions    Readmission Risk Interventions     No data to display
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
## 2022-03-02 DIAGNOSIS — S82101A Unspecified fracture of upper end of right tibia, initial encounter for closed fracture: Secondary | ICD-10-CM | POA: Diagnosis not present

## 2022-03-02 LAB — CBC
HCT: 30.4 % — ABNORMAL LOW (ref 36.0–46.0)
Hemoglobin: 9.7 g/dL — ABNORMAL LOW (ref 12.0–15.0)
MCH: 31.2 pg (ref 26.0–34.0)
MCHC: 31.9 g/dL (ref 30.0–36.0)
MCV: 97.7 fL (ref 80.0–100.0)
Platelets: 308 10*3/uL (ref 150–400)
RBC: 3.11 MIL/uL — ABNORMAL LOW (ref 3.87–5.11)
RDW: 17.1 % — ABNORMAL HIGH (ref 11.5–15.5)
WBC: 4.1 10*3/uL (ref 4.0–10.5)
nRBC: 0 % (ref 0.0–0.2)

## 2022-03-02 LAB — BASIC METABOLIC PANEL
Anion gap: 7 (ref 5–15)
BUN: 13 mg/dL (ref 8–23)
CO2: 26 mmol/L (ref 22–32)
Calcium: 8.9 mg/dL (ref 8.9–10.3)
Chloride: 98 mmol/L (ref 98–111)
Creatinine, Ser: 0.86 mg/dL (ref 0.44–1.00)
GFR, Estimated: >60 mL/min (ref >60)
Glucose, Bld: 102 mg/dL — ABNORMAL HIGH (ref 70–99)
Potassium: 3.1 mmol/L — ABNORMAL LOW (ref 3.5–5.1)
Sodium: 131 mmol/L — ABNORMAL LOW (ref 135–145)

## 2022-03-02 MED ORDER — CHLORTHALIDONE 25 MG PO TABS: 25.0000 mg | ORAL_TABLET | Freq: Every day | ORAL | Status: DC

## 2022-03-02 MED ORDER — POTASSIUM CHLORIDE 20 MEQ PO PACK: 40.0000 meq | PACK | Freq: Two times a day (BID) | ORAL | Status: DC

## 2022-03-02 MED ORDER — CHLORTHALIDONE 25 MG PO TABS
12.5000 mg | ORAL_TABLET | Freq: Every day | ORAL | Status: DC
  Administered 2022-03-03 – 2022-03-05 (×3): 12.5 mg via ORAL
  Filled 2022-03-02 (×3): qty 0.5

## 2022-03-02 MED ORDER — POTASSIUM CHLORIDE 20 MEQ PO PACK
40.0000 meq | PACK | Freq: Once | ORAL | Status: AC
  Administered 2022-03-02: 40 meq via ORAL
  Filled 2022-03-02: qty 2
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
## 2022-03-02 NOTE — TOC Progression Note (Addendum)
Transition of Care [facility]) - Progression Note    Patient Details  Name: Calayah Guadarrama MRN: [id number] Date of Birth: 03-19-1932  Transition of Care [facility]) CM/SW Contact  Joanne Chars, LCSW Phone Number: 03/02/2022, 11:35 AM  Clinical Narrative:   CSW spoke with pt daughter [name].  Her appt to see [facility] is at 2pm today.  She will call afterwards.   1500: TC daughter [name].  Did not like [facility] at all.  Asked for address for [place] and for [facility].  Expected Discharge Plan: [place] Barriers to Discharge: Continued Medical Work up, SNF Pending bed offer, Other (must enter comment) (surgery on 11/21)  Expected Discharge Plan and Services Expected Discharge Plan: [place] In-house Referral: Clinical Social Work   Post Acute Care Choice: [place] Living arrangements for the past 2 months: Single Family Home                                       Social Determinants of Health (SDOH) Interventions    Readmission Risk Interventions     No data to display
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
## 2022-03-02 NOTE — Plan of Care (Signed)
  Problem: Education: Goal: Knowledge of General Education information will improve Description: Including pain rating scale, medication(s)/side effects and non-pharmacologic comfort measures Outcome: Progressing   Problem: Clinical Measurements: Goal: Ability to maintain clinical measurements within normal limits will improve Outcome: Progressing Goal: Will remain free from infection Outcome: Progressing   Problem: Coping: Goal: Level of anxiety will decrease Outcome: Progressing   Problem: Elimination: Goal: Will not experience complications related to bowel motility Outcome: Progressing   Problem: Pain Managment: Goal: General experience of comfort will improve Outcome: Progressing

## 2022-03-03 DIAGNOSIS — S82101A Unspecified fracture of upper end of right tibia, initial encounter for closed fracture: Secondary | ICD-10-CM | POA: Diagnosis not present

## 2022-03-03 NOTE — Progress Notes (Signed)
Mobility Specialist Progress Note   03/03/22 1058  Mobility  Activity  (Seated Exercises)  Level of Assistance Standby assist, set-up cues, supervision of patient - no hands on  Assistive Device Other (Comment) (HHA)  Range of Motion/Exercises Right leg;Left leg  RLE Weight Bearing NWB  Activity Response Tolerated well  $Mobility charge 1 Mobility   Received in chair stating pain in RLE was 3/10, pt agreeable to mobility. Able to tolerate seated level exercises w/o incident. As session progressed so did pt's pain tolerance and ROM. Remained in chair w/ call bell in reach and no further needs.  Holland Falling Mobility Specialist Please contact via SecureChat or  Rehab office at [phone]
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
## 2022-03-03 NOTE — TOC Progression Note (Addendum)
Transition of Care [facility]) - Progression Note    Patient Details  Name: Janet Duffy MRN: [id number] Date of Birth: 22-Nov-1931  Transition of Care [facility]) CM/SW Contact  Joanne Chars, LCSW Phone Number: 03/03/2022, 8:30 AM  Clinical Narrative:   [facility] cannot offer a bed.    CSW spoke with pt daughter, updated her.  She has tour scheduled at [facility] [place].  She was not able to see [facility] yesterday but can go there directly afterwards.  Per daughter, they do not have funds for [facility] aide to pursue plan to take pt home but are talking to attorney about anything that could be done from that end to pay for [facility] aide.  Discussed need to identify plan today.  CSW spoke with [name].  They do have covid outbreak in the building, but she will call daughter now and could do a tour at anytime today.   1220: TC from Beth/daughter.  They will accept bed offer from [facility].  CSW notified [name], who will initiate auth.      Expected Discharge Plan: [place] Barriers to Discharge: Continued Medical Work up, SNF Pending bed offer, Other (must enter comment) (surgery on 11/21)  Expected Discharge Plan and Services Expected Discharge Plan: [place] In-house Referral: Clinical Social Work   Post Acute Care Choice: [place] Living arrangements for the past 2 months: Single Family Home                                       Social Determinants of Health (SDOH) Interventions    Readmission Risk Interventions     No data to display

## 2022-03-04 DIAGNOSIS — S82101A Unspecified fracture of upper end of right tibia, initial encounter for closed fracture: Secondary | ICD-10-CM | POA: Diagnosis not present

## 2022-03-04 LAB — CBC
HCT: 29.7 % — ABNORMAL LOW (ref 36.0–46.0)
Hemoglobin: 10 g/dL — ABNORMAL LOW (ref 12.0–15.0)
MCH: 32.1 pg (ref 26.0–34.0)
MCHC: 33.7 g/dL (ref 30.0–36.0)
MCV: 95.2 fL (ref 80.0–100.0)
Platelets: 286 10*3/uL (ref 150–400)
RBC: 3.12 MIL/uL — ABNORMAL LOW (ref 3.87–5.11)
RDW: 17.2 % — ABNORMAL HIGH (ref 11.5–15.5)
WBC: 3.9 10*3/uL — ABNORMAL LOW (ref 4.0–10.5)
nRBC: 0 % (ref 0.0–0.2)

## 2022-03-04 LAB — BASIC METABOLIC PANEL
Anion gap: 8 (ref 5–15)
BUN: 13 mg/dL (ref 8–23)
CO2: 26 mmol/L (ref 22–32)
Calcium: 9.2 mg/dL (ref 8.9–10.3)
Chloride: 99 mmol/L (ref 98–111)
Creatinine, Ser: 0.8 mg/dL (ref 0.44–1.00)
GFR, Estimated: >60 mL/min (ref >60)
Glucose, Bld: 105 mg/dL — ABNORMAL HIGH (ref 70–99)
Potassium: 3.3 mmol/L — ABNORMAL LOW (ref 3.5–5.1)
Sodium: 133 mmol/L — ABNORMAL LOW (ref 135–145)

## 2022-03-04 MED ORDER — POTASSIUM CHLORIDE 20 MEQ PO PACK
40.0000 meq | PACK | Freq: Two times a day (BID) | ORAL | Status: AC
  Administered 2022-03-04 (×2): 40 meq via ORAL
  Filled 2022-03-04 (×2): qty 2
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
## 2022-03-04 NOTE — TOC Progression Note (Addendum)
Transition of Care [facility]) - Progression Note    Patient Details  Name: Janet Duffy MRN: [id number] Date of Birth: [birth year]  [facility] CM/SW Contact  Joanne Chars, LCSW Phone Number: 03/04/2022, 11:14 AM  Clinical Narrative:   TC [name].  They do have auth approval but, due to covid in the building, will not have a room available until tomorrow.  Martin Majestic will be the contact for DC Saturday.  MD notified.   LM with daughter [name] with this update.    Expected Discharge Plan: [place] Barriers to Discharge: Continued Medical Work up, SNF Pending bed offer, Other (must enter comment) (surgery on 11/21)  Expected Discharge Plan and Services Expected Discharge Plan: [place] In-house Referral: Clinical Social Work   Post Acute Care Choice: [place] Living arrangements for the past 2 months: Single Family Home                                       Social Determinants of Health (SDOH) Interventions    Readmission Risk Interventions     No data to display

## 2022-03-04 NOTE — Progress Notes (Signed)
Mobility Specialist Progress Note   03/04/22 1200  Mobility  Activity Ambulated with assistance in room  Level of Assistance Minimal assist, patient does 75% or more  Assistive Device Front wheel walker  Distance Ambulated (ft) 6 ft  RLE Weight Bearing NWB  Activity Response Tolerated well  $Mobility charge 1 Mobility   Received pt in chair w/ minimal pain in RLE and agreeable. Pt able to stand and take three forward and three backwards steps w/ minA via hop gait + showing good adherence to NWB status on RLE. Pt fatiguing very quickly and requesting to sit down and rest for awhile, left w/ call bell in reach and all needs met.  Holland Falling Mobility Specialist Please contact via SecureChat or  Rehab office at [phone]

## 2022-03-04 NOTE — Assessment & Plan Note (Signed)
K 3.3. -Klor-Con packet 40 mEq twice daily x 2 doses -A.m. BMP
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
## 2022-03-05 DIAGNOSIS — S82101A Unspecified fracture of upper end of right tibia, initial encounter for closed fracture: Secondary | ICD-10-CM | POA: Diagnosis not present

## 2022-03-05 LAB — BASIC METABOLIC PANEL
Anion gap: 10 (ref 5–15)
BUN: 12 mg/dL (ref 8–23)
CO2: 25 mmol/L (ref 22–32)
Calcium: 9.9 mg/dL (ref 8.9–10.3)
Chloride: 100 mmol/L (ref 98–111)
Creatinine, Ser: 0.74 mg/dL (ref 0.44–1.00)
GFR, Estimated: >60 mL/min (ref >60)
Glucose, Bld: 111 mg/dL — ABNORMAL HIGH (ref 70–99)
Potassium: 3.7 mmol/L (ref 3.5–5.1)
Sodium: 135 mmol/L (ref 135–145)

## 2022-03-05 MED ORDER — CHLORTHALIDONE 25 MG PO TABS: 12.5000 mg | ORAL_TABLET | Freq: Every day | ORAL | Status: AC

## 2022-03-05 MED ORDER — APIXABAN 2.5 MG PO TABS: 2.5000 mg | ORAL_TABLET | Freq: Two times a day (BID) | ORAL | Status: AC

## 2022-03-05 NOTE — TOC Transition Note (Addendum)
Transition of Care [facility]) - CM/SW Discharge Note   Patient Details  Name: Janet Duffy MRN: [id number] Date of Birth: 1931-12-22  Transition of [facility]) CM/SW Contact:  Elliot Gurney Tucker, [place] Phone Number: 03/05/2022, 9:56 AM   Clinical Narrative:    Patient to [facility] to [facility]. Patient to be transported by [facility]. Patient will go to room 207. Report to be called in to [place] , [phone].  12:55 PM Ptar contacted for Transport to [facility] and [place] Transition of Care       Barriers to Discharge: Continued Medical Work up, SNF Pending bed offer, Other (must enter comment) (surgery on 11/21)   Patient Goals and CMS Choice Patient states their goals for this hospitalization and ongoing recovery are:: back to being independent      Discharge Placement                       Discharge Plan and Services In-house Referral: Clinical Social Work   Post Acute Care Choice: [place]                               Social Determinants of Health (SDOH) Interventions     Readmission Risk Interventions     No data to display

## 2022-03-05 NOTE — Plan of Care (Signed)

## 2022-03-05 NOTE — Progress Notes (Signed)
Mobility Specialist: Progress Note   03/05/22 1138  Mobility  Activity Transferred to/from [facility]  Level of Assistance Minimal assist, patient does 75% or more  Assistive Device Front wheel walker  Distance Ambulated (ft) 8 ft (2'+2'+4')  RLE Weight Bearing NWB  Activity Response Tolerated well  Mobility Referral Yes  $Mobility charge 1 Mobility   Pt received in the chair and agreeable to mobility. MinA to stand. Pt transferred to the bed and then to the [facility] per request, void successful. Pt able to maintain NWB status well. Encouraged pt to attempt further ambulation but pt declining. Pt back to the chair after session with call bell in her lap.   [place] Randen Kauth Mobility Specialist Please contact via SecureChat or Rehab office at [phone]

## 2022-03-05 NOTE — Plan of Care (Signed)
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
## 2022-03-05 NOTE — Plan of Care (Addendum)
Patient educated. Copy of discharge paperwork placed in the folder for facility along with patient daughter at bedside requested one. Patient has no further questions. Bilateral IVs removed.  1305: attempt to call report to [facility] 1313: second attempt to call report, left name and number with from desk    Problem: Education: Goal: Knowledge of General Education information will improve Description: Including pain rating scale, medication(s)/side effects and non-pharmacologic comfort measures 03/05/2022 1307 by Trixie Deis, RN Outcome: Adequate for Discharge 03/05/2022 0936 by Trixie Deis, RN Outcome: Progressing   Problem: Health Behavior/Discharge Planning: Goal: Ability to manage health-related needs will improve Outcome: Adequate for Discharge   Problem: Clinical Measurements: Goal: Ability to maintain clinical measurements within normal limits will improve Outcome: Adequate for Discharge Goal: Will remain free from infection Outcome: Adequate for Discharge Goal: Diagnostic test results will improve Outcome: Adequate for Discharge Goal: Respiratory complications will improve Outcome: Adequate for Discharge Goal: Cardiovascular complication will be avoided Outcome: Adequate for Discharge   Problem: Activity: Goal: Risk for activity intolerance will decrease 03/05/2022 1307 by Trixie Deis, RN Outcome: Adequate for Discharge 03/05/2022 0936 by Trixie Deis, RN Outcome: Progressing   Problem: Nutrition: Goal: Adequate nutrition will be maintained Outcome: Adequate for Discharge   Problem: Coping: Goal: Level of anxiety will decrease Outcome: Adequate for Discharge   Problem: Elimination: Goal: Will not experience complications related to bowel motility Outcome: Adequate for Discharge Goal: Will not experience complications related to urinary retention Outcome: Adequate for Discharge   Problem: Pain Managment: Goal: General experience of comfort  will improve 03/05/2022 1307 by Trixie Deis, RN Outcome: Adequate for Discharge 03/05/2022 0936 by Trixie Deis, RN Outcome: Progressing   Problem: Safety: Goal: Ability to remain free from injury will improve 03/05/2022 1307 by Trixie Deis, RN Outcome: Adequate for Discharge 03/05/2022 0936 by Trixie Deis, RN Outcome: Progressing   Problem: Skin Integrity: Goal: Risk for impaired skin integrity will decrease 03/05/2022 1307 by Trixie Deis, RN Outcome: Adequate for Discharge 03/05/2022 0936 by Trixie Deis, RN Outcome: Progressing

## 2022-08-03 ENCOUNTER — Other Ambulatory Visit: Payer: Self-pay

## 2022-08-03 ENCOUNTER — Inpatient Hospital Stay: Payer: Medicare HMO | Attending: Oncology

## 2022-08-03 ENCOUNTER — Encounter: Payer: Self-pay | Admitting: Oncology

## 2022-08-03 ENCOUNTER — Inpatient Hospital Stay (HOSPITAL_BASED_OUTPATIENT_CLINIC_OR_DEPARTMENT_OTHER): Payer: Medicare HMO | Admitting: Oncology

## 2022-08-03 VITALS — BP 155/68 | HR 60 | Temp 97.7°F | Resp 17 | Wt 114.0 lb

## 2022-08-03 DIAGNOSIS — D509 Iron deficiency anemia, unspecified: Secondary | ICD-10-CM

## 2022-08-03 DIAGNOSIS — D649 Anemia, unspecified: Secondary | ICD-10-CM

## 2022-08-03 DIAGNOSIS — D472 Monoclonal gammopathy: Secondary | ICD-10-CM | POA: Insufficient documentation

## 2022-08-03 LAB — CBC WITH DIFFERENTIAL/PLATELET
Abs Immature Granulocytes: 0.01 10*3/uL (ref 0.00–0.07)
Basophils Absolute: 0 10*3/uL (ref 0.0–0.1)
Basophils Relative: 1 %
Eosinophils Absolute: 0.2 10*3/uL (ref 0.0–0.5)
Eosinophils Relative: 4 %
HCT: 31.9 % — ABNORMAL LOW (ref 36.0–46.0)
Hemoglobin: 10.5 g/dL — ABNORMAL LOW (ref 12.0–15.0)
Immature Granulocytes: 0 %
Lymphocytes Relative: 30 %
Lymphs Abs: 1.1 10*3/uL (ref 0.7–4.0)
MCH: 31.2 pg (ref 26.0–34.0)
MCHC: 32.9 g/dL (ref 30.0–36.0)
MCV: 94.7 fL (ref 80.0–100.0)
Monocytes Absolute: 0.4 10*3/uL (ref 0.1–1.0)
Monocytes Relative: 12 %
Neutro Abs: 2 10*3/uL (ref 1.7–7.7)
Neutrophils Relative %: 53 %
Platelets: 190 10*3/uL (ref 150–400)
RBC: 3.37 MIL/uL — ABNORMAL LOW (ref 3.87–5.11)
RDW: 13.9 % (ref 11.5–15.5)
WBC: 3.8 10*3/uL — ABNORMAL LOW (ref 4.0–10.5)
nRBC: 0 % (ref 0.0–0.2)

## 2022-08-03 LAB — COMPREHENSIVE METABOLIC PANEL
ALT: 10 U/L (ref 0–44)
AST: 15 U/L (ref 15–41)
Albumin: 3.1 g/dL — ABNORMAL LOW (ref 3.5–5.0)
Alkaline Phosphatase: 53 U/L (ref 38–126)
Anion gap: 9 (ref 5–15)
BUN: 12 mg/dL (ref 8–23)
CO2: 29 mmol/L (ref 22–32)
Calcium: 9 mg/dL (ref 8.9–10.3)
Chloride: 87 mmol/L — ABNORMAL LOW (ref 98–111)
Creatinine, Ser: 0.66 mg/dL (ref 0.44–1.00)
GFR, Estimated: >60 mL/min (ref >60)
Glucose, Bld: 112 mg/dL — ABNORMAL HIGH (ref 70–99)
Potassium: 3.3 mmol/L — ABNORMAL LOW (ref 3.5–5.1)
Sodium: 125 mmol/L — ABNORMAL LOW (ref 135–145)
Total Bilirubin: 0.5 mg/dL (ref 0.3–1.2)
Total Protein: 8.1 g/dL (ref 6.5–8.1)

## 2022-08-03 NOTE — Progress Notes (Signed)
Patient here for oncology follow-up appointment, concerns of rash 
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
## 2022-08-04 LAB — KAPPA/LAMBDA LIGHT CHAINS
Kappa free light chain: 21.3 mg/L — ABNORMAL HIGH (ref 3.3–19.4)
Kappa, lambda light chain ratio: 1.08 (ref 0.26–1.65)
Lambda free light chains: 19.7 mg/L (ref 5.7–26.3)

## 2022-08-08 LAB — MULTIPLE MYELOMA PANEL, SERUM
Albumin SerPl Elph-Mcnc: 3.2 g/dL (ref 2.9–4.4)
Albumin/Glob SerPl: 0.7 (ref 0.7–1.7)
Alpha 1: 0.2 g/dL (ref 0.0–0.4)
Alpha2 Glob SerPl Elph-Mcnc: 0.7 g/dL (ref 0.4–1.0)
B-Globulin SerPl Elph-Mcnc: 1.2 g/dL (ref 0.7–1.3)
Gamma Glob SerPl Elph-Mcnc: 2.5 g/dL — ABNORMAL HIGH (ref 0.4–1.8)
Globulin, Total: 4.6 g/dL — ABNORMAL HIGH (ref 2.2–3.9)
IgA: 2618 mg/dL — ABNORMAL HIGH (ref 64–422)
IgG (Immunoglobin G), Serum: 982 mg/dL (ref 586–1602)
IgM (Immunoglobulin M), Srm: 31 mg/dL (ref 26–217)
M Protein SerPl Elph-Mcnc: 1.2 g/dL — ABNORMAL HIGH
Total Protein ELP: 7.8 g/dL (ref 6.0–8.5)

## 2022-10-03 ENCOUNTER — Inpatient Hospital Stay: Payer: Medicare HMO | Attending: Oncology

## 2022-10-03 ENCOUNTER — Other Ambulatory Visit: Payer: Medicare HMO

## 2022-10-03 DIAGNOSIS — D472 Monoclonal gammopathy: Secondary | ICD-10-CM | POA: Diagnosis not present

## 2022-10-03 DIAGNOSIS — D509 Iron deficiency anemia, unspecified: Secondary | ICD-10-CM | POA: Insufficient documentation

## 2022-10-03 LAB — CBC
HCT: 33.6 % — ABNORMAL LOW (ref 36.0–46.0)
Hemoglobin: 11.3 g/dL — ABNORMAL LOW (ref 12.0–15.0)
MCH: 31.7 pg (ref 26.0–34.0)
MCHC: 33.6 g/dL (ref 30.0–36.0)
MCV: 94.1 fL (ref 80.0–100.0)
Platelets: 188 10*3/uL (ref 150–400)
RBC: 3.57 MIL/uL — ABNORMAL LOW (ref 3.87–5.11)
RDW: 13.2 % (ref 11.5–15.5)
WBC: 3.2 10*3/uL — ABNORMAL LOW (ref 4.0–10.5)
nRBC: 0 % (ref 0.0–0.2)

## 2022-10-03 LAB — VITAMIN B12: Vitamin B-12: 242 pg/mL (ref 180–914)

## 2022-10-03 LAB — IRON AND TIBC
Iron: 102 ug/dL (ref 28–170)
Saturation Ratios: 41 % — ABNORMAL HIGH (ref 10.4–31.8)
TIBC: 249 ug/dL — ABNORMAL LOW (ref 250–450)
UIBC: 147 ug/dL

## 2022-10-03 LAB — FOLATE: Folate: 18.1 ng/mL (ref >5.9)

## 2022-10-03 LAB — FERRITIN: Ferritin: 50 ng/mL (ref 11–307)

## 2022-10-04 ENCOUNTER — Encounter: Payer: Self-pay | Admitting: Oncology

## 2022-10-06 ENCOUNTER — Encounter: Payer: Self-pay | Admitting: *Deleted

## 2022-12-06 ENCOUNTER — Other Ambulatory Visit: Payer: Medicare HMO

## 2022-12-06 ENCOUNTER — Ambulatory Visit: Payer: Medicare HMO | Admitting: Oncology

## 2022-12-06 ENCOUNTER — Inpatient Hospital Stay: Payer: Medicare HMO | Attending: Oncology

## 2022-12-06 ENCOUNTER — Other Ambulatory Visit: Payer: Self-pay

## 2022-12-06 ENCOUNTER — Encounter: Payer: Self-pay | Admitting: *Deleted

## 2022-12-06 ENCOUNTER — Encounter: Payer: Self-pay | Admitting: Oncology

## 2022-12-06 ENCOUNTER — Inpatient Hospital Stay: Payer: Medicare HMO | Admitting: Oncology

## 2022-12-06 VITALS — BP 149/77 | HR 61 | Temp 96.8°F | Resp 18 | Ht 63.0 in | Wt 117.0 lb

## 2022-12-06 DIAGNOSIS — D472 Monoclonal gammopathy: Secondary | ICD-10-CM

## 2022-12-06 DIAGNOSIS — Z803 Family history of malignant neoplasm of breast: Secondary | ICD-10-CM | POA: Diagnosis not present

## 2022-12-06 DIAGNOSIS — D709 Neutropenia, unspecified: Secondary | ICD-10-CM | POA: Insufficient documentation

## 2022-12-06 DIAGNOSIS — Z8041 Family history of malignant neoplasm of ovary: Secondary | ICD-10-CM | POA: Diagnosis not present

## 2022-12-06 DIAGNOSIS — D649 Anemia, unspecified: Secondary | ICD-10-CM | POA: Insufficient documentation

## 2022-12-06 DIAGNOSIS — Z800 Family history of malignant neoplasm of digestive organs: Secondary | ICD-10-CM | POA: Insufficient documentation

## 2022-12-06 DIAGNOSIS — D509 Iron deficiency anemia, unspecified: Secondary | ICD-10-CM

## 2022-12-06 DIAGNOSIS — R300 Dysuria: Secondary | ICD-10-CM

## 2022-12-06 DIAGNOSIS — E610 Copper deficiency: Secondary | ICD-10-CM | POA: Diagnosis not present

## 2022-12-06 DIAGNOSIS — Z806 Family history of leukemia: Secondary | ICD-10-CM | POA: Insufficient documentation

## 2022-12-06 LAB — COMPREHENSIVE METABOLIC PANEL
ALT: 12 U/L (ref 0–44)
AST: 15 U/L (ref 15–41)
Albumin: 3 g/dL — ABNORMAL LOW (ref 3.5–5.0)
Alkaline Phosphatase: 49 U/L (ref 38–126)
Anion gap: 11 (ref 5–15)
BUN: 16 mg/dL (ref 8–23)
CO2: 29 mmol/L (ref 22–32)
Calcium: 9.3 mg/dL (ref 8.9–10.3)
Chloride: 89 mmol/L — ABNORMAL LOW (ref 98–111)
Creatinine, Ser: 0.75 mg/dL (ref 0.44–1.00)
GFR, Estimated: >60 mL/min (ref >60)
Glucose, Bld: 104 mg/dL — ABNORMAL HIGH (ref 70–99)
Potassium: 3.5 mmol/L (ref 3.5–5.1)
Sodium: 129 mmol/L — ABNORMAL LOW (ref 135–145)
Total Bilirubin: 0.6 mg/dL (ref 0.3–1.2)
Total Protein: 8.5 g/dL — ABNORMAL HIGH (ref 6.5–8.1)

## 2022-12-06 LAB — CBC
HCT: 34.7 % — ABNORMAL LOW (ref 36.0–46.0)
Hemoglobin: 11.5 g/dL — ABNORMAL LOW (ref 12.0–15.0)
MCH: 31.2 pg (ref 26.0–34.0)
MCHC: 33.1 g/dL (ref 30.0–36.0)
MCV: 94 fL (ref 80.0–100.0)
Platelets: 204 10*3/uL (ref 150–400)
RBC: 3.69 MIL/uL — ABNORMAL LOW (ref 3.87–5.11)
RDW: 13.4 % (ref 11.5–15.5)
WBC: 3.6 10*3/uL — ABNORMAL LOW (ref 4.0–10.5)
nRBC: 0 % (ref 0.0–0.2)

## 2022-12-06 LAB — VITAMIN B12: Vitamin B-12: 216 pg/mL (ref 180–914)

## 2022-12-06 LAB — URINALYSIS, COMPLETE (UACMP) WITH MICROSCOPIC
Bilirubin Urine: NEGATIVE
Glucose, UA: NEGATIVE mg/dL
Ketones, ur: NEGATIVE mg/dL
Nitrite: NEGATIVE
Protein, ur: NEGATIVE mg/dL
Specific Gravity, Urine: 1.015 (ref 1.005–1.030)
WBC, UA: >50 WBC/hpf (ref 0–5)
pH: 7 (ref 5.0–8.0)

## 2022-12-06 LAB — IRON AND TIBC
Iron: 90 ug/dL (ref 28–170)
Saturation Ratios: 35 % — ABNORMAL HIGH (ref 10.4–31.8)
TIBC: 256 ug/dL (ref 250–450)
UIBC: 166 ug/dL

## 2022-12-06 LAB — FERRITIN: Ferritin: 63 ng/mL (ref 11–307)

## 2022-12-06 LAB — FOLATE: Folate: 14.8 ng/mL (ref >5.9)

## 2022-12-06 MED ORDER — SULFAMETHOXAZOLE-TRIMETHOPRIM 800-160 MG PO TABS: 1.0000 | ORAL_TABLET | Freq: Two times a day (BID) | ORAL | 0 refills | Status: AC
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
## 2022-12-07 LAB — URINE CULTURE: Culture: <10000 — AB

## 2023-03-07 ENCOUNTER — Inpatient Hospital Stay: Payer: Medicare HMO | Attending: Oncology

## 2023-03-07 DIAGNOSIS — D472 Monoclonal gammopathy: Secondary | ICD-10-CM

## 2023-03-07 DIAGNOSIS — D649 Anemia, unspecified: Secondary | ICD-10-CM | POA: Diagnosis not present

## 2023-03-07 LAB — CBC WITH DIFFERENTIAL/PLATELET
Abs Immature Granulocytes: 0.01 10*3/uL (ref 0.00–0.07)
Basophils Absolute: 0 10*3/uL (ref 0.0–0.1)
Basophils Relative: 1 %
Eosinophils Absolute: 0.2 10*3/uL (ref 0.0–0.5)
Eosinophils Relative: 4 %
HCT: 32.1 % — ABNORMAL LOW (ref 36.0–46.0)
Hemoglobin: 10.8 g/dL — ABNORMAL LOW (ref 12.0–15.0)
Immature Granulocytes: 0 %
Lymphocytes Relative: 27 %
Lymphs Abs: 1.1 10*3/uL (ref 0.7–4.0)
MCH: 31.9 pg (ref 26.0–34.0)
MCHC: 33.6 g/dL (ref 30.0–36.0)
MCV: 94.7 fL (ref 80.0–100.0)
Monocytes Absolute: 0.5 10*3/uL (ref 0.1–1.0)
Monocytes Relative: 13 %
Neutro Abs: 2.2 10*3/uL (ref 1.7–7.7)
Neutrophils Relative %: 55 %
Platelets: 187 10*3/uL (ref 150–400)
RBC: 3.39 MIL/uL — ABNORMAL LOW (ref 3.87–5.11)
RDW: 13.9 % (ref 11.5–15.5)
WBC: 3.9 10*3/uL — ABNORMAL LOW (ref 4.0–10.5)
nRBC: 0 % (ref 0.0–0.2)

## 2023-03-07 LAB — VITAMIN B12: Vitamin B-12: 1006 pg/mL — ABNORMAL HIGH (ref 180–914)

## 2023-03-08 LAB — KAPPA/LAMBDA LIGHT CHAINS
Kappa free light chain: 20.5 mg/L — ABNORMAL HIGH (ref 3.3–19.4)
Kappa, lambda light chain ratio: 0.9 (ref 0.26–1.65)
Lambda free light chains: 22.7 mg/L (ref 5.7–26.3)

## 2023-03-10 ENCOUNTER — Encounter: Payer: Self-pay | Admitting: Oncology

## 2023-03-10 LAB — MULTIPLE MYELOMA PANEL, SERUM
Albumin SerPl Elph-Mcnc: 3.5 g/dL (ref 2.9–4.4)
Albumin/Glob SerPl: 0.8 (ref 0.7–1.7)
Alpha 1: 0.2 g/dL (ref 0.0–0.4)
Alpha2 Glob SerPl Elph-Mcnc: 0.7 g/dL (ref 0.4–1.0)
B-Globulin SerPl Elph-Mcnc: 3.7 g/dL — ABNORMAL HIGH (ref 0.7–1.3)
Gamma Glob SerPl Elph-Mcnc: 0.2 g/dL — ABNORMAL LOW (ref 0.4–1.8)
Globulin, Total: 4.8 g/dL — ABNORMAL HIGH (ref 2.2–3.9)
IgA: 2691 mg/dL — ABNORMAL HIGH (ref 64–422)
IgG (Immunoglobin G), Serum: 884 mg/dL (ref 586–1602)
IgM (Immunoglobulin M), Srm: 29 mg/dL (ref 26–217)
M Protein SerPl Elph-Mcnc: 2.1 g/dL — ABNORMAL HIGH
Total Protein ELP: 8.3 g/dL (ref 6.0–8.5)

## 2023-06-05 ENCOUNTER — Inpatient Hospital Stay: Payer: Medicare HMO | Admitting: Oncology

## 2023-06-05 ENCOUNTER — Encounter: Payer: Self-pay | Admitting: Oncology

## 2023-06-05 ENCOUNTER — Inpatient Hospital Stay: Payer: Medicare HMO | Attending: Oncology

## 2023-06-05 VITALS — BP 162/72 | HR 60 | Temp 98.8°F | Resp 18 | Wt 123.3 lb

## 2023-06-05 DIAGNOSIS — D472 Monoclonal gammopathy: Secondary | ICD-10-CM

## 2023-06-05 DIAGNOSIS — D649 Anemia, unspecified: Secondary | ICD-10-CM

## 2023-06-05 LAB — VITAMIN B12: Vitamin B-12: 339 pg/mL (ref 180–914)

## 2023-06-05 LAB — CBC WITH DIFFERENTIAL/PLATELET
Abs Immature Granulocytes: 0.01 10*3/uL (ref 0.00–0.07)
Basophils Absolute: 0 10*3/uL (ref 0.0–0.1)
Basophils Relative: 1 %
Eosinophils Absolute: 0.1 10*3/uL (ref 0.0–0.5)
Eosinophils Relative: 3 %
HCT: 35 % — ABNORMAL LOW (ref 36.0–46.0)
Hemoglobin: 11.9 g/dL — ABNORMAL LOW (ref 12.0–15.0)
Immature Granulocytes: 0 %
Lymphocytes Relative: 31 %
Lymphs Abs: 1.1 10*3/uL (ref 0.7–4.0)
MCH: 31.9 pg (ref 26.0–34.0)
MCHC: 34 g/dL (ref 30.0–36.0)
MCV: 93.8 fL (ref 80.0–100.0)
Monocytes Absolute: 0.4 10*3/uL (ref 0.1–1.0)
Monocytes Relative: 11 %
Neutro Abs: 1.9 10*3/uL (ref 1.7–7.7)
Neutrophils Relative %: 54 %
Platelets: 188 10*3/uL (ref 150–400)
RBC: 3.73 MIL/uL — ABNORMAL LOW (ref 3.87–5.11)
RDW: 13.9 % (ref 11.5–15.5)
WBC: 3.5 10*3/uL — ABNORMAL LOW (ref 4.0–10.5)
nRBC: 0 % (ref 0.0–0.2)

## 2023-06-05 NOTE — Progress Notes (Signed)
 Hematology/Oncology Consult note [facility]  [phone] [phone]  Patient Care Team: Orene Desanctis, MD as PCP - General (Pediatrics) Creig Hines, MD as Consulting Physician (Oncology)   Name of the patient: Janet Duffy  [id number]  [birth year]   Date of visit: 06/05/23  Diagnosis-IgA lambda MGUS  Chief complaint/ Reason for visit-Routine follow up of MGUS  Heme/Onc history: Patient is a 89 year old female and this is a transfer of care from Dr. Merlene Pulling.  Patient was diagnosed with IgA lambda MGUS in 2007.  No evidence of progression so far.  M spike on myeloma panel has been around 2.5.  No crab criteria.  PET scan in 11/22/2018 did not show any evidence of active myeloma.Bone marrow biopsy on 11/19/2018 revealed a hypercellular marrow for age (40%). Myeloid and erythroid elements were present in normal proportions. Megakaryocytes demonstrated a spectrum of maturation without clustering. There were no atypical lymphoid aggregates (CD20, CD3) or granulomas. CD138 highlighted an increased plasma cell population (~10%) which were lambda restricted.  A subset of the plasma cells also expressed CD56.   Differential included a non-IgM monoclonal gammopathy of undetermined significance (MGUS) and plasma cell myeloma.    Patient has mild neutropenia possibly due to copper deficiency for which patient is on copper supplements.  She also has a history of iron deficiency in the past.      Interval history-she is doing well for her age and remains independent of her IADLs and ADLs.  No recent hospitalizations or falls.  Denies any changes in appetite or weight.  Denies any new aches and pains anywhere.  ECOG PS- 1 Pain scale- 0   Review of systems- Review of Systems  Constitutional:  Negative for chills, fever, malaise/fatigue and weight loss.  HENT:  Negative for congestion, ear discharge and nosebleeds.   Eyes:  Negative for blurred  vision.  Respiratory:  Negative for cough, hemoptysis, sputum production, shortness of breath and wheezing.   Cardiovascular:  Negative for chest pain, palpitations, orthopnea and claudication.  Gastrointestinal:  Negative for abdominal pain, blood in stool, constipation, diarrhea, heartburn, melena, nausea and vomiting.  Genitourinary:  Negative for dysuria, flank pain, frequency, hematuria and urgency.  Musculoskeletal:  Negative for back pain, joint pain and myalgias.  Skin:  Negative for rash.  Neurological:  Negative for dizziness, tingling, focal weakness, seizures, weakness and headaches.  Endo/Heme/Allergies:  Does not bruise/bleed easily.  Psychiatric/Behavioral:  Negative for depression and suicidal ideas. The patient does not have insomnia.       Allergies  Allergen Reactions   Clindamycin/Lincomycin Anaphylaxis   Codeine Nausea Only   Erythromycin Anaphylaxis   Ivp Dye [Iodinated Contrast Media] Anaphylaxis   Metrizamide Anaphylaxis   Monosodium Glutamate Shortness Of Breath and Swelling    Tongue swelling   Moxifloxacin Anaphylaxis   Penicillins Anaphylaxis and Rash    Tolerated Cephalosporin Date: 02/14/22.      Tramadol Other (See Comments)    Patient discloses she "went crazy"   Amlodipine Swelling   Avelox [Moxifloxacin Hcl In Nacl] Other (See Comments)    unknown   Shellfish Allergy Hives    seafood   Zoster Vaccine Live Rash     Past Medical History:  Diagnosis Date   Connective tissue disease (HCC)    DDD (degenerative disc disease), lumbar    Diverticulitis    Granuloma annulare    HTN (hypertension)    Osteoporosis    Pre-diabetes      Past Surgical  History:  Procedure Laterality Date   ABDOMINAL HYSTERECTOMY     BACK SURGERY     EXTERNAL FIXATION REMOVAL Right 02/22/2022   Procedure: REMOVAL EXTERNAL FIXATION LEG;  Surgeon: Myrene Galas, MD;  Location: [facility] OR;  Service: Orthopedics;  Laterality: Right;   ORIF TIBIA FRACTURE Right  02/14/2022   Procedure: . EXTERNAL FIXATION RIGHT TIBIA;  Surgeon: Myrene Galas, MD;  Location: [facility] OR;  Service: Orthopedics;  Laterality: Right;   ORIF TIBIA PLATEAU Right 02/22/2022   Procedure: OPEN REDUCTION INTERNAL FIXATION (ORIF) TIBIAL PLATEAU;  Surgeon: Myrene Galas, MD;  Location: [facility] OR;  Service: Orthopedics;  Laterality: Right;    Social History   Socioeconomic History   Marital status: Married    Spouse name: Not on file   Number of children: Not on file   Years of education: Not on file   Highest education level: Not on file  Occupational History   Not on file  Tobacco Use   Smoking status: Never   Smokeless tobacco: Never  Vaping Use   Vaping status: Never Used  Substance and Sexual Activity   Alcohol use: No   Drug use: No   Sexual activity: Not on file  Other Topics Concern   Not on file  Social History Narrative   Not on file   Social Drivers of Health   Financial Resource Strain: Medium Risk (02/28/2023)   Received from [facility]   Overall Financial Resource Strain (CARDIA)    Difficulty of Paying Living Expenses: Somewhat hard  Food Insecurity: No Food Insecurity (02/28/2023)   Received from [facility]   Hunger Vital Sign    Worried About Running Out of Food in the Last Year: Never true    Ran Out of Food in the Last Year: Never true  Transportation Needs: No Transportation Needs (02/28/2023)   Received from [facility] - Transportation    In the past 12 months, has lack of transportation kept you from medical appointments or from getting medications?: No    Lack of Transportation (Non-Medical): No  Physical Activity: Not on file  Stress: Not on file  Social Connections: Not on file  Intimate Partner Violence: Not At Risk (02/13/2022)   Humiliation, Afraid, Rape, and Kick questionnaire    Fear of Current or Ex-Partner: No    Emotionally Abused: No    Physically Abused: No     Sexually Abused: No    Family History  Problem Relation Age of Onset   Ovarian cancer Mother    Breast cancer Sister    Leukemia Brother    Colon cancer Brother      Current Outpatient Medications:    acetaminophen (TYLENOL) 325 MG tablet, Take 2 tablets (650 mg total) by mouth every 6 (six) hours as needed for mild pain (or Fever >/= 101)., Disp: , Rfl:    apixaban (ELIQUIS) 2.5 MG TABS tablet, Take 1 tablet (2.5 mg total) by mouth 2 (two) times daily for 20 days. (Patient not taking: Reported on 12/06/2022), Disp: 60 tablet, Rfl:    carvedilol (COREG) 25 MG tablet, Take 25 mg by mouth 2 (two) times daily., Disp: , Rfl:    cetirizine (ZYRTEC) 10 MG tablet, Take 10 mg by mouth daily., Disp: , Rfl:    chlorthalidone (HYGROTON) 25 MG tablet, Take 0.5 tablets (12.5 mg total) by mouth daily., Disp: , Rfl:    Cholecalciferol (VITAMIN D3) 1000 units CAPS, Take 1,000 Units by  mouth daily., Disp: , Rfl:    gentamicin (GARAMYCIN) 0.3 % ophthalmic solution, SMARTSIG:In Eye(s) (Patient not taking: Reported on 12/06/2022), Disp: , Rfl:    lisinopril (PRINIVIL,ZESTRIL) 40 MG tablet, Take 1 tablet (40 mg total) by mouth daily., Disp: 30 tablet, Rfl: 0   simvastatin (ZOCOR) 5 MG tablet, Take 5 mg by mouth at bedtime., Disp: , Rfl:    sulfamethoxazole-trimethoprim (BACTRIM DS) 800-160 MG tablet, Take 1 tablet by mouth 2 (two) times daily., Disp: 10 tablet, Rfl: 0   triamcinolone cream (KENALOG) 0.1 %, Apply topically., Disp: , Rfl:    vitamin B-12 (CYANOCOBALAMIN) 1000 MCG tablet, Take 1,000 mcg by mouth daily. (Patient not taking: Reported on 08/03/2022), Disp: , Rfl:    Wheat Dextrin (BENEFIBER) POWD, Take 1 Dose by mouth daily., Disp: , Rfl:   Physical exam:  Vitals:   06/05/23 1116  BP: (!) 162/72  Pulse: 60  Resp: 18  Temp: 98.8 F (37.1 C)  TempSrc: Tympanic  SpO2: 100%  Weight: 123 lb 4.8 oz (55.9 kg)   Physical Exam Cardiovascular:     Rate and Rhythm: Normal rate and regular rhythm.      Heart sounds: Normal heart sounds.  Pulmonary:     Effort: Pulmonary effort is normal.     Breath sounds: Normal breath sounds.  Skin:    General: Skin is warm and dry.  Neurological:     Mental Status: She is alert and oriented to person, place, and time.         Latest Ref Rng & Units 12/06/2022    9:42 AM  CMP  Glucose 70 - 99 mg/dL 865   BUN 8 - 23 mg/dL 16   Creatinine 7.84 - 1.00 mg/dL 6.96   Sodium 295 - 284 mmol/L 129   Potassium 3.5 - 5.1 mmol/L 3.5   Chloride 98 - 111 mmol/L 89   CO2 22 - 32 mmol/L 29   Calcium 8.9 - 10.3 mg/dL 9.3   Total Protein 6.5 - 8.1 g/dL 8.5   Total Bilirubin 0.3 - 1.2 mg/dL 0.6   Alkaline Phos 38 - 126 U/L 49   AST 15 - 41 U/L 15   ALT 0 - 44 U/L 12       Latest Ref Rng & Units 06/05/2023   10:59 AM  CBC  WBC 4.0 - 10.5 K/uL 3.5   Hemoglobin 12.0 - 15.0 g/dL 13.2   Hematocrit 44.0 - 46.0 % 35.0   Platelets 150 - 400 K/uL 188     No images are attached to the encounter.  No results found.   Assessment and plan- Patient is a 89 y.o. female here for a routine follow-up of IgA lambda MGUS  Patient's hemoglobin has overall improved from 10.8-11.9 presently.  Creatinine and calcium levels are normal.  Myeloma panel and serum free light chains are currently pending.  Clinically no signs and symptoms concerning for overt myeloma progression.  I will see her back in 6 months with CBC with differential CMP myeloma panel and serum free light chains   Visit Diagnosis 1. MGUS (monoclonal gammopathy of unknown significance)      Dr. Owens Shark, MD, MPH [facility] at [facility] [phone] 06/05/2023 4:19 PM

## 2023-06-06 LAB — KAPPA/LAMBDA LIGHT CHAINS
Kappa free light chain: 20.2 mg/L — ABNORMAL HIGH (ref 3.3–19.4)
Kappa, lambda light chain ratio: 1 (ref 0.26–1.65)
Lambda free light chains: 20.1 mg/L (ref 5.7–26.3)

## 2023-06-08 ENCOUNTER — Encounter: Payer: Self-pay | Admitting: Oncology

## 2023-06-08 LAB — MULTIPLE MYELOMA PANEL, SERUM
Albumin SerPl Elph-Mcnc: 3.7 g/dL (ref 2.9–4.4)
Albumin/Glob SerPl: 0.8 (ref 0.7–1.7)
Alpha 1: 0.3 g/dL (ref 0.0–0.4)
Alpha2 Glob SerPl Elph-Mcnc: 0.7 g/dL (ref 0.4–1.0)
B-Globulin SerPl Elph-Mcnc: 1.6 g/dL — ABNORMAL HIGH (ref 0.7–1.3)
Gamma Glob SerPl Elph-Mcnc: 2.6 g/dL — ABNORMAL HIGH (ref 0.4–1.8)
Globulin, Total: 5.2 g/dL — ABNORMAL HIGH (ref 2.2–3.9)
IgA: 2985 mg/dL — ABNORMAL HIGH (ref 64–422)
IgG (Immunoglobin G), Serum: 982 mg/dL (ref 586–1602)
IgM (Immunoglobulin M), Srm: 30 mg/dL (ref 26–217)
M Protein SerPl Elph-Mcnc: 2 g/dL — ABNORMAL HIGH
Total Protein ELP: 8.9 g/dL — ABNORMAL HIGH (ref 6.0–8.5)

## 2023-08-22 ENCOUNTER — Encounter (INDEPENDENT_AMBULATORY_CARE_PROVIDER_SITE_OTHER): Payer: Self-pay

## 2023-09-25 ENCOUNTER — Other Ambulatory Visit (INDEPENDENT_AMBULATORY_CARE_PROVIDER_SITE_OTHER): Payer: Self-pay | Admitting: Nurse Practitioner

## 2023-09-25 DIAGNOSIS — I739 Peripheral vascular disease, unspecified: Secondary | ICD-10-CM

## 2023-09-25 DIAGNOSIS — I7300 Raynaud's syndrome without gangrene: Secondary | ICD-10-CM

## 2023-09-28 ENCOUNTER — Encounter (INDEPENDENT_AMBULATORY_CARE_PROVIDER_SITE_OTHER): Payer: Self-pay

## 2023-09-28 ENCOUNTER — Ambulatory Visit (INDEPENDENT_AMBULATORY_CARE_PROVIDER_SITE_OTHER): Payer: Self-pay | Admitting: Nurse Practitioner

## 2023-09-28 ENCOUNTER — Encounter (INDEPENDENT_AMBULATORY_CARE_PROVIDER_SITE_OTHER): Payer: Self-pay | Admitting: Nurse Practitioner

## 2023-09-28 ENCOUNTER — Other Ambulatory Visit (INDEPENDENT_AMBULATORY_CARE_PROVIDER_SITE_OTHER): Payer: Self-pay

## 2023-09-28 ENCOUNTER — Ambulatory Visit (INDEPENDENT_AMBULATORY_CARE_PROVIDER_SITE_OTHER): Payer: Self-pay

## 2023-09-28 VITALS — BP 175/79 | HR 58 | Ht 63.0 in | Wt 123.6 lb

## 2023-09-28 DIAGNOSIS — R6889 Other general symptoms and signs: Secondary | ICD-10-CM

## 2023-09-28 DIAGNOSIS — I7300 Raynaud's syndrome without gangrene: Secondary | ICD-10-CM

## 2023-09-28 DIAGNOSIS — I739 Peripheral vascular disease, unspecified: Secondary | ICD-10-CM | POA: Diagnosis not present

## 2023-09-28 NOTE — Progress Notes (Signed)
 Subjective:    Patient ID: Janet Duffy, female    DOB: 1931-04-06, 89 y.o.   MRN: [id number] Chief Complaint  Patient presents with   New Patient (Initial Visit)    np. Art Dop + UE digits + consult. PVD + Raynaud's syndrome without gangrene (hands). behling, karen.     Janet Duffy is a 89 year old female who presents today from her PCP Dr. Delfina in regards to history of Raynaud's disease and recent abnormal ABIs done by her home health agency.  The patient endorses having history of restless leg syndrome but otherwise denies any significant claudication rest pain or ulceration.  She notes that she has had restless leg syndrome since she was a young woman.  She recently had a home health visit and was told she had abnormal perfusion based on a blood flow study done.  In addition she has a history of Raynaud's disease and notes that has been fairly well-controlled for years.  She initially tried amlodipine but was unable to tolerate this.  Currently she uses nitroglycerin  for the moments where she is discomfort in her hands and fingers.  She notes that it typically tends to be her right hand.  She currently has no open wounds or ulcerations in her upper extremities.  Today she underwent noninvasive studies which shows a right ABI of 1.12 and a left of 1.13.  She has normal TBI's bilaterally.  She has strong triphasic waveforms bilaterally with normal toe waveforms bilaterally.    Review of Systems  All other systems reviewed and are negative.      Objective:   Physical Exam Vitals reviewed.  HENT:     Head: Normocephalic.   Cardiovascular:     Rate and Rhythm: Normal rate.     Pulses:          Dorsalis pedis pulses are 2+ on the right side and 2+ on the left side.       Posterior tibial pulses are 2+ on the right side and 2+ on the left side.  Pulmonary:     Effort: Pulmonary effort is normal.   Musculoskeletal:     Right lower leg: No edema.     Left lower leg: No edema.    Skin:    General: Skin is warm and dry.     Capillary Refill: Capillary refill takes less than 2 seconds.   Neurological:     Mental Status: She is alert and oriented to person, place, and time.   Psychiatric:        Mood and Affect: Mood normal.        Behavior: Behavior normal.        Thought Content: Thought content normal.        Judgment: Judgment normal.     BP (!) 175/79   Pulse (!) 58   Ht 5' 3 (1.6 m)   Wt 123 lb 9.6 oz (56.1 kg)   BMI 21.89 kg/m   Past Medical History:  Diagnosis Date   Connective tissue disease (HCC)    DDD (degenerative disc disease), lumbar    Diverticulitis    Granuloma annulare    HTN (hypertension)    Osteoporosis    Pre-diabetes     Social History   Socioeconomic History   Marital status: Married    Spouse name: Not on file   Number of children: Not on file   Years of education: Not on file   Highest education level: Not on file  Occupational  History   Not on file  Tobacco Use   Smoking status: Never   Smokeless tobacco: Never  Vaping Use   Vaping status: Never Used  Substance and Sexual Activity   Alcohol use: No   Drug use: No   Sexual activity: Not on file  Other Topics Concern   Not on file  Social History Narrative   Not on file   Social Drivers of Health   Financial Resource Strain: Medium Risk (02/28/2023)   Received from [facility]   Overall Financial Resource Strain (CARDIA)    Difficulty of Paying Living Expenses: Somewhat hard  Food Insecurity: No Food Insecurity (02/28/2023)   Received from [facility]   Hunger Vital Sign    Within the past 12 months, you worried that your food would run out before you got the money to buy more.: Never true    Within the past 12 months, the food you bought just didn't last and you didn't have money to get more.: Never true  Transportation Needs: No Transportation Needs (02/28/2023)   Received from [facility] - Transportation    In the past 12 months, has lack of transportation kept you from medical appointments or from getting medications?: No    Lack of Transportation (Non-Medical): No  Physical Activity: Not on file  Stress: Not on file  Social Connections: Not on file  Intimate Partner Violence: Not At Risk (02/13/2022)   Humiliation, Afraid, Rape, and Kick questionnaire    Fear of Current or Ex-Partner: No    Emotionally Abused: No    Physically Abused: No    Sexually Abused: No    Past Surgical History:  Procedure Laterality Date   ABDOMINAL HYSTERECTOMY     BACK SURGERY     EXTERNAL FIXATION REMOVAL Right 02/22/2022   Procedure: REMOVAL EXTERNAL FIXATION LEG;  Surgeon: Celena Sharper, MD;  Location: [facility] OR;  Service: Orthopedics;  Laterality: Right;   ORIF TIBIA FRACTURE Right 02/14/2022   Procedure: . EXTERNAL FIXATION RIGHT TIBIA;  Surgeon: Celena Sharper, MD;  Location: [facility] OR;  Service: Orthopedics;  Laterality: Right;   ORIF TIBIA PLATEAU Right 02/22/2022   Procedure: OPEN REDUCTION INTERNAL FIXATION (ORIF) TIBIAL PLATEAU;  Surgeon: Celena Sharper, MD;  Location: [facility] OR;  Service: Orthopedics;  Laterality: Right;    Family History  Problem Relation Age of Onset   Ovarian cancer Mother    Breast cancer Sister    Leukemia Brother    Colon cancer Brother     Allergies  Allergen Reactions   Clindamycin/Lincomycin Anaphylaxis   Codeine Nausea Only   Erythromycin Anaphylaxis   Ivp Dye [Iodinated Contrast Media] Anaphylaxis   Metrizamide Anaphylaxis   Monosodium Glutamate Shortness Of Breath and Swelling    Tongue swelling   Moxifloxacin Anaphylaxis   Penicillins Anaphylaxis and Rash    Tolerated Cephalosporin Date: 02/14/22.      Tramadol  Other (See Comments)    Patient discloses she went crazy   Amlodipine Swelling   Avelox [Moxifloxacin Hcl In Nacl] Other (See Comments)    unknown   Shellfish Allergy Hives    seafood   Zoster Vaccine Live Rash        Latest Ref Rng & Units 06/05/2023   10:59 AM 03/07/2023   10:38 AM 12/06/2022    9:42 AM  CBC  WBC 4.0 - 10.5 K/uL 3.5  3.9  3.6   Hemoglobin 12.0 - 15.0 g/dL 88.0  89.1  11.5   Hematocrit 36.0 - 46.0 % 35.0  32.1  34.7   Platelets 150 - 400 K/uL 188  187  204       CMP     Component Value Date/Time   NA 129 (L) 12/06/2022 0942   K 3.5 12/06/2022 0942   CL 89 (L) 12/06/2022 0942   CO2 29 12/06/2022 0942   GLUCOSE 104 (H) 12/06/2022 0942   BUN 16 12/06/2022 0942   CREATININE 0.75 12/06/2022 0942   CALCIUM  9.3 12/06/2022 0942   PROT 8.5 (H) 12/06/2022 0942   ALBUMIN  3.0 (L) 12/06/2022 0942   AST 15 12/06/2022 0942   ALT 12 12/06/2022 0942   ALKPHOS 49 12/06/2022 0942   BILITOT 0.6 12/06/2022 0942   GFRNONAA >60 12/06/2022 0942     No results found.     Assessment & Plan:   1. Abnormal ankle brachial index (ABI) (Primary) Today the patient's noninvasive studies indicate that there is no evidence of any significant arterial disease.  Her ABIs were normal compared to the abnormal ABIs that she had done by her home health agency.  She also does not have any significant symptoms of peripheral arterial disease.  At this time no further intervention is recommended.  2. Raynaud's disease without gangrene The patient does have evidence of Raynaud's disease and currently it is not giving her significant issue due to it being summer months.  She has had this for years and utilizes nitroglycerin  when she is having pain and discomfort.  This seems to work well for her and she has not developed any ulcerations.  She has previously tried first-line therapy such as amlodipine and she had adverse effects.  It is recommended that she continue with use of nitroglycerin  as needed for discomfort when she has issues with Raynaud's disease.  She is also recommended to keep her hands and feet warm during the wintertime as this will also help to mitigate ongoing issues.  Because this is  well-controlled for the patient she will follow-up with us  on an as-needed basis if there are issues that arise.   Current Outpatient Medications on File Prior to Visit  Medication Sig Dispense Refill   acetaminophen  (TYLENOL ) 325 MG tablet Take 2 tablets (650 mg total) by mouth every 6 (six) hours as needed for mild pain (or Fever >/= 101).     carvedilol  (COREG ) 25 MG tablet Take 25 mg by mouth 2 (two) times daily.     cetirizine (ZYRTEC) 10 MG tablet Take 10 mg by mouth daily.     chlorthalidone  (HYGROTON ) 25 MG tablet Take 0.5 tablets (12.5 mg total) by mouth daily.     Cholecalciferol  (VITAMIN D3) 1000 units CAPS Take 1,000 Units by mouth daily.     lisinopril  (PRINIVIL ,ZESTRIL ) 40 MG tablet Take 1 tablet (40 mg total) by mouth daily. 30 tablet 0   simvastatin  (ZOCOR ) 5 MG tablet Take 5 mg by mouth at bedtime.     vitamin B-12 (CYANOCOBALAMIN ) 1000 MCG tablet Take 1,000 mcg by mouth daily.     Wheat Dextrin (BENEFIBER) POWD Take 1 Dose by mouth daily.     apixaban  (ELIQUIS ) 2.5 MG TABS tablet Take 1 tablet (2.5 mg total) by mouth 2 (two) times daily for 20 days. (Patient not taking: Reported on 12/06/2022) 60 tablet    gentamicin (GARAMYCIN) 0.3 % ophthalmic solution SMARTSIG:In Eye(s) (Patient not taking: Reported on 12/06/2022)     sulfamethoxazole -trimethoprim  (BACTRIM  DS) 800-160 MG tablet Take 1 tablet by mouth  2 (two) times daily. 10 tablet 0   triamcinolone cream (KENALOG) 0.1 % Apply topically.     No current facility-administered medications on file prior to visit.    There are no Patient Instructions on file for this visit. No follow-ups on file.   Jermany Rimel E Mitchelle Goerner, NP

## 2023-09-29 LAB — VAS US ABI WITH/WO TBI
Left ABI: 1.13
Right ABI: 1.12

## 2023-12-05 ENCOUNTER — Other Ambulatory Visit: Payer: Self-pay

## 2023-12-05 ENCOUNTER — Encounter: Payer: Self-pay | Admitting: Oncology

## 2023-12-05 ENCOUNTER — Inpatient Hospital Stay: Attending: Oncology

## 2023-12-05 ENCOUNTER — Inpatient Hospital Stay: Admitting: Oncology

## 2023-12-05 VITALS — BP 177/76 | HR 62 | Temp 96.8°F | Resp 17 | Ht 63.0 in | Wt 124.7 lb

## 2023-12-05 DIAGNOSIS — D472 Monoclonal gammopathy: Secondary | ICD-10-CM | POA: Diagnosis present

## 2023-12-05 DIAGNOSIS — M810 Age-related osteoporosis without current pathological fracture: Secondary | ICD-10-CM | POA: Diagnosis not present

## 2023-12-05 DIAGNOSIS — E871 Hypo-osmolality and hyponatremia: Secondary | ICD-10-CM | POA: Diagnosis not present

## 2023-12-05 LAB — CBC WITH DIFFERENTIAL (CANCER CENTER ONLY)
Abs Immature Granulocytes: 0.01 K/uL (ref 0.00–0.07)
Basophils Absolute: 0 K/uL (ref 0.0–0.1)
Basophils Relative: 1 %
Eosinophils Absolute: 0.3 K/uL (ref 0.0–0.5)
Eosinophils Relative: 6 %
HCT: 32.5 % — ABNORMAL LOW (ref 36.0–46.0)
Hemoglobin: 10.7 g/dL — ABNORMAL LOW (ref 12.0–15.0)
Immature Granulocytes: 0 %
Lymphocytes Relative: 24 %
Lymphs Abs: 1 K/uL (ref 0.7–4.0)
MCH: 30.8 pg (ref 26.0–34.0)
MCHC: 32.9 g/dL (ref 30.0–36.0)
MCV: 93.7 fL (ref 80.0–100.0)
Monocytes Absolute: 0.6 K/uL (ref 0.1–1.0)
Monocytes Relative: 14 %
Neutro Abs: 2.2 K/uL (ref 1.7–7.7)
Neutrophils Relative %: 55 %
Platelet Count: 181 K/uL (ref 150–400)
RBC: 3.47 MIL/uL — ABNORMAL LOW (ref 3.87–5.11)
RDW: 14.3 % (ref 11.5–15.5)
WBC Count: 4.1 K/uL (ref 4.0–10.5)
nRBC: 0 % (ref 0.0–0.2)

## 2023-12-05 LAB — CMP (CANCER CENTER ONLY)
ALT: 9 U/L (ref 0–44)
AST: 14 U/L — ABNORMAL LOW (ref 15–41)
Albumin: 3 g/dL — ABNORMAL LOW (ref 3.5–5.0)
Alkaline Phosphatase: 55 U/L (ref 38–126)
Anion gap: 9 (ref 5–15)
BUN: 14 mg/dL (ref 8–23)
CO2: 27 mmol/L (ref 22–32)
Calcium: 8.9 mg/dL (ref 8.9–10.3)
Chloride: 90 mmol/L — ABNORMAL LOW (ref 98–111)
Creatinine: 0.76 mg/dL (ref 0.44–1.00)
GFR, Estimated: >60 mL/min (ref >60)
Glucose, Bld: 99 mg/dL (ref 70–99)
Potassium: 3.5 mmol/L (ref 3.5–5.1)
Sodium: 126 mmol/L — ABNORMAL LOW (ref 135–145)
Total Bilirubin: 0.6 mg/dL (ref 0.0–1.2)
Total Protein: 8.6 g/dL — ABNORMAL HIGH (ref 6.5–8.1)

## 2023-12-05 NOTE — Progress Notes (Addendum)
 Hematology/Oncology Consult note [facility]  Telephone:([phone] Fax:([phone]  Patient Care Team: Delfina Pao, MD as PCP - General (Pediatrics) Melanee Annah BROCKS, MD as Consulting Physician (Oncology)   Name of the patient: Janet Duffy  [id number]  05/29/1931   Date of visit: 12/05/23  Diagnosis-IgA lambda MGUS  Chief complaint/ Reason for visit-routine follow-up of MGUS  Heme/Onc history:  Patient is a 89 year old female and this is a transfer of care from Dr. Rudell.  Patient was diagnosed with IgA lambda MGUS in 2007.  No evidence of progression so far.  M spike on myeloma panel has been around 2.5.  No crab criteria.  PET scan in 11/22/2018 did not show any evidence of active myeloma.Bone marrow biopsy on 11/19/2018 revealed a hypercellular marrow for age (40%). Myeloid and erythroid elements were present in normal proportions. Megakaryocytes demonstrated a spectrum of maturation without clustering. There were no atypical lymphoid aggregates (CD20, CD3) or granulomas. CD138 highlighted an increased plasma cell population (~10%) which were lambda restricted.  A subset of the plasma cells also expressed CD56.   Differential included a non-IgM monoclonal gammopathy of undetermined significance (MGUS) and plasma cell myeloma.     She also has a history of iron deficiency in the past.    Interval history-patient is doing well for her age.  She is independent of her ADLs and IADLs.  She has some baseline fatigue and neck pain which is chronic.  ECOG PS- 1 Pain scale- 0   Review of systems- Review of Systems  Constitutional:  Negative for chills, fever, malaise/fatigue and weight loss.  HENT:  Negative for congestion, ear discharge and nosebleeds.   Eyes:  Negative for blurred vision.  Respiratory:  Negative for cough, hemoptysis, sputum production, shortness of breath and wheezing.   Cardiovascular:  Negative for chest pain, palpitations,  orthopnea and claudication.  Gastrointestinal:  Negative for abdominal pain, blood in stool, constipation, diarrhea, heartburn, melena, nausea and vomiting.  Genitourinary:  Negative for dysuria, flank pain, frequency, hematuria and urgency.  Musculoskeletal:  Negative for back pain, joint pain and myalgias.  Skin:  Negative for rash.  Neurological:  Negative for dizziness, tingling, focal weakness, seizures, weakness and headaches.  Endo/Heme/Allergies:  Does not bruise/bleed easily.  Psychiatric/Behavioral:  Negative for depression and suicidal ideas. The patient does not have insomnia.       Allergies  Allergen Reactions   Clindamycin/Lincomycin Anaphylaxis   Codeine Nausea Only   Erythromycin Anaphylaxis   Ivp Dye [Iodinated Contrast Media] Anaphylaxis   Metrizamide Anaphylaxis   Monosodium Glutamate Shortness Of Breath and Swelling    Tongue swelling   Moxifloxacin Anaphylaxis   Penicillins Anaphylaxis and Rash    Tolerated Cephalosporin Date: 02/14/22.      Tramadol  Other (See Comments)    Patient discloses she went crazy   Amlodipine Swelling   Avelox [Moxifloxacin Hcl In Nacl] Other (See Comments)    unknown   Shellfish Allergy Hives    seafood   Zoster Vaccine Live Rash     Past Medical History:  Diagnosis Date   Connective tissue disease (HCC)    DDD (degenerative disc disease), lumbar    Diverticulitis    Granuloma annulare    HTN (hypertension)    Osteoporosis    Pre-diabetes      Past Surgical History:  Procedure Laterality Date   ABDOMINAL HYSTERECTOMY     BACK SURGERY     EXTERNAL FIXATION REMOVAL Right 02/22/2022   Procedure:  REMOVAL EXTERNAL FIXATION LEG;  Surgeon: Celena Sharper, MD;  Location: [facility] OR;  Service: Orthopedics;  Laterality: Right;   ORIF TIBIA FRACTURE Right 02/14/2022   Procedure: . EXTERNAL FIXATION RIGHT TIBIA;  Surgeon: Celena Sharper, MD;  Location: [facility] OR;  Service: Orthopedics;  Laterality: Right;   ORIF TIBIA PLATEAU  Right 02/22/2022   Procedure: OPEN REDUCTION INTERNAL FIXATION (ORIF) TIBIAL PLATEAU;  Surgeon: Celena Sharper, MD;  Location: [facility] OR;  Service: Orthopedics;  Laterality: Right;    Social History   Socioeconomic History   Marital status: Married    Spouse name: Not on file   Number of children: Not on file   Years of education: Not on file   Highest education level: Not on file  Occupational History   Not on file  Tobacco Use   Smoking status: Never   Smokeless tobacco: Never  Vaping Use   Vaping status: Never Used  Substance and Sexual Activity   Alcohol use: No   Drug use: No   Sexual activity: Not on file  Other Topics Concern   Not on file  Social History Narrative   Not on file   Social Drivers of Health   Financial Resource Strain: Medium Risk (02/28/2023)   [facility]   Overall Financial Resource Strain (CARDIA)    Difficulty of Paying Living Expenses: Somewhat hard  Food Insecurity: No Food Insecurity (02/28/2023)   Received from [facility]   Hunger Vital Sign    Within the past 12 months, you worried that your food would run out before you got the money to buy more.: Never true    Within the past 12 months, the food you bought just didn't last and you didn't have money to get more.: Never true  Transportation Needs: No Transportation Needs (02/28/2023)   Received from [facility]   PRAPARE - Transportation    Lack of Transportation (Non-Medical): No    In the past 12 months, has lack of transportation kept you from medical appointments or from getting medications?: No  Physical Activity: Not on file  Stress: Not on file  Social Connections: Not on file  Intimate Partner Violence: Not At Risk (02/13/2022)   Humiliation, Afraid, Rape, and Kick questionnaire    Fear of Current or Ex-Partner: No    Emotionally Abused: No    Physically Abused: No    Sexually Abused: No    Family History   Problem Relation Age of Onset   Ovarian cancer Mother    Breast cancer Sister    Leukemia Brother    Colon cancer Brother      Current Outpatient Medications:    acetaminophen  (TYLENOL ) 325 MG tablet, Take 2 tablets (650 mg total) by mouth every 6 (six) hours as needed for mild pain (or Fever >/= 101)., Disp: , Rfl:    carvedilol  (COREG ) 25 MG tablet, Take 25 mg by mouth 2 (two) times daily., Disp: , Rfl:    cetirizine (ZYRTEC) 10 MG tablet, Take 10 mg by mouth daily., Disp: , Rfl:    chlorthalidone  (HYGROTON ) 25 MG tablet, Take 0.5 tablets (12.5 mg total) by mouth daily., Disp: , Rfl:    Cholecalciferol  (VITAMIN D3) 1000 units CAPS, Take 1,000 Units by mouth daily., Disp: , Rfl:    lisinopril  (PRINIVIL ,ZESTRIL ) 40 MG tablet, Take 1 tablet (40 mg total) by mouth daily., Disp: 30 tablet, Rfl: 0   Magnesium  100 MG CAPS, Take by mouth., Disp: ,  Rfl:    simvastatin  (ZOCOR ) 5 MG tablet, Take 5 mg by mouth at bedtime., Disp: , Rfl:    Wheat Dextrin (BENEFIBER) POWD, Take 1 Dose by mouth daily., Disp: , Rfl:    apixaban  (ELIQUIS ) 2.5 MG TABS tablet, Take 1 tablet (2.5 mg total) by mouth 2 (two) times daily for 20 days. (Patient not taking: Reported on 12/06/2022), Disp: 60 tablet, Rfl:    gentamicin (GARAMYCIN) 0.3 % ophthalmic solution, SMARTSIG:In Eye(s) (Patient not taking: Reported on 12/06/2022), Disp: , Rfl:    sulfamethoxazole -trimethoprim  (BACTRIM  DS) 800-160 MG tablet, Take 1 tablet by mouth 2 (two) times daily., Disp: 10 tablet, Rfl: 0   triamcinolone cream (KENALOG) 0.1 %, Apply topically., Disp: , Rfl:    vitamin B-12 (CYANOCOBALAMIN ) 1000 MCG tablet, Take 1,000 mcg by mouth daily. (Patient not taking: Reported on 12/05/2023), Disp: , Rfl:   Physical exam:  Vitals:   12/05/23 1143 12/05/23 1145  BP: (!) 171/84 (!) 177/76  Pulse: (!) 58 62  Resp: 17   Temp: (!) 96.8 F (36 C)   TempSrc: Tympanic   SpO2: 97%   Weight: 124 lb 11.2 oz (56.6 kg)   Height: 5' 3 (1.6 m)    Physical  Exam Cardiovascular:     Rate and Rhythm: Normal rate and regular rhythm.     Heart sounds: Normal heart sounds.  Pulmonary:     Effort: Pulmonary effort is normal.     Breath sounds: Normal breath sounds.  Skin:    General: Skin is warm and dry.  Neurological:     Mental Status: She is alert and oriented to person, place, and time.      I have personally reviewed labs listed below:    Latest Ref Rng & Units 12/05/2023   11:11 AM  CMP  Glucose 70 - 99 mg/dL 99   BUN 8 - 23 mg/dL 14   Creatinine 9.55 - 1.00 mg/dL 9.23   Sodium 864 - 854 mmol/L 126   Potassium 3.5 - 5.1 mmol/L 3.5   Chloride 98 - 111 mmol/L 90   CO2 22 - 32 mmol/L 27   Calcium  8.9 - 10.3 mg/dL 8.9   Total Protein 6.5 - 8.1 g/dL 8.6   Total Bilirubin 0.0 - 1.2 mg/dL 0.6   Alkaline Phos 38 - 126 U/L 55   AST 15 - 41 U/L 14   ALT 0 - 44 U/L 9       Latest Ref Rng & Units 12/05/2023   11:10 AM  CBC  WBC 4.0 - 10.5 K/uL 4.1   Hemoglobin 12.0 - 15.0 g/dL 89.2   Hematocrit 63.9 - 46.0 % 32.5   Platelets 150 - 400 K/uL 181    I have personally reviewed Radiology images listed below: No images are attached to the encounter.  No results found.   Assessment and plan- Patient is a 89 y.o. female here for routine Follow-up of IgA lambda MGUS  Myeloma panel and serum free light chains from today are pending.  Levels checked 6 months ago showed stable M protein of 2 g IgA lambda.  Levels have remained stable over the last 3 years.  Serum free light chain ratio 6 months ago was normal.  CBC shows stable hemoglobin which has remained around 10-11.  White count and platelets are normal.  B12 levels checked 6 months ago were within normal limits.  I will repeat labs in 6 months and 1 year and see her back in 1 year  Chronic hyponatremia: This has been stable but low over the last 1 year.  I will reach out to her PCP Dr. Delfina about this.  Blood pressure is also uncontrolled   Visit Diagnosis 1. MGUS (monoclonal  gammopathy of unknown significance)      Dr. Annah Skene, MD, MPH [facility] [phone] 12/05/2023 1:01 PM

## 2023-12-05 NOTE — Progress Notes (Signed)
 Ache in back and across neck; pain 2/10.  Little bit of urinary incontinence.  Little bit of neuropathy at night.  Little bit of neuropathy at night. Was told circulation was bad by a [facility] RN, had appt with vein doctor and was told she did not have a problem with circulation at all. Patient inquired if B12 was checked (it doesn't appear so).

## 2023-12-06 LAB — KAPPA/LAMBDA LIGHT CHAINS
Kappa free light chain: 17.1 mg/L (ref 3.3–19.4)
Kappa, lambda light chain ratio: 0.83 (ref 0.26–1.65)
Lambda free light chains: 20.6 mg/L (ref 5.7–26.3)

## 2023-12-08 LAB — MULTIPLE MYELOMA PANEL, SERUM
Albumin SerPl Elph-Mcnc: 3.2 g/dL (ref 2.9–4.4)
Albumin/Glob SerPl: 0.7 (ref 0.7–1.7)
Alpha 1: 0.2 g/dL (ref 0.0–0.4)
Alpha2 Glob SerPl Elph-Mcnc: 0.7 g/dL (ref 0.4–1.0)
B-Globulin SerPl Elph-Mcnc: 1.3 g/dL (ref 0.7–1.3)
Gamma Glob SerPl Elph-Mcnc: 2.7 g/dL — ABNORMAL HIGH (ref 0.4–1.8)
Globulin, Total: 5 g/dL — ABNORMAL HIGH (ref 2.2–3.9)
IgA: 2523 mg/dL — ABNORMAL HIGH (ref 64–422)
IgG (Immunoglobin G), Serum: 1049 mg/dL (ref 586–1602)
IgM (Immunoglobulin M), Srm: 32 mg/dL (ref 26–217)
M Protein SerPl Elph-Mcnc: 2.4 g/dL — ABNORMAL HIGH
Total Protein ELP: 8.2 g/dL (ref 6.0–8.5)

## 2024-06-03 ENCOUNTER — Other Ambulatory Visit

## 2024-12-04 ENCOUNTER — Ambulatory Visit: Admitting: Oncology

## 2024-12-04 ENCOUNTER — Other Ambulatory Visit
